# Patient Record
Sex: Male | Born: 1950 | Race: White | Hispanic: No | Smoking: Never smoker
Health system: Southern US, Community
[De-identification: ages and names within clinical notes are randomized; demographics above are authoritative.]

---

## 2020-09-06 ENCOUNTER — Emergency Department (HOSPITAL_COMMUNITY): Payer: Medicare Other

## 2020-09-06 ENCOUNTER — Inpatient Hospital Stay (HOSPITAL_COMMUNITY): Admission: EM | Disposition: E | Payer: Self-pay | Source: Home / Self Care | Attending: Cardiology

## 2020-09-06 ENCOUNTER — Inpatient Hospital Stay (HOSPITAL_COMMUNITY)
Admission: EM | Admit: 2020-09-06 | Discharge: 2020-10-19 | DRG: 270 | Disposition: E | Payer: Medicare Other | Attending: Cardiology | Admitting: Cardiology

## 2020-09-06 ENCOUNTER — Encounter (HOSPITAL_COMMUNITY): Payer: Self-pay

## 2020-09-06 ENCOUNTER — Inpatient Hospital Stay (HOSPITAL_COMMUNITY): Payer: Medicare Other

## 2020-09-06 ENCOUNTER — Other Ambulatory Visit: Payer: Self-pay

## 2020-09-06 DIAGNOSIS — I2109 ST elevation (STEMI) myocardial infarction involving other coronary artery of anterior wall: Secondary | ICD-10-CM | POA: Diagnosis not present

## 2020-09-06 DIAGNOSIS — J9602 Acute respiratory failure with hypercapnia: Secondary | ICD-10-CM | POA: Diagnosis not present

## 2020-09-06 DIAGNOSIS — I255 Ischemic cardiomyopathy: Secondary | ICD-10-CM | POA: Diagnosis present

## 2020-09-06 DIAGNOSIS — Z452 Encounter for adjustment and management of vascular access device: Secondary | ICD-10-CM | POA: Diagnosis not present

## 2020-09-06 DIAGNOSIS — I272 Pulmonary hypertension, unspecified: Secondary | ICD-10-CM | POA: Diagnosis not present

## 2020-09-06 DIAGNOSIS — R918 Other nonspecific abnormal finding of lung field: Secondary | ICD-10-CM | POA: Diagnosis not present

## 2020-09-06 DIAGNOSIS — G9340 Encephalopathy, unspecified: Secondary | ICD-10-CM | POA: Diagnosis not present

## 2020-09-06 DIAGNOSIS — E440 Moderate protein-calorie malnutrition: Secondary | ICD-10-CM | POA: Diagnosis not present

## 2020-09-06 DIAGNOSIS — J9601 Acute respiratory failure with hypoxia: Secondary | ICD-10-CM | POA: Diagnosis not present

## 2020-09-06 DIAGNOSIS — Z743 Need for continuous supervision: Secondary | ICD-10-CM | POA: Diagnosis not present

## 2020-09-06 DIAGNOSIS — J811 Chronic pulmonary edema: Secondary | ICD-10-CM | POA: Diagnosis not present

## 2020-09-06 DIAGNOSIS — I5021 Acute systolic (congestive) heart failure: Secondary | ICD-10-CM | POA: Diagnosis not present

## 2020-09-06 DIAGNOSIS — Z4659 Encounter for fitting and adjustment of other gastrointestinal appliance and device: Secondary | ICD-10-CM

## 2020-09-06 DIAGNOSIS — R0602 Shortness of breath: Secondary | ICD-10-CM | POA: Diagnosis not present

## 2020-09-06 DIAGNOSIS — G9341 Metabolic encephalopathy: Secondary | ICD-10-CM | POA: Diagnosis not present

## 2020-09-06 DIAGNOSIS — Z515 Encounter for palliative care: Secondary | ICD-10-CM | POA: Diagnosis not present

## 2020-09-06 DIAGNOSIS — Z978 Presence of other specified devices: Secondary | ICD-10-CM

## 2020-09-06 DIAGNOSIS — I4901 Ventricular fibrillation: Secondary | ICD-10-CM | POA: Diagnosis not present

## 2020-09-06 DIAGNOSIS — E785 Hyperlipidemia, unspecified: Secondary | ICD-10-CM | POA: Diagnosis not present

## 2020-09-06 DIAGNOSIS — I469 Cardiac arrest, cause unspecified: Secondary | ICD-10-CM

## 2020-09-06 DIAGNOSIS — R570 Cardiogenic shock: Secondary | ICD-10-CM | POA: Diagnosis not present

## 2020-09-06 DIAGNOSIS — I10 Essential (primary) hypertension: Secondary | ICD-10-CM | POA: Diagnosis present

## 2020-09-06 DIAGNOSIS — T17990A Other foreign object in respiratory tract, part unspecified in causing asphyxiation, initial encounter: Secondary | ICD-10-CM | POA: Diagnosis not present

## 2020-09-06 DIAGNOSIS — I462 Cardiac arrest due to underlying cardiac condition: Secondary | ICD-10-CM | POA: Diagnosis not present

## 2020-09-06 DIAGNOSIS — J14 Pneumonia due to Hemophilus influenzae: Secondary | ICD-10-CM | POA: Diagnosis not present

## 2020-09-06 DIAGNOSIS — Z20822 Contact with and (suspected) exposure to covid-19: Secondary | ICD-10-CM | POA: Diagnosis not present

## 2020-09-06 DIAGNOSIS — R739 Hyperglycemia, unspecified: Secondary | ICD-10-CM | POA: Diagnosis not present

## 2020-09-06 DIAGNOSIS — I499 Cardiac arrhythmia, unspecified: Secondary | ICD-10-CM | POA: Diagnosis not present

## 2020-09-06 DIAGNOSIS — I472 Ventricular tachycardia: Secondary | ICD-10-CM | POA: Diagnosis present

## 2020-09-06 DIAGNOSIS — R079 Chest pain, unspecified: Secondary | ICD-10-CM | POA: Diagnosis not present

## 2020-09-06 DIAGNOSIS — J9811 Atelectasis: Secondary | ICD-10-CM | POA: Diagnosis not present

## 2020-09-06 DIAGNOSIS — I509 Heart failure, unspecified: Secondary | ICD-10-CM

## 2020-09-06 DIAGNOSIS — J189 Pneumonia, unspecified organism: Secondary | ICD-10-CM

## 2020-09-06 DIAGNOSIS — R062 Wheezing: Secondary | ICD-10-CM | POA: Diagnosis not present

## 2020-09-06 DIAGNOSIS — E872 Acidosis: Secondary | ICD-10-CM | POA: Diagnosis not present

## 2020-09-06 DIAGNOSIS — R0902 Hypoxemia: Secondary | ICD-10-CM | POA: Diagnosis not present

## 2020-09-06 DIAGNOSIS — I110 Hypertensive heart disease with heart failure: Secondary | ICD-10-CM | POA: Diagnosis present

## 2020-09-06 DIAGNOSIS — R001 Bradycardia, unspecified: Secondary | ICD-10-CM | POA: Diagnosis not present

## 2020-09-06 DIAGNOSIS — Z09 Encounter for follow-up examination after completed treatment for conditions other than malignant neoplasm: Secondary | ICD-10-CM

## 2020-09-06 DIAGNOSIS — G931 Anoxic brain damage, not elsewhere classified: Secondary | ICD-10-CM | POA: Diagnosis present

## 2020-09-06 DIAGNOSIS — Z0189 Encounter for other specified special examinations: Secondary | ICD-10-CM

## 2020-09-06 DIAGNOSIS — R0789 Other chest pain: Secondary | ICD-10-CM | POA: Diagnosis not present

## 2020-09-06 DIAGNOSIS — R945 Abnormal results of liver function studies: Secondary | ICD-10-CM | POA: Diagnosis not present

## 2020-09-06 DIAGNOSIS — N179 Acute kidney failure, unspecified: Secondary | ICD-10-CM | POA: Diagnosis not present

## 2020-09-06 DIAGNOSIS — I517 Cardiomegaly: Secondary | ICD-10-CM | POA: Diagnosis not present

## 2020-09-06 DIAGNOSIS — R410 Disorientation, unspecified: Secondary | ICD-10-CM | POA: Diagnosis not present

## 2020-09-06 DIAGNOSIS — E870 Hyperosmolality and hypernatremia: Secondary | ICD-10-CM | POA: Diagnosis not present

## 2020-09-06 DIAGNOSIS — Z4682 Encounter for fitting and adjustment of non-vascular catheter: Secondary | ICD-10-CM | POA: Diagnosis not present

## 2020-09-06 DIAGNOSIS — R21 Rash and other nonspecific skin eruption: Secondary | ICD-10-CM | POA: Diagnosis not present

## 2020-09-06 DIAGNOSIS — R34 Anuria and oliguria: Secondary | ICD-10-CM | POA: Diagnosis not present

## 2020-09-06 DIAGNOSIS — N171 Acute kidney failure with acute cortical necrosis: Secondary | ICD-10-CM | POA: Diagnosis not present

## 2020-09-06 DIAGNOSIS — J90 Pleural effusion, not elsewhere classified: Secondary | ICD-10-CM | POA: Diagnosis not present

## 2020-09-06 DIAGNOSIS — R0603 Acute respiratory distress: Secondary | ICD-10-CM | POA: Diagnosis not present

## 2020-09-06 DIAGNOSIS — I213 ST elevation (STEMI) myocardial infarction of unspecified site: Secondary | ICD-10-CM | POA: Diagnosis not present

## 2020-09-06 DIAGNOSIS — J984 Other disorders of lung: Secondary | ICD-10-CM | POA: Diagnosis not present

## 2020-09-06 DIAGNOSIS — N170 Acute kidney failure with tubular necrosis: Secondary | ICD-10-CM | POA: Diagnosis not present

## 2020-09-06 DIAGNOSIS — G7281 Critical illness myopathy: Secondary | ICD-10-CM | POA: Diagnosis not present

## 2020-09-06 DIAGNOSIS — Z6826 Body mass index (BMI) 26.0-26.9, adult: Secondary | ICD-10-CM

## 2020-09-06 DIAGNOSIS — Z66 Do not resuscitate: Secondary | ICD-10-CM | POA: Diagnosis not present

## 2020-09-06 DIAGNOSIS — R6889 Other general symptoms and signs: Secondary | ICD-10-CM | POA: Diagnosis not present

## 2020-09-06 DIAGNOSIS — I2102 ST elevation (STEMI) myocardial infarction involving left anterior descending coronary artery: Secondary | ICD-10-CM | POA: Diagnosis present

## 2020-09-06 DIAGNOSIS — E876 Hypokalemia: Secondary | ICD-10-CM | POA: Diagnosis not present

## 2020-09-06 DIAGNOSIS — K5900 Constipation, unspecified: Secondary | ICD-10-CM | POA: Diagnosis not present

## 2020-09-06 DIAGNOSIS — I25110 Atherosclerotic heart disease of native coronary artery with unstable angina pectoris: Secondary | ICD-10-CM | POA: Diagnosis present

## 2020-09-06 DIAGNOSIS — R069 Unspecified abnormalities of breathing: Secondary | ICD-10-CM

## 2020-09-06 DIAGNOSIS — E162 Hypoglycemia, unspecified: Secondary | ICD-10-CM | POA: Diagnosis not present

## 2020-09-06 DIAGNOSIS — Z955 Presence of coronary angioplasty implant and graft: Secondary | ICD-10-CM | POA: Diagnosis not present

## 2020-09-06 HISTORY — PX: CORONARY/GRAFT ACUTE MI REVASCULARIZATION: CATH118305

## 2020-09-06 HISTORY — PX: IABP INSERTION: CATH118242

## 2020-09-06 HISTORY — PX: LEFT HEART CATH AND CORONARY ANGIOGRAPHY: CATH118249

## 2020-09-06 LAB — CBC WITH DIFFERENTIAL/PLATELET
Abs Immature Granulocytes: 0.02 10*3/uL (ref 0.00–0.07)
Basophils Absolute: 0.1 10*3/uL (ref 0.0–0.1)
Basophils Relative: 1 %
Eosinophils Absolute: 0.3 10*3/uL (ref 0.0–0.5)
Eosinophils Relative: 3 %
HCT: 38.3 % — ABNORMAL LOW (ref 39.0–52.0)
Hemoglobin: 13.8 g/dL (ref 13.0–17.0)
Immature Granulocytes: 0 %
Lymphocytes Relative: 40 %
Lymphs Abs: 3 10*3/uL (ref 0.7–4.0)
MCH: 34.3 pg — ABNORMAL HIGH (ref 26.0–34.0)
MCHC: 36 g/dL (ref 30.0–36.0)
MCV: 95.3 fL (ref 80.0–100.0)
Monocytes Absolute: 0.7 10*3/uL (ref 0.1–1.0)
Monocytes Relative: 10 %
Neutro Abs: 3.3 10*3/uL (ref 1.7–7.7)
Neutrophils Relative %: 46 %
Platelets: 361 10*3/uL (ref 150–400)
RBC: 4.02 MIL/uL — ABNORMAL LOW (ref 4.22–5.81)
RDW: 13.8 % (ref 11.5–15.5)
WBC: 7.3 10*3/uL (ref 4.0–10.5)
nRBC: 0 % (ref 0.0–0.2)

## 2020-09-06 LAB — COMPREHENSIVE METABOLIC PANEL
ALT: 18 U/L (ref 0–44)
AST: 26 U/L (ref 15–41)
Albumin: 3.5 g/dL (ref 3.5–5.0)
Alkaline Phosphatase: 97 U/L (ref 38–126)
Anion gap: 9 (ref 5–15)
BUN: 13 mg/dL (ref 8–23)
CO2: 22 mmol/L (ref 22–32)
Calcium: 8.2 mg/dL — ABNORMAL LOW (ref 8.9–10.3)
Chloride: 107 mmol/L (ref 98–111)
Creatinine, Ser: 1.21 mg/dL (ref 0.61–1.24)
GFR, Estimated: >60 mL/min (ref >60)
Glucose, Bld: 127 mg/dL — ABNORMAL HIGH (ref 70–99)
Potassium: 3.2 mmol/L — ABNORMAL LOW (ref 3.5–5.1)
Sodium: 138 mmol/L (ref 135–145)
Total Bilirubin: 0.6 mg/dL (ref 0.3–1.2)
Total Protein: 6.3 g/dL — ABNORMAL LOW (ref 6.5–8.1)

## 2020-09-06 LAB — TSH: TSH: 2.235 u[IU]/mL (ref 0.350–4.500)

## 2020-09-06 LAB — LIPID PANEL
Cholesterol: 182 mg/dL (ref 0–200)
HDL: 31 mg/dL — ABNORMAL LOW (ref >40)
LDL Cholesterol: 106 mg/dL — ABNORMAL HIGH (ref 0–99)
Total CHOL/HDL Ratio: 5.9 RATIO
Triglycerides: 225 mg/dL — ABNORMAL HIGH (ref <150)
VLDL: 45 mg/dL — ABNORMAL HIGH (ref 0–40)

## 2020-09-06 LAB — TROPONIN I (HIGH SENSITIVITY)
Troponin I (High Sensitivity): 29 ng/L — ABNORMAL HIGH (ref <18)
Troponin I (High Sensitivity): >24000 ng/L (ref <18)

## 2020-09-06 LAB — PROTIME-INR
INR: 1.1 (ref 0.8–1.2)
Prothrombin Time: 14.2 seconds (ref 11.4–15.2)

## 2020-09-06 LAB — APTT: aPTT: 84 seconds — ABNORMAL HIGH (ref 24–36)

## 2020-09-06 LAB — MAGNESIUM: Magnesium: 2.2 mg/dL (ref 1.7–2.4)

## 2020-09-06 SURGERY — CORONARY/GRAFT ACUTE MI REVASCULARIZATION
Anesthesia: LOCAL

## 2020-09-06 MED ORDER — EPINEPHRINE HCL 5 MG/250ML IV SOLN IN NS
0.5000 ug/min | INTRAVENOUS | Status: DC
  Administered 2020-09-06: 5 ug/min via INTRAVENOUS
  Filled 2020-09-06 (×2): qty 250

## 2020-09-06 MED ORDER — SODIUM CHLORIDE 0.9 % IV SOLN: 250.0000 mL | INTRAVENOUS | Status: DC

## 2020-09-06 MED ORDER — LIDOCAINE HCL (PF) 1 % IJ SOLN
INTRAMUSCULAR | Status: DC | PRN
  Administered 2020-09-06: 2 mL via INTRADERMAL
  Administered 2020-09-06: 30 mL via INTRADERMAL

## 2020-09-06 MED ORDER — LIDOCAINE HCL (PF) 1 % IJ SOLN
INTRAMUSCULAR | Status: AC
  Filled 2020-09-06: qty 30

## 2020-09-06 MED ORDER — TICAGRELOR 90 MG PO TABS
ORAL_TABLET | ORAL | Status: AC
  Filled 2020-09-06: qty 2

## 2020-09-06 MED ORDER — CHLORHEXIDINE GLUCONATE CLOTH 2 % EX PADS
6.0000 | MEDICATED_PAD | Freq: Every day | CUTANEOUS | Status: DC
  Administered 2020-09-06 – 2020-09-16 (×11): 6 via TOPICAL

## 2020-09-06 MED ORDER — ONDANSETRON HCL 4 MG/2ML IJ SOLN
4.0000 mg | Freq: Four times a day (QID) | INTRAMUSCULAR | Status: DC | PRN
  Administered 2020-09-07: 4 mg via INTRAVENOUS
  Filled 2020-09-06: qty 2

## 2020-09-06 MED ORDER — HEPARIN (PORCINE) 25000 UT/250ML-% IV SOLN: 12.0000 [IU]/kg/h | INTRAVENOUS | Status: DC

## 2020-09-06 MED ORDER — TICAGRELOR 90 MG PO TABS
ORAL_TABLET | ORAL | Status: DC | PRN
  Administered 2020-09-06: 180 mg via ORAL

## 2020-09-06 MED ORDER — ATORVASTATIN CALCIUM 80 MG PO TABS
80.0000 mg | ORAL_TABLET | Freq: Every day | ORAL | Status: DC
  Administered 2020-09-07: 80 mg via ORAL
  Filled 2020-09-06: qty 1

## 2020-09-06 MED ORDER — HEPARIN (PORCINE) IN NACL 1000-0.9 UT/500ML-% IV SOLN
INTRAVENOUS | Status: AC
  Filled 2020-09-06: qty 500

## 2020-09-06 MED ORDER — SODIUM CHLORIDE 0.9 % IV SOLN: 250.0000 mL | INTRAVENOUS | Status: DC | PRN

## 2020-09-06 MED ORDER — VERAPAMIL HCL 2.5 MG/ML IV SOLN
INTRAVENOUS | Status: DC | PRN
  Administered 2020-09-06: 10 mL via INTRA_ARTERIAL

## 2020-09-06 MED ORDER — ASPIRIN 81 MG PO CHEW
81.0000 mg | CHEWABLE_TABLET | Freq: Every day | ORAL | Status: DC
  Administered 2020-09-07: 81 mg via ORAL
  Filled 2020-09-06: qty 1

## 2020-09-06 MED ORDER — NITROGLYCERIN 1 MG/10 ML FOR IR/CATH LAB
INTRA_ARTERIAL | Status: AC
  Filled 2020-09-06: qty 10

## 2020-09-06 MED ORDER — HEPARIN (PORCINE) IN NACL 1000-0.9 UT/500ML-% IV SOLN
INTRAVENOUS | Status: DC | PRN
  Administered 2020-09-06 (×3): 500 mL

## 2020-09-06 MED ORDER — TIROFIBAN (AGGRASTAT) BOLUS VIA INFUSION
25.0000 ug/kg | Freq: Once | INTRAVENOUS | Status: DC
  Filled 2020-09-06: qty 46

## 2020-09-06 MED ORDER — HEPARIN SODIUM (PORCINE) 1000 UNIT/ML IJ SOLN
INTRAMUSCULAR | Status: DC | PRN
  Administered 2020-09-06: 8000 [IU] via INTRAVENOUS

## 2020-09-06 MED ORDER — AMIODARONE HCL 150 MG/3ML IV SOLN
INTRAVENOUS | Status: AC | PRN
  Administered 2020-09-06: 300 mg via INTRAVENOUS

## 2020-09-06 MED ORDER — HEPARIN SODIUM (PORCINE) 1000 UNIT/ML IJ SOLN
INTRAMUSCULAR | Status: AC
  Filled 2020-09-06: qty 1

## 2020-09-06 MED ORDER — ACETAMINOPHEN 325 MG PO TABS: 650.0000 mg | ORAL_TABLET | ORAL | Status: DC | PRN

## 2020-09-06 MED ORDER — VERAPAMIL HCL 2.5 MG/ML IV SOLN
INTRAVENOUS | Status: AC
  Filled 2020-09-06: qty 2

## 2020-09-06 MED ORDER — MIDAZOLAM HCL 2 MG/2ML IJ SOLN
INTRAMUSCULAR | Status: AC
  Filled 2020-09-06: qty 2

## 2020-09-06 MED ORDER — TIROFIBAN (AGGRASTAT) BOLUS VIA INFUSION
INTRAVENOUS | Status: DC | PRN
  Administered 2020-09-06: 2267.5 ug via INTRAVENOUS

## 2020-09-06 MED ORDER — SODIUM CHLORIDE 0.9 % IV SOLN: INTRAVENOUS | Status: DC

## 2020-09-06 MED ORDER — HEPARIN (PORCINE) IN NACL 1000-0.9 UT/500ML-% IV SOLN
INTRAVENOUS | Status: AC
  Filled 2020-09-06: qty 1000

## 2020-09-06 MED ORDER — OXYCODONE HCL 5 MG PO TABS: 5.0000 mg | ORAL_TABLET | ORAL | Status: DC | PRN

## 2020-09-06 MED ORDER — EPINEPHRINE 1 MG/10ML IJ SOSY
PREFILLED_SYRINGE | INTRAMUSCULAR | Status: AC | PRN
  Administered 2020-09-06: 1 mg via INTRAVENOUS

## 2020-09-06 MED ORDER — SODIUM BICARBONATE 8.4 % IV SOLN
INTRAVENOUS | Status: AC | PRN
  Administered 2020-09-06: 100 meq via INTRAVENOUS

## 2020-09-06 MED ORDER — NOREPINEPHRINE 4 MG/250ML-% IV SOLN
2.0000 ug/min | INTRAVENOUS | Status: DC
  Administered 2020-09-06: 1 ug/min via INTRAVENOUS

## 2020-09-06 MED ORDER — NOREPINEPHRINE 4 MG/250ML-% IV SOLN: 0.0000 ug/min | INTRAVENOUS | Status: DC

## 2020-09-06 MED ORDER — SODIUM CHLORIDE 0.9 % WEIGHT BASED INFUSION
1.0000 mL/kg/h | INTRAVENOUS | Status: AC
  Administered 2020-09-06 (×2): 1 mL/kg/h via INTRAVENOUS

## 2020-09-06 MED ORDER — NOREPINEPHRINE BITARTRATE 1 MG/ML IV SOLN
INTRAVENOUS | Status: AC | PRN
  Administered 2020-09-06: 10 ug/min via INTRAVENOUS

## 2020-09-06 MED ORDER — SODIUM CHLORIDE 0.9% FLUSH
3.0000 mL | INTRAVENOUS | Status: DC | PRN
  Administered 2020-09-21: 3 mL via INTRAVENOUS

## 2020-09-06 MED ORDER — SODIUM CHLORIDE 0.9% FLUSH
3.0000 mL | Freq: Two times a day (BID) | INTRAVENOUS | Status: DC
  Administered 2020-09-07 – 2020-09-20 (×18): 3 mL via INTRAVENOUS

## 2020-09-06 MED ORDER — TICAGRELOR 90 MG PO TABS
90.0000 mg | ORAL_TABLET | Freq: Two times a day (BID) | ORAL | Status: DC
  Administered 2020-09-07: 90 mg via ORAL
  Filled 2020-09-06: qty 1

## 2020-09-06 MED ORDER — POTASSIUM CHLORIDE ER 10 MEQ PO TBCR
20.0000 meq | EXTENDED_RELEASE_TABLET | ORAL | Status: AC
  Administered 2020-09-06 (×2): 20 meq via ORAL
  Filled 2020-09-06 (×4): qty 2

## 2020-09-06 MED ORDER — IOHEXOL 350 MG/ML SOLN
INTRAVENOUS | Status: DC | PRN
  Administered 2020-09-06: 115 mL via INTRA_ARTERIAL

## 2020-09-06 MED ORDER — HEPARIN (PORCINE) 25000 UT/250ML-% IV SOLN
1000.0000 [IU]/h | INTRAVENOUS | Status: DC
  Administered 2020-09-06: 1000 [IU]/h via INTRAVENOUS
  Filled 2020-09-06: qty 250

## 2020-09-06 MED ORDER — NITROGLYCERIN 0.4 MG SL SUBL
0.4000 mg | SUBLINGUAL_TABLET | SUBLINGUAL | Status: DC | PRN
  Administered 2020-09-07: 0.4 mg via SUBLINGUAL
  Filled 2020-09-06: qty 1

## 2020-09-06 MED ORDER — HEPARIN SODIUM (PORCINE) 5000 UNIT/ML IJ SOLN
60.0000 [IU]/kg | Freq: Once | INTRAMUSCULAR | Status: AC
  Administered 2020-09-06: 4000 [IU] via INTRAVENOUS

## 2020-09-06 MED ORDER — TIROFIBAN HCL IN NACL 5-0.9 MG/100ML-% IV SOLN
0.1500 ug/kg/min | INTRAVENOUS | Status: DC
  Filled 2020-09-06 (×4): qty 100

## 2020-09-06 MED ORDER — ZOLPIDEM TARTRATE 5 MG PO TABS
5.0000 mg | ORAL_TABLET | Freq: Every evening | ORAL | Status: DC | PRN
  Administered 2020-09-07: 5 mg via ORAL
  Filled 2020-09-06: qty 1

## 2020-09-06 MED ORDER — TIROFIBAN HCL IN NACL 5-0.9 MG/100ML-% IV SOLN
INTRAVENOUS | Status: AC | PRN
  Administered 2020-09-06: 0.15 ug/kg/min via INTRAVENOUS

## 2020-09-06 SURGICAL SUPPLY — 27 items
BALLN IABP SENSA PLUS 7.5F 40C (BALLOONS) ×2
BALLN SAPPHIRE 3.0X20 (BALLOONS) ×2
BALLOON IABP SENS PLUS 7.5F40C (BALLOONS) ×1 IMPLANT
BALLOON SAPPHIRE 3.0X20 (BALLOONS) ×1 IMPLANT
CATH INFINITI JR4 5F (CATHETERS) ×2 IMPLANT
CATH LAUNCHER 6FR JL3.5 (CATHETERS) ×2 IMPLANT
DEVICE RAD COMP TR BAND LRG (VASCULAR PRODUCTS) ×2 IMPLANT
ELECT DEFIB PAD ADLT CADENCE (PAD) ×2 IMPLANT
GLIDESHEATH SLEND A-KIT 6F 22G (SHEATH) ×2 IMPLANT
GUIDEWIRE INQWIRE 1.5J.035X260 (WIRE) ×1 IMPLANT
INQWIRE 1.5J .035X260CM (WIRE) ×2
KIT ENCORE 26 ADVANTAGE (KITS) ×2 IMPLANT
KIT HEART LEFT (KITS) ×2 IMPLANT
KIT MICROPUNCTURE NIT STIFF (SHEATH) ×2 IMPLANT
PACK CARDIAC CATHETERIZATION (CUSTOM PROCEDURE TRAY) ×2 IMPLANT
SHEATH PINNACLE 6F 10CM (SHEATH) ×2 IMPLANT
STENT SYNERGY XD 3.0X16 (Permanent Stent) ×1 IMPLANT
STENT SYNERGY XD 3.0X24 (Permanent Stent) ×1 IMPLANT
SYNERGY XD 3.0X16 (Permanent Stent) ×2 IMPLANT
SYNERGY XD 3.0X24 (Permanent Stent) ×2 IMPLANT
TRANSDUCER W/STOPCOCK (MISCELLANEOUS) ×2 IMPLANT
TUBING CIL FLEX 10 FLL-RA (TUBING) ×2 IMPLANT
VALVE COPILOT STAT (MISCELLANEOUS) ×2 IMPLANT
WIRE AMPLATZ SS-J .035X180CM (WIRE) IMPLANT
WIRE AMPLATZ SS-J .035X260CM (WIRE) ×2 IMPLANT
WIRE COUGAR XT STRL 190CM (WIRE) ×2 IMPLANT
WIRE EMERALD 3MM-J .035X150CM (WIRE) ×2 IMPLANT

## 2020-09-06 NOTE — Progress Notes (Signed)
ANTICOAGULATION CONSULT NOTE - Initial Consult  Pharmacy Consult for heparin Indication:  IABP  No Known Allergies  Patient Measurements: Height: 5\' 9"  (175.3 cm) Weight: 90.7 kg (200 lb) IBW/kg (Calculated) : 70.7 Heparin Dosing Weight: 90kg  Vital Signs: Temp: 97.6 F (36.4 C) (06/19 1556) Temp Source: Oral (06/19 1556) BP: 96/73 (06/19 1556) Pulse Rate: 85 (06/19 1556)  Labs: Recent Labs    09/14/20 1551  HGB 13.8  HCT 38.3*  PLT 361  APTT 84*  LABPROT 14.2  INR 1.1    CrCl cannot be calculated (No successful lab value found.).   Medical History: History reviewed. No pertinent past medical history.  Medications:  No medications prior to admission.    Assessment: 70 year old male presenting to [facility] with chest pain, code stemi initiated. Patient now s/p stent to LAD and IABP inserted for support. No anticoagulants prior to admit. CBC appears normal  Goal of Therapy:  Heparin level 0.2-0.5 units/ml Monitor platelets by anticoagulation protocol: Yes   Plan:  Start heparin infusion at 1000 units/hr Check anti-Xa level in 8 hours and daily while on heparin Continue to monitor H&H and platelets  [facility] PharmD., BCPS Clinical Pharmacist Sep 14, 2020 5:57 PM

## 2020-09-06 NOTE — ED Provider Notes (Signed)
[facility] EMERGENCY DEPARTMENT Provider Note   CSN: [id number] Arrival date & time: 09/18/20  1546     History Chief Complaint  Patient presents with   Code STEMI    Zacharee Gaddie is a 70 y.o. male.  The history is provided by the patient and the EMS personnel.  Chest Pain Pain location:  Substernal area Pain quality: crushing and pressure   Pain radiates to:  Does not radiate Pain severity:  Severe Onset quality:  Sudden Duration:  1 hour Timing:  Constant Progression:  Worsening Chronicity:  New Context: at rest   Context: not breathing, not drug use, not eating, not intercourse, not lifting, not movement, not raising an arm, not stress and not trauma   Relieved by:  Nitroglycerin Worsened by:  Nothing Ineffective treatments:  None tried Associated symptoms: nausea   Associated symptoms: no abdominal pain, no AICD problem, no altered mental status, no anorexia, no anxiety, no back pain, no claudication, no cough, no diaphoresis, no dizziness, no dysphagia, no fatigue, no fever, no headache, no heartburn, no lower extremity edema, no near-syncope, no numbness, no orthopnea, no palpitations, no PND, no shortness of breath, no syncope, no vomiting and no weakness   Risk factors: male sex   Risk factors: no aortic disease, no coronary artery disease, no diabetes mellitus, no high cholesterol, no hypertension, not obese and no prior DVT/PE    Arrived as CODE STEMI. Acute onset crushing CP about 1 hour ago. Was sitting watching TV. No pmhx reported. No medications reported. No allergies. EMS gave 325 ASA and 1 SL nitro with improvement in pain slightly.     History reviewed. No pertinent past medical history.  There are no problems to display for this patient.   History reviewed. No pertinent surgical history.     History reviewed. No pertinent family history.  Social History   Tobacco Use   Smoking status: Never   Smokeless tobacco: Never   Substance Use Topics   Alcohol use: Never   Drug use: Never    Home Medications Prior to Admission medications   Not on File    Allergies    Patient has no known allergies.  Review of Systems   Review of Systems  Constitutional:  Negative for chills, diaphoresis, fatigue and fever.  HENT:  Negative for ear pain, sore throat and trouble swallowing.   Eyes:  Negative for pain and visual disturbance.  Respiratory:  Negative for cough and shortness of breath.   Cardiovascular:  Positive for chest pain. Negative for palpitations, orthopnea, claudication, syncope, PND and near-syncope.  Gastrointestinal:  Positive for nausea. Negative for abdominal pain, anorexia, heartburn and vomiting.  Genitourinary:  Negative for dysuria and hematuria.  Musculoskeletal:  Negative for arthralgias and back pain.  Skin:  Negative for color change and rash.  Neurological:  Negative for dizziness, seizures, syncope, weakness, numbness and headaches.  All other systems reviewed and are negative.  Physical Exam Updated Vital Signs BP 96/73 (BP Location: Right Arm)   Pulse 85   Temp 97.6 F (36.4 C) (Oral)   Resp 18   Ht 5\' 9"  (1.753 m)   Wt 90.7 kg   SpO2 98%   BMI 29.53 kg/m   Physical Exam Vitals and nursing note reviewed.  Constitutional:      Appearance: He is well-developed and overweight. He is ill-appearing and toxic-appearing.     Interventions: Nasal cannula in place.  HENT:     Head: Normocephalic and atraumatic.  Eyes:     Extraocular Movements: Extraocular movements intact.     Conjunctiva/sclera: Conjunctivae normal.  Cardiovascular:     Rate and Rhythm: Normal rate and regular rhythm.     Pulses:          Radial pulses are 2+ on the right side and 2+ on the left side.       Dorsalis pedis pulses are 2+ on the right side and 2+ on the left side.     Heart sounds: Normal heart sounds. No murmur heard. Pulmonary:     Effort: Pulmonary effort is normal. No tachypnea,  accessory muscle usage or respiratory distress.     Breath sounds: Normal breath sounds. No decreased air movement.  Abdominal:     Palpations: Abdomen is soft.     Tenderness: There is no abdominal tenderness.  Musculoskeletal:     Cervical back: Neck supple.     Right lower leg: No edema.     Left lower leg: No edema.  Skin:    General: Skin is warm and moist.     Capillary Refill: Capillary refill takes less than 2 seconds.     Coloration: Skin is ashen and pale.  Neurological:     General: No focal deficit present.     Mental Status: He is alert and oriented to person, place, and time.     GCS: GCS eye subscore is 4. GCS verbal subscore is 5. GCS motor subscore is 6.    ED Results / Procedures / Treatments   Labs (all labs ordered are listed, but only abnormal results are displayed) Labs Reviewed  RESP PANEL BY RT-PCR (FLU A&B, COVID) ARPGX2  HEMOGLOBIN A1C  CBC WITH DIFFERENTIAL/PLATELET  PROTIME-INR  APTT  COMPREHENSIVE METABOLIC PANEL  LIPID PANEL  MAGNESIUM  TROPONIN I (HIGH SENSITIVITY)    EKG None  Radiology No results found.  Procedures Procedures   Medications Ordered in ED Medications  0.9 %  sodium chloride infusion (has no administration in time range)  nitroGLYCERIN (NITROSTAT) SL tablet 0.4 mg ( Sublingual MAR Hold 08/31/2020 1616)  tirofiban (AGGRASTAT) bolus via infusion 2,267.5 mcg ( Intravenous MAR Hold 09/15/2020 1616)  tirofiban (AGGRASTAT) infusion 50 mcg/mL 100 mL (has no administration in time range)  Heparin (Porcine) in NaCl 1000-0.9 UT/500ML-% SOLN (500 mLs  Given 09/14/2020 1624)  heparin sodium (porcine) injection (8,000 Units Intravenous Given 09/04/2020 1624)  EPINEPHrine (ADRENALIN) 5 mg in NS 250 mL (0.02 mg/mL) premix infusion (has no administration in time range)  lidocaine (PF) (XYLOCAINE) 1 % injection (2 mLs Intradermal Given 09/10/2020 1625)  Radial Cocktail/Verapamil only (10 mLs Intra-arterial Given 09/11/2020 1633)  heparin injection 60  Units/kg (4,000 Units Intravenous Given 08/29/2020 1550)  EPINEPHrine (ADRENALIN) 1 MG/10ML injection (1 mg Intravenous Given 09/04/2020 1559)  amiodarone (CORDARONE) injection (300 mg Intravenous Given 08/31/2020 1601)  sodium bicarbonate injection (100 mEq Intravenous Given 09/08/2020 1601)    ED Course  I have reviewed the triage vital signs and the nursing notes.  Pertinent labs & imaging results that were available during my care of the patient were reviewed by me and considered in my medical decision making (see chart for details).    MDM Rules/Calculators/A&P                          This is a 70 year old male with no reported past medical history who presented to the emergency department via EMS as a code STEMI after developing acute  onset chest pain while at rest approximately 1 hour prior to arrival.  EMS transmitted his EKG which was consistent with anterior lateral STEMI. He received 325 ASA as well as 1 sublingual nitroglycerin prior to arrival. In the emergency department he was initially hemodynamically stable though toxic appearing and pale.  He had a nonfocal neurologic exam and his GCS was 15.  He was given IV heparin bolus and started on heparin infusion. After a short period of time in the emergency department he had an acute change in mental status, lost pulses and monitor showed ventricular fibrillation. He was defibrillated and CPR was started. He received 1 round of CPR including 1 mg of epinephrine, then subsequently had ROSC.  Loaded with amiodarone.  Is mental status improved significantly.  He was able to talk and communicate with the team.  He was protecting his airway.  He was not hypoxic. Cardiology arrived to bedside.  He was started on epinephrine infusion.  Patient was transported rapidly to Cath Lab where he continued to have a GCS of 15 and remained hemodynamically stable. Handoff given to Cath Lab team by myself in person.   Final Clinical Impression(s) /  ED Diagnoses Final diagnoses:  ST elevation myocardial infarction (STEMI), unspecified artery [facility]  Cardiac arrest [facility])    Rx / DC Orders ED Discharge Orders     None        Ardeen Fillers, DO 09-29-20 1640    Margarita Grizzle, MD 09/07/20 0002

## 2020-09-06 NOTE — H&P (Signed)
CARDIOLOGY ADMIT NOTE   Patient ID: Jesse Lowe MRN: [id number] DOB/AGE: 03/25/1950 70 y.o.  Admit date: 09/03/2020 Primary Physician:  No primary care provider on file.  Patient ID: Jesse Lowe, male    DOB: May 03, 1950, 70 y.o.   MRN: [id number]  Chief Complaint  Patient presents with   Code STEMI   HPI:    [name]  is a 70 y.o. Caucasian male patient with no significant prior cardiovascular history, as noted he has not seen a physician in greater than 10 to 15 years.  He was doing well at home and suddenly developed severe crushing retrosternal chest pain 1 hour prior to admission to the emergency room, EMS was activated and brought in as a STEMI.  While patient in the emergency room, his intensity of chest pain got worse and suddenly had V. fib cardiac arrest needing resuscitation and ACLS protocol, received 300 mg of IV amiodarone, 1 ampoule of bicarbonate and also 1 ampoule of epinephrine.  After approximately 5 minutes of CPR, he returned to spontaneous respiration and was awake and alert.  He is now brought emergently to the cardiac catheterization lab for cardiac catheterization.  History reviewed. No pertinent past medical history. History reviewed. No pertinent surgical history. Social History   Socioeconomic History   Marital status: Not on file    Spouse name: Not on file   Number of children: Not on file   Years of education: Not on file   Highest education level: Not on file  Occupational History   Not on file  Tobacco Use   Smoking status: Never   Smokeless tobacco: Never  Substance and Sexual Activity   Alcohol use: Never   Drug use: Never   Sexual activity: Not on file  Other Topics Concern   Not on file  Social History Narrative   Not on file   Social Determinants of Health   Financial Resource Strain: Not on file  Food Insecurity: Not on file  Transportation Needs: Not on file  Physical Activity: Not on file  Stress: Not on file  Social  Connections: Not on file  Intimate Partner Violence: Not on file   History reviewed. No pertinent family history.   ROS  Review of Systems  Cardiovascular:  Positive for chest pain. Negative for dyspnea on exertion and leg swelling.  Gastrointestinal:  Negative for melena.  All other systems reviewed and are negative. Objective   Vitals with BMI 09/13/2020 08/25/2020  Height - 5\' 9"   Weight - 200 lbs  BMI - 29.52  Systolic 96 -  Diastolic 73 -  Pulse 85 -      Physical Exam Constitutional:      General: He is in acute distress.     Appearance: He is diaphoretic.  HENT:     Head: Atraumatic.     Mouth/Throat:     Mouth: Mucous membranes are moist.  Eyes:     Extraocular Movements: Extraocular movements intact.  Cardiovascular:     Rate and Rhythm: Tachycardia present.     Pulses:          Carotid pulses are 2+ on the right side and 2+ on the left side.      Femoral pulses are 2+ on the right side and 2+ on the left side.      Dorsalis pedis pulses are 0 on the right side and 0 on the left side.       Posterior tibial pulses are 0 on the right  side and 0 on the left side.     Heart sounds: Heart sounds are distant.     Comments: Cool periphery and no JVD Pulmonary:     Effort: Pulmonary effort is normal.     Breath sounds: Normal breath sounds.  Abdominal:     General: Abdomen is flat. Bowel sounds are normal.  Musculoskeletal:        General: No swelling.     Cervical back: Normal range of motion and neck supple.  Skin:    Capillary Refill: Capillary refill takes less than 2 seconds.     Coloration: Skin is ashen.  Neurological:     General: No focal deficit present.     Mental Status: He is alert and oriented to person, place, and time.   Laboratory examination:   Recent Labs    2020/09/15 1551  NA 138  K 3.2*  CL 107  CO2 22  GLUCOSE 127*  BUN 13  CREATININE 1.21  CALCIUM 8.2*  GFRNONAA >60   estimated creatinine clearance is 65 mL/min (by C-G  formula based on SCr of 1.21 mg/dL).  CMP Latest Ref Rng & Units 15-Sep-2020  Glucose 70 - 99 mg/dL 277(A)  BUN 8 - 23 mg/dL 13  Creatinine 1.28 - 7.86 mg/dL 7.67  Sodium 209 - 470 mmol/L 138  Potassium 3.5 - 5.1 mmol/L 3.2(L)  Chloride 98 - 111 mmol/L 107  CO2 22 - 32 mmol/L 22  Calcium 8.9 - 10.3 mg/dL 8.2(L)  Total Protein 6.5 - 8.1 g/dL 6.3(L)  Total Bilirubin 0.3 - 1.2 mg/dL 0.6  Alkaline Phos 38 - 126 U/L 97  AST 15 - 41 U/L 26  ALT 0 - 44 U/L 18   CBC Latest Ref Rng & Units 09-15-2020  WBC 4.0 - 10.5 K/uL 7.3  Hemoglobin 13.0 - 17.0 g/dL 96.2  Hematocrit 83.6 - 52.0 % 38.3(L)  Platelets 150 - 400 K/uL 361   Lipid Panel  No results found for: CHOL, TRIG, HDL, CHOLHDL, VLDL, LDLCALC, LDLDIRECT HEMOGLOBIN A1C No results found for: HGBA1C, MPG TSH No results for input(s): TSH in the last 8760 hours. BNP (last 3 results) No results for input(s): BNP in the last 8760 hours.  Medications and allergies  No Known Allergies   Prior to Admission medications   Not on File  None   sodium chloride     sodium chloride     epinephrine Stopped (09-15-20 1650)   heparin     norepinephrine (LEVOPHED) 4mg  / infusion 5 mcg/min (September 15, 2020 1710)   norepinephrine (LEVOPHED) Adult infusion     tirofiban     tirofiban 0.15 mcg/kg/min (15-Sep-2020 1647)   No current outpatient medications  Radiology:  No results found. CXR pending  Cardiac Studies:   EKG 2020/09/15: 1557 hrs.: Ventricular fibrillation.  EKG 2020/09/15 at 1550 hrs.: Anteroseptal and high lateral STEMI.   Assessment  1.  Acute anterior and lateral STEMI, ST elevation MI. 2.  V. fib arrest 3.  Cardiogenic shock  Recommendations:   Patient was stabilized in the emergency room, I was present during the CPR and assisted with the team, after stabilization he was emergently being rushed to the cardiac catheterization lab for further management of cardiogenic shock, anterior STEMI.  Rhythm has been stabilized.  I  spent a total of 20 to 25 minutes for stabilization of the patient during the entire transfer and in the cardiac catheterization lab prior to procedure.   09/08/2020, MD, Physician [facility] 09/15/20, 5:40 PM  Office: [phone] Fax: [phone] Pager: [phone]

## 2020-09-06 NOTE — Progress Notes (Signed)
1815- Patient admitted to 2H from Cath lab, report received from Cath lab team.  Monitors applied, IABP in place.  CHG bath performed, skin intact, no wounds noted. Knee immobilizer placed on right leg.

## 2020-09-06 NOTE — Code Documentation (Signed)
Shocked at 200 J

## 2020-09-06 NOTE — Code Documentation (Signed)
Pulse check, no pulse.  

## 2020-09-06 NOTE — ED Triage Notes (Signed)
Pt BIB [facility] from home w/acute onset of chest pain. Denies N/V, SOB, dizziness. No cardiac hx   Pt arrives pale and clammy  18G LAC   BP 130/84 HR 70 95% RA

## 2020-09-07 ENCOUNTER — Inpatient Hospital Stay (HOSPITAL_COMMUNITY): Payer: Medicare Other

## 2020-09-07 ENCOUNTER — Other Ambulatory Visit (HOSPITAL_COMMUNITY): Payer: Self-pay

## 2020-09-07 ENCOUNTER — Encounter (HOSPITAL_COMMUNITY): Payer: Self-pay | Admitting: Cardiology

## 2020-09-07 DIAGNOSIS — I2102 ST elevation (STEMI) myocardial infarction involving left anterior descending coronary artery: Secondary | ICD-10-CM

## 2020-09-07 DIAGNOSIS — N179 Acute kidney failure, unspecified: Secondary | ICD-10-CM

## 2020-09-07 DIAGNOSIS — G9341 Metabolic encephalopathy: Secondary | ICD-10-CM

## 2020-09-07 LAB — CBC
HCT: 36.6 % — ABNORMAL LOW (ref 39.0–52.0)
Hemoglobin: 12.9 g/dL — ABNORMAL LOW (ref 13.0–17.0)
MCH: 32.1 pg (ref 26.0–34.0)
MCHC: 35.2 g/dL (ref 30.0–36.0)
MCV: 91 fL (ref 80.0–100.0)
Platelets: 324 10*3/uL (ref 150–400)
RBC: 4.02 MIL/uL — ABNORMAL LOW (ref 4.22–5.81)
RDW: 13.7 % (ref 11.5–15.5)
WBC: 10 10*3/uL (ref 4.0–10.5)
nRBC: 0 % (ref 0.0–0.2)

## 2020-09-07 LAB — POCT I-STAT 7, (LYTES, BLD GAS, ICA,H+H)
Acid-base deficit: 5 mmol/L — ABNORMAL HIGH (ref 0.0–2.0)
Acid-base deficit: 6 mmol/L — ABNORMAL HIGH (ref 0.0–2.0)
Bicarbonate: 16.4 mmol/L — ABNORMAL LOW (ref 20.0–28.0)
Bicarbonate: 17.5 mmol/L — ABNORMAL LOW (ref 20.0–28.0)
Calcium, Ion: 1.12 mmol/L — ABNORMAL LOW (ref 1.15–1.40)
Calcium, Ion: 1.13 mmol/L — ABNORMAL LOW (ref 1.15–1.40)
HCT: 37 % — ABNORMAL LOW (ref 39.0–52.0)
HCT: 38 % — ABNORMAL LOW (ref 39.0–52.0)
Hemoglobin: 12.6 g/dL — ABNORMAL LOW (ref 13.0–17.0)
Hemoglobin: 12.9 g/dL — ABNORMAL LOW (ref 13.0–17.0)
O2 Saturation: 90 %
O2 Saturation: 95 %
Patient temperature: 98.2
Potassium: 3.6 mmol/L (ref 3.5–5.1)
Potassium: 3.7 mmol/L (ref 3.5–5.1)
Sodium: 140 mmol/L (ref 135–145)
Sodium: 141 mmol/L (ref 135–145)
TCO2: 17 mmol/L — ABNORMAL LOW (ref 22–32)
TCO2: 18 mmol/L — ABNORMAL LOW (ref 22–32)
pCO2 arterial: 21.8 mmHg — ABNORMAL LOW (ref 32.0–48.0)
pCO2 arterial: 27.4 mmHg — ABNORMAL LOW (ref 32.0–48.0)
pH, Arterial: 7.484 — ABNORMAL HIGH (ref 7.350–7.450)
pH, Arterial: 7.414 (ref 7.350–7.450)
pO2, Arterial: 52 mmHg — ABNORMAL LOW (ref 83.0–108.0)
pO2, Arterial: 75 mmHg — ABNORMAL LOW (ref 83.0–108.0)

## 2020-09-07 LAB — PROTIME-INR
INR: 1.2 (ref 0.8–1.2)
Prothrombin Time: 14.8 seconds (ref 11.4–15.2)

## 2020-09-07 LAB — GLUCOSE, CAPILLARY: Glucose-Capillary: 182 mg/dL — ABNORMAL HIGH (ref 70–99)

## 2020-09-07 LAB — LACTIC ACID, PLASMA
Lactic Acid, Venous: 3.4 mmol/L (ref 0.5–1.9)
Lactic Acid, Venous: 4.3 mmol/L (ref 0.5–1.9)

## 2020-09-07 LAB — COMPREHENSIVE METABOLIC PANEL
ALT: 143 U/L — ABNORMAL HIGH (ref 0–44)
AST: 683 U/L — ABNORMAL HIGH (ref 15–41)
Albumin: 3.1 g/dL — ABNORMAL LOW (ref 3.5–5.0)
Alkaline Phosphatase: 85 U/L (ref 38–126)
Anion gap: 10 (ref 5–15)
BUN: 15 mg/dL (ref 8–23)
CO2: 20 mmol/L — ABNORMAL LOW (ref 22–32)
Calcium: 8 mg/dL — ABNORMAL LOW (ref 8.9–10.3)
Chloride: 109 mmol/L (ref 98–111)
Creatinine, Ser: 1.26 mg/dL — ABNORMAL HIGH (ref 0.61–1.24)
GFR, Estimated: >60 mL/min (ref >60)
Glucose, Bld: 156 mg/dL — ABNORMAL HIGH (ref 70–99)
Potassium: 3.9 mmol/L (ref 3.5–5.1)
Sodium: 139 mmol/L (ref 135–145)
Total Bilirubin: 0.9 mg/dL (ref 0.3–1.2)
Total Protein: 5.7 g/dL — ABNORMAL LOW (ref 6.5–8.1)

## 2020-09-07 LAB — TROPONIN I (HIGH SENSITIVITY): Troponin I (High Sensitivity): >24000 ng/L (ref <18)

## 2020-09-07 LAB — BASIC METABOLIC PANEL
Anion gap: 8 (ref 5–15)
BUN: 14 mg/dL (ref 8–23)
CO2: 21 mmol/L — ABNORMAL LOW (ref 22–32)
Calcium: 8.1 mg/dL — ABNORMAL LOW (ref 8.9–10.3)
Chloride: 110 mmol/L (ref 98–111)
Creatinine, Ser: 1.29 mg/dL — ABNORMAL HIGH (ref 0.61–1.24)
GFR, Estimated: >60 mL/min (ref >60)
Glucose, Bld: 175 mg/dL — ABNORMAL HIGH (ref 70–99)
Potassium: 3.8 mmol/L (ref 3.5–5.1)
Sodium: 139 mmol/L (ref 135–145)

## 2020-09-07 LAB — COOXEMETRY PANEL
Carboxyhemoglobin: 0.6 % (ref 0.5–1.5)
Methemoglobin: 1.1 % (ref 0.0–1.5)
O2 Saturation: 61.6 %
Total hemoglobin: 13.8 g/dL (ref 12.0–16.0)

## 2020-09-07 LAB — HEPARIN LEVEL (UNFRACTIONATED): Heparin Unfractionated: 0.27 IU/mL — ABNORMAL LOW (ref 0.30–0.70)

## 2020-09-07 LAB — MAGNESIUM: Magnesium: 1.9 mg/dL (ref 1.7–2.4)

## 2020-09-07 LAB — ECHOCARDIOGRAM COMPLETE
Area-P 1/2: 6.27 cm2
Height: 69 in
Weight: 3200 oz

## 2020-09-07 LAB — BRAIN NATRIURETIC PEPTIDE: B Natriuretic Peptide: 192.7 pg/mL — ABNORMAL HIGH (ref 0.0–100.0)

## 2020-09-07 LAB — POCT ACTIVATED CLOTTING TIME: Activated Clotting Time: 208 seconds

## 2020-09-07 LAB — MRSA NEXT GEN BY PCR, NASAL: MRSA by PCR Next Gen: NOT DETECTED

## 2020-09-07 MED ORDER — PANTOPRAZOLE SODIUM 40 MG IV SOLR
40.0000 mg | Freq: Every day | INTRAVENOUS | Status: DC
  Administered 2020-09-07 – 2020-09-10 (×4): 40 mg via INTRAVENOUS
  Filled 2020-09-07 (×4): qty 40

## 2020-09-07 MED ORDER — FENTANYL 2500MCG IN NS 250ML (10MCG/ML) PREMIX INFUSION
25.0000 ug/h | INTRAVENOUS | Status: DC
  Administered 2020-09-07: 25 ug/h via INTRAVENOUS
  Administered 2020-09-09: 125 ug/h via INTRAVENOUS
  Filled 2020-09-07 (×3): qty 250

## 2020-09-07 MED ORDER — EPINEPHRINE HCL 5 MG/250ML IV SOLN IN NS
0.5000 ug/min | INTRAVENOUS | Status: DC
  Administered 2020-09-07: 2 ug/min via INTRAVENOUS

## 2020-09-07 MED ORDER — MIDAZOLAM HCL 2 MG/2ML IJ SOLN
1.0000 mg | INTRAMUSCULAR | Status: DC | PRN
  Administered 2020-09-07 – 2020-09-08 (×3): 1 mg via INTRAVENOUS
  Filled 2020-09-07 (×3): qty 2

## 2020-09-07 MED ORDER — FENTANYL CITRATE (PF) 100 MCG/2ML IJ SOLN
INTRAMUSCULAR | Status: AC
  Administered 2020-09-07: 100 ug via INTRAVENOUS
  Filled 2020-09-07: qty 2

## 2020-09-07 MED ORDER — DEXMEDETOMIDINE HCL IN NACL 400 MCG/100ML IV SOLN
0.4000 ug/kg/h | INTRAVENOUS | Status: DC
  Administered 2020-09-07: 0.8 ug/kg/h via INTRAVENOUS
  Administered 2020-09-07: 0.4 ug/kg/h via INTRAVENOUS

## 2020-09-07 MED ORDER — SODIUM CHLORIDE 0.9 % IV SOLN: INTRAVENOUS | Status: DC

## 2020-09-07 MED ORDER — FUROSEMIDE 10 MG/ML IJ SOLN
40.0000 mg | Freq: Once | INTRAMUSCULAR | Status: AC
  Administered 2020-09-07: 40 mg via INTRAVENOUS
  Filled 2020-09-07: qty 4

## 2020-09-07 MED ORDER — ASPIRIN 81 MG PO CHEW
81.0000 mg | CHEWABLE_TABLET | Freq: Every day | ORAL | Status: DC
  Administered 2020-09-08 – 2020-09-09 (×2): 81 mg
  Filled 2020-09-07 (×2): qty 1

## 2020-09-07 MED ORDER — DOCUSATE SODIUM 50 MG/5ML PO LIQD
100.0000 mg | Freq: Two times a day (BID) | ORAL | Status: DC
Start: 2020-09-07 — End: 2020-09-10
  Administered 2020-09-07 – 2020-09-09 (×5): 100 mg
  Filled 2020-09-07 (×5): qty 10

## 2020-09-07 MED ORDER — NITROGLYCERIN 2 % TD OINT
1.0000 [in_us] | TOPICAL_OINTMENT | Freq: Once | TRANSDERMAL | Status: DC
  Filled 2020-09-07: qty 30

## 2020-09-07 MED ORDER — HALOPERIDOL LACTATE 5 MG/ML IJ SOLN
INTRAMUSCULAR | Status: AC
  Administered 2020-09-07: 2 mg via INTRAVENOUS
  Filled 2020-09-07: qty 1

## 2020-09-07 MED ORDER — HALOPERIDOL LACTATE 5 MG/ML IJ SOLN
2.0000 mg | Freq: Once | INTRAMUSCULAR | Status: AC
  Administered 2020-09-07: 2 mg via INTRAVENOUS

## 2020-09-07 MED ORDER — ETOMIDATE 2 MG/ML IV SOLN: 20.0000 mg | Freq: Once | INTRAVENOUS | Status: AC

## 2020-09-07 MED ORDER — MIDAZOLAM HCL 2 MG/2ML IJ SOLN
1.0000 mg | INTRAMUSCULAR | Status: AC | PRN
  Administered 2020-09-07 (×3): 1 mg via INTRAVENOUS
  Filled 2020-09-07 (×3): qty 2

## 2020-09-07 MED ORDER — INSULIN ASPART 100 UNIT/ML IJ SOLN
0.0000 [IU] | INTRAMUSCULAR | Status: DC
  Administered 2020-09-07 – 2020-09-08 (×2): 3 [IU] via SUBCUTANEOUS
  Administered 2020-09-08 (×3): 2 [IU] via SUBCUTANEOUS
  Administered 2020-09-08: 3 [IU] via SUBCUTANEOUS
  Administered 2020-09-08 – 2020-09-09 (×2): 2 [IU] via SUBCUTANEOUS
  Administered 2020-09-09: 3 [IU] via SUBCUTANEOUS
  Administered 2020-09-09 – 2020-09-14 (×20): 2 [IU] via SUBCUTANEOUS
  Administered 2020-09-14: 3 [IU] via SUBCUTANEOUS
  Administered 2020-09-14 – 2020-09-16 (×8): 2 [IU] via SUBCUTANEOUS
  Administered 2020-09-16: 3 [IU] via SUBCUTANEOUS
  Administered 2020-09-16 – 2020-09-17 (×3): 2 [IU] via SUBCUTANEOUS
  Administered 2020-09-17 (×2): 3 [IU] via SUBCUTANEOUS
  Administered 2020-09-17: 2 [IU] via SUBCUTANEOUS
  Administered 2020-09-17 (×2): 3 [IU] via SUBCUTANEOUS
  Administered 2020-09-18: 2 [IU] via SUBCUTANEOUS
  Administered 2020-09-18 (×3): 3 [IU] via SUBCUTANEOUS
  Administered 2020-09-18: 2 [IU] via SUBCUTANEOUS
  Administered 2020-09-18 – 2020-09-19 (×2): 3 [IU] via SUBCUTANEOUS
  Administered 2020-09-19: 2 [IU] via SUBCUTANEOUS
  Administered 2020-09-19: 3 [IU] via SUBCUTANEOUS
  Administered 2020-09-19 – 2020-09-20 (×4): 2 [IU] via SUBCUTANEOUS
  Administered 2020-09-20: 3 [IU] via SUBCUTANEOUS
  Administered 2020-09-20 (×2): 2 [IU] via SUBCUTANEOUS
  Administered 2020-09-20: 3 [IU] via SUBCUTANEOUS
  Administered 2020-09-20 – 2020-09-21 (×3): 2 [IU] via SUBCUTANEOUS
  Administered 2020-09-21: 3 [IU] via SUBCUTANEOUS
  Administered 2020-09-21: 2 [IU] via SUBCUTANEOUS
  Administered 2020-09-21: 3 [IU] via SUBCUTANEOUS
  Administered 2020-09-22: 5 [IU] via SUBCUTANEOUS
  Administered 2020-09-22: 3 [IU] via SUBCUTANEOUS
  Administered 2020-09-22: 5 [IU] via SUBCUTANEOUS
  Administered 2020-09-22: 3 [IU] via SUBCUTANEOUS

## 2020-09-07 MED ORDER — CHLORHEXIDINE GLUCONATE 0.12% ORAL RINSE (MEDLINE KIT)
15.0000 mL | Freq: Two times a day (BID) | OROMUCOSAL | Status: DC
  Administered 2020-09-07 – 2020-09-10 (×5): 15 mL via OROMUCOSAL

## 2020-09-07 MED ORDER — ETOMIDATE 2 MG/ML IV SOLN
INTRAVENOUS | Status: AC
  Administered 2020-09-07: 20 mg via INTRAVENOUS
  Filled 2020-09-07: qty 10

## 2020-09-07 MED ORDER — COSYNTROPIN 0.25 MG IJ SOLR: 0.2500 mg | Freq: Once | INTRAMUSCULAR | Status: DC

## 2020-09-07 MED ORDER — POLYETHYLENE GLYCOL 3350 17 G PO PACK
17.0000 g | PACK | Freq: Every day | ORAL | Status: DC
Start: 2020-09-07 — End: 2020-09-11
  Administered 2020-09-07 – 2020-09-11 (×5): 17 g
  Filled 2020-09-07 (×5): qty 1

## 2020-09-07 MED ORDER — EPINEPHRINE HCL 5 MG/250ML IV SOLN IN NS
INTRAVENOUS | Status: AC
  Administered 2020-09-07: 2 mg
  Filled 2020-09-07: qty 250

## 2020-09-07 MED ORDER — MIDAZOLAM HCL 2 MG/2ML IJ SOLN: 1.0000 mg | Freq: Once | INTRAMUSCULAR | Status: AC

## 2020-09-07 MED ORDER — ROCURONIUM BROMIDE 50 MG/5ML IV SOLN
70.0000 mg | Freq: Once | INTRAVENOUS | Status: AC
  Filled 2020-09-07: qty 7

## 2020-09-07 MED ORDER — ATORVASTATIN CALCIUM 80 MG PO TABS
80.0000 mg | ORAL_TABLET | Freq: Every day | ORAL | Status: DC
  Administered 2020-09-08 – 2020-09-09 (×2): 80 mg
  Filled 2020-09-07 (×2): qty 1

## 2020-09-07 MED ORDER — FUROSEMIDE 10 MG/ML IJ SOLN
40.0000 mg | Freq: Two times a day (BID) | INTRAMUSCULAR | Status: AC
  Administered 2020-09-07 – 2020-09-08 (×2): 40 mg via INTRAVENOUS
  Filled 2020-09-07 (×2): qty 4

## 2020-09-07 MED ORDER — ORAL CARE MOUTH RINSE
15.0000 mL | OROMUCOSAL | Status: DC
  Administered 2020-09-07 – 2020-09-09 (×17): 15 mL via OROMUCOSAL

## 2020-09-07 MED ORDER — MIDAZOLAM HCL 2 MG/2ML IJ SOLN
INTRAMUSCULAR | Status: AC
  Administered 2020-09-07: 1 mg via INTRAVENOUS
  Filled 2020-09-07: qty 2

## 2020-09-07 MED ORDER — PERFLUTREN LIPID MICROSPHERE
1.0000 mL | INTRAVENOUS | Status: AC | PRN
  Administered 2020-09-07: 1 mL via INTRAVENOUS
  Filled 2020-09-07: qty 10

## 2020-09-07 MED ORDER — FENTANYL BOLUS VIA INFUSION
25.0000 ug | INTRAVENOUS | Status: DC | PRN
  Administered 2020-09-07 (×2): 50 ug via INTRAVENOUS
  Filled 2020-09-07: qty 100

## 2020-09-07 MED ORDER — NOREPINEPHRINE 4 MG/250ML-% IV SOLN
0.0000 ug/min | INTRAVENOUS | Status: DC
  Administered 2020-09-08: 2 ug/min via INTRAVENOUS
  Filled 2020-09-07 (×2): qty 250

## 2020-09-07 MED ORDER — FENTANYL CITRATE (PF) 100 MCG/2ML IJ SOLN
25.0000 ug | Freq: Once | INTRAMUSCULAR | Status: AC
Start: 2020-09-07 — End: 2020-09-07

## 2020-09-07 MED ORDER — FENTANYL CITRATE (PF) 100 MCG/2ML IJ SOLN: 50.0000 ug | Freq: Once | INTRAMUSCULAR | Status: DC

## 2020-09-07 MED ORDER — HALOPERIDOL LACTATE 5 MG/ML IJ SOLN: 2.0000 mg | Freq: Four times a day (QID) | INTRAMUSCULAR | Status: DC | PRN

## 2020-09-07 MED ORDER — SODIUM CHLORIDE 0.9 % IV SOLN: INTRAVENOUS | Status: DC | PRN

## 2020-09-07 MED ORDER — TICAGRELOR 90 MG PO TABS
90.0000 mg | ORAL_TABLET | Freq: Two times a day (BID) | ORAL | Status: DC
  Administered 2020-09-07 – 2020-09-09 (×5): 90 mg
  Filled 2020-09-07 (×5): qty 1

## 2020-09-07 MED ORDER — ROCURONIUM BROMIDE 10 MG/ML (PF) SYRINGE
PREFILLED_SYRINGE | INTRAVENOUS | Status: AC
  Administered 2020-09-07: 70 mg via INTRAVENOUS
  Filled 2020-09-07: qty 10

## 2020-09-07 MED ORDER — HEPARIN SODIUM (PORCINE) 5000 UNIT/ML IJ SOLN
5000.0000 [IU] | Freq: Three times a day (TID) | INTRAMUSCULAR | Status: DC
  Administered 2020-09-07 – 2020-09-08 (×2): 5000 [IU] via SUBCUTANEOUS
  Filled 2020-09-07 (×2): qty 1

## 2020-09-07 MED FILL — Midazolam HCl Inj 2 MG/2ML (Base Equivalent): INTRAMUSCULAR | Qty: 2 | Status: AC

## 2020-09-07 NOTE — Procedures (Signed)
Intubation Procedure Note  Jesse Lowe  [id number]  [birth year]  Date:09/07/20  Time:5:56 PM   Provider Performing:Analyssa Downs    Procedure: Intubation (31500)  Indication(s) Respiratory Failure  Consent Unable to obtain consent due to emergent nature of procedure.   Anesthesia Etomidate, Versed, Fentanyl, and Rocuronium   Time Out Verified patient identification, verified procedure, site/side was marked, verified correct patient position, special equipment/implants available, medications/allergies/relevant history reviewed, required imaging and test results available.   Sterile Technique Usual hand hygeine, masks, and gloves were used   Procedure Description Patient positioned in bed supine.  Sedation given as noted above.  Patient was intubated with endotracheal tube using Glidescope.  View was Grade 1 full glottis .  Number of attempts was 1.  Colorimetric CO2 detector was consistent with tracheal placement.   Complications/Tolerance None; patient tolerated the procedure well. Chest X-ray is ordered to verify placement.   EBL none  Lynnell Catalan, MD [facility] ICU Physician [facility]  Pager: [phone] Or Epic Secure Chat After hours: [phone].  09/07/2020, 5:57 PM

## 2020-09-07 NOTE — Progress Notes (Signed)
Post intubation ABG obtained on ventilator settings of VT: 560, RR: 24, FIO2: 100%, PEEP: 8.0.  Per MD, RR decreased to 20.  Will update vent order to correlate.  Tolerating well at this time.   Ref. Range 09/07/2020 18:14  Sample type Unknown ARTERIAL  pH, Arterial Latest Ref Range: 7.350 - 7.450  7.414  pCO2 arterial Latest Ref Range: 32.0 - 48.0 mmHg 27.4 (L)  pO2, Arterial Latest Ref Range: 83.0 - 108.0 mmHg 75 (L)  TCO2 Latest Ref Range: 22 - 32 mmol/L 18 (L)  Acid-base deficit Latest Ref Range: 0.0 - 2.0 mmol/L 6.0 (H)  Bicarbonate Latest Ref Range: 20.0 - 28.0 mmol/L 17.5 (L)  O2 Saturation Latest Units: % 95.0

## 2020-09-07 NOTE — TOC Benefit Eligibility Note (Signed)
Patient Product/process development scientist completed.    The patient is currently admitted and upon discharge could be taking Brilinta 90 mg.  The current 30 day co-pay is, $142.00 due to a $95.00 deductible, then it will be $47.00 a month.   The patient is insured through [employer]     Roland Earl, CPhT Pharmacy Patient Advocate Specialist [facility] Antimicrobial Stewardship Team Direct Number: [phone]  Fax: [phone]

## 2020-09-07 NOTE — Progress Notes (Signed)
ANTICOAGULATION CONSULT NOTE  Pharmacy Consult for heparin Indication:  IABP  No Known Allergies  Patient Measurements: Height: 5\' 9"  (175.3 cm) Weight: 90.7 kg (200 lb) IBW/kg (Calculated) : 70.7 Heparin Dosing Weight: 90kg  Vital Signs: Temp: 98 F (36.7 C) (06/20 0755) Temp Source: Oral (06/20 0755) BP: 93/70 (06/20 0800) Pulse Rate: 95 (06/20 0800)  Labs: Recent Labs    08/25/2020 1551 08/30/2020 1859 09/07/20 0137  HGB 13.8  --  12.9*  HCT 38.3*  --  36.6*  PLT 361  --  324  APTT 84*  --   --   LABPROT 14.2  --   --   INR 1.1  --   --   HEPARINUNFRC  --   --  0.27*  CREATININE 1.21  --  1.26*  TROPONINIHS 29* >24,000* >24,000*     Estimated Creatinine Clearance: 62.5 mL/min (A) (by C-G formula based on SCr of 1.26 mg/dL (H)).   Medical History: History reviewed. No pertinent past medical history.  Medications:  Medications Prior to Admission  Medication Sig Dispense Refill Last Dose   BIOTIN PO Take 1 tablet by mouth daily.   08/21/2020   Cyanocobalamin ([employer] B12 PO) Take 1 tablet by mouth daily.   09/07/2020   Pyridoxine HCl (VITAMIN B-6 PO) Take 1 tablet by mouth daily.   08/21/2020   VITAMIN D PO Take 1 tablet by mouth daily.   08/28/2020   VITAMIN E COMPLEX PO Take 1 tablet by mouth daily.   09/12/2020    Assessment: 70 year old male presenting to [facility] with chest pain, code stemi initiated. Patient now s/p stent to LAD and IABP inserted for support. No anticoagulants prior to admit. CBC appears normal.  Heparin level came back therapeutic at 0.27, on 1000 units/hr. Hgb 12.9, plt 324. Trop remains >24k. No s/sx of bleeding or infusion issues. IABP still running at 1:1.   Goal of Therapy:  Heparin level 0.2-0.5 units/ml Monitor platelets by anticoagulation protocol: Yes   Plan:  Heparin stopped for IABP pulled - no other indication for heparin post-IABP removal  [facility], PharmD, BCCCP Clinical Pharmacist  Phone: [phone] 09/07/2020 9:27  AM  Please check AMION for all [facility] Pharmacy phone numbers After 10:00 PM, call Main Pharmacy [phone]

## 2020-09-07 NOTE — Procedures (Signed)
Arterial Catheter Insertion Procedure Note  Amro Winebarger  [id number]  November 06, 1950  Date:09/07/20  Time:6:25 PM    Provider Performing: Elyn Peers    Procedure: Insertion of Arterial Line (41324) without US guidance  Indication(s) Blood pressure monitoring and/or need for frequent ABGs  Consent Unable to obtain consent due to emergent nature of procedure.  Anesthesia None   Time Out Verified patient identification, verified procedure, site/side was marked, verified correct patient position, special equipment/implants available, medications/allergies/relevant history reviewed, required imaging and test results available.   Sterile Technique Maximal sterile technique including full sterile barrier drape, hand hygiene, sterile gown, sterile gloves, mask, hair covering, sterile ultrasound probe cover (if used).   Procedure Description Area of catheter insertion was cleaned with chlorhexidine and draped in sterile fashion. Without real-time ultrasound guidance an arterial catheter was placed into the right radial artery.  Appropriate arterial tracings confirmed on monitor.     Complications/Tolerance None; patient tolerated the procedure well.   EBL Minimal   Specimen(s) None

## 2020-09-07 NOTE — Progress Notes (Signed)
Subjective:  Patient admitted to [facility] emergency department yesterday with STEMI followed by V. fib cardiac arrest, with return of spontaneous circulation after approximately 5 minutes of CPR.  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  Patient underwent successful stenting to the LAD as well as placement of IABP.   Upon further questioning today patient admits to history of high blood pressure and hyperlipidemia.  He denies other significant cardiovascular history or risk factors.   Patient seen and examined at approximately 8:15 AM today Patient seems mildly confused, nurse reports he became more confused and agitated last night following administration of Ambien. Levophed has been discontinued.  Blood pressure remains soft but has improved to approximately 113/73 mmHg at time of exam.  Intake/Output from previous day:  I/O last 3 completed shifts: In: 1628.4 [P.O.:300; I.V.:1328.4] Out: 200 [Urine:200] No intake/output data recorded.  Blood pressure 112/68, pulse 89, temperature 98.3 F (36.8 C), temperature source Oral, resp. rate 18, height 5' 9"  (1.753 m), weight 90.7 kg, SpO2 94 %. Physical Exam Vitals reviewed.  HENT:     Head: Normocephalic and atraumatic.  Cardiovascular:     Rate and Rhythm: Normal rate and regular rhythm.     Pulses: Intact distal pulses.     Heart sounds: S1 normal and S2 normal. Heart sounds are distant. No murmur heard.   No gallop.  Pulmonary:     Effort: Pulmonary effort is normal. No respiratory distress.     Breath sounds: No wheezing, rhonchi or rales.  Musculoskeletal:     Right lower leg: No edema.     Left lower leg: No edema.  Skin:    General: Skin is warm and dry.  Neurological:     Mental Status: He is alert.     Comments: Patient appears mildly confused. Oriented to self, place, time, and situation     Lab Results: BMP BNP (last 3 results) No results for input(s): BNP in the last 8760 hours.  ProBNP (last 3 results) No results for input(s): PROBNP in the last 8760 hours. BMP Latest Ref Rng & Units 09/07/2020 09/15/2020  Glucose 70 - 99 mg/dL 156(H) 127(H)  BUN 8 - 23 mg/dL 15 13  Creatinine 0.61 - 1.24 mg/dL 1.26(H) 1.21  Sodium 135 - 145 mmol/L 139 138  Potassium 3.5 - 5.1 mmol/L 3.9 3.2(L)  Chloride 98 - 111 mmol/L 109 107  CO2 22 - 32 mmol/L 20(L) 22  Calcium 8.9 - 10.3 mg/dL 8.0(L) 8.2(L)   Hepatic Function Latest Ref Rng & Units 09/07/2020 08/21/2020  Total Protein 6.5 - 8.1 g/dL 5.7(L) 6.3(L)  Albumin 3.5 - 5.0 g/dL 3.1(L) 3.5  AST 15 - 41 U/L 683(H) 26  ALT 0 - 44 U/L 143(H) 18  Alk Phosphatase 38 - 126 U/L 85 97  Total Bilirubin 0.3 - 1.2 mg/dL 0.9 0.6   CBC Latest Ref Rng & Units 09/07/2020 08/29/2020  WBC 4.0 - 10.5 K/uL 10.0 7.3  Hemoglobin 13.0 - 17.0 g/dL 12.9(L) 13.8  Hematocrit 39.0 - 52.0 % 36.6(L) 38.3(L)  Platelets 150 - 400 K/uL 324 361   Lipid Panel     Component Value Date/Time   CHOL 182 08/24/2020 1551   TRIG 225 (H) 09/17/2020 1551   HDL 31 (L) 09/01/2020 1551   CHOLHDL 5.9 09/12/2020 1551   VLDL 45 (H) 09/17/2020 1551   LDLCALC 106 (H) 09/13/2020 1551  Cardiac Panel (last 3 results) No results for input(s): CKTOTAL, CKMB, TROPONINI, RELINDX in the last 72 hours.  HEMOGLOBIN A1C No results found for: HGBA1C, MPG TSH Recent Labs    09/05/2020 1859  TSH 2.235   Imaging: DG CHEST X-ray 09/07/2020 VIEW COMPARISON:  09/12/2020  FINDINGS: Mild bilateral interstitial thickening likely reflecting mild interstitial edema. No focal consolidation. No pleural effusion or pneumothorax. Heart and mediastinal contours are unremarkable. Intra-aortic balloon pump is noted with the metallic tip at the level of the aortic arch unchanged from the prior exam. No acute osseous abnormality.  IMPRESSION: Intra-aortic balloon pump is noted with the metallic tip  at the level of the aortic arch unchanged from the prior exam.   DG Chest X-ray 08/21/2020:  Cardiac shadow is within normal limits. Prior coronary stenting is seen. Intra-aortic balloon pump is noted just below the aortic knob. Lungs are well aerated bilaterally with mild interstitial edema.  IMPRESSION: Mild interstitial edema. Balloon pump in satisfactory position.  Cardiac Studies: Left heart catheterization 09/04/2020: LV 56/15, EDP 21 mmHg.  Aorta 75/44, mean 57 mmHg.  No pressure gradient across the aortic valve.  Markedly elevated EDP. LM: Large vessel, bifurcates into circumflex and LAD. LAD: Has ulcerated subtotally occluded proximal LAD stenosis that extends to the mid segment.  There is TIMI I flow also in the D1 and D2.  Moderate-sized D1 and small sized D2.  Mild disease in the mid to distal LAD. CX: Moderate sized vessel, gives origin to a large OM1, OM 2 is very small, there is a lesion after OM 2 at 80% focal lesion.  OM 3 is moderate sized with secondary branches. RCA: Dominant.  Proximal segment 90% stenosis, tandem 30 to 40% stenosis in the midsegment followed by a high-grade 90% stenosis.  Mild disease in the distal right coronary.   Intervention: Successful 2 overlapping stent implantation with 3.0 x 24 mm followed by 3.0 x 16 mm Synergy XD stents, stenosis reduced from 99% to 0%, TIMI I improved to TIMI-3 flow.   Recommendation: Patient will need relook at the LAD stent in view of severe spasm and cardiogenic shock and need for pressor support in the cardiac catheterization lab.  Patient was started on Levophed due to low blood pressure and intra-aortic balloon pump was inserted percutaneously prior to angioplasty.  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  115 mL contrast utilized.  Echocardiogram 2/60/2022:   1. Left ventricular ejection fraction, by  estimation, is 20 to 25%. The  left ventricle has severely decreased function. The left ventricle  demonstrates regional wall motion abnormalities (see scoring  diagram/findings for description). Left ventricular  diastolic parameters were normal. There is akinesis of the left  ventricular, entire anterior wall, anterolateral wall and apical segment.   2. Right ventricular systolic function is normal. The right ventricular  size is normal. There is mildly elevated pulmonary artery systolic  pressure. The estimated right ventricular systolic pressure is 02.7 mmHg.   3. Left atrial size was mildly dilated.   4. The mitral valve is normal in structure. No evidence of mitral valve  regurgitation. No evidence of mitral stenosis.   5. The aortic valve is normal in structure. Aortic valve regurgitation is  not visualized. No aortic stenosis is present.  EKG:  08/29/2020: 1557 hrs.: Ventricular fibrillation. 09/11/2020 at 1550 hrs.: Anteroseptal and high lateral STEMI.  Telemetry: Sinus rhythm with runs of nonsustained ventricular tachycardia 6-8 beats   No results found for this or any previous visit (from the past 43800 hour(s)).  Scheduled Meds:  aspirin  81 mg Oral Daily   atorvastatin  80 mg Oral Daily   Chlorhexidine Gluconate Cloth  6 each Topical Daily   sodium chloride flush  3 mL Intravenous Q12H   ticagrelor  90 mg Oral BID   Continuous Infusions:  sodium chloride Stopped (09/07/20 0119)   sodium chloride     sodium chloride     sodium chloride 90 mL/hr at 09/07/20 0700   heparin 1,000 Units/hr (09/07/20 0700)   norepinephrine (LEVOPHED) Adult infusion Stopped (09/02/2020 2351)   PRN Meds:.sodium chloride, acetaminophen, nitroGLYCERIN, ondansetron (ZOFRAN) IV, oxyCODONE, sodium chloride flush, zolpidem  Assessment/Plan:   1.  Acute anterior and lateral STEMI, ST elevation MI. 2.  V. fib arrest - 08/21/2020  3.  Cardiogenic shock 4.  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  He is now off Levophed, however blood pressure remains soft.  Patient denies chest pain at this time.  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  We will continue to follow closely.  Will remove IABP and plan to initiate guideline directed medical therapy as tolerated.  We will hold off on initiating beta-blocker therapy for ACE/ARB given patient's soft blood pressure.  We will continue, Brilinta, and atorvastatin at this time.  Echocardiogram revealed LVEF of 20-25%.  We will therefore recommend initiation of guideline directed medical therapy for HFrEF when able from blood pressure standpoint.  Patient's renal function remains stable.   Will consider repeat cardiac catheterization for relook at the LAD as well as consideration for intervention to right and circumflex coronary artery when patient is more stable.  Patient was seen in collaboration with Dr. Einar Gip. He also reviewed patient's chart and examined the patient. Dr. Einar Gip is in agreement of the plan.      Alethia Berthold, PA-C 09/07/2020, 7:53 AM Office: [phone]

## 2020-09-07 NOTE — Progress Notes (Signed)
Called Dr Jacinto Halim in regards to the no U/O produced form patient and bladder scanner results showing . Gave verbal orders to restarted IVF at 60ml/kg and will reassess from morning BMP.

## 2020-09-07 NOTE — Progress Notes (Signed)
EKG CRITICAL VALUE     12 lead EKG performed.  Critical value noted.  Royston Bake, RN notified.   Edmonia Caprio, CCT 09/07/2020 8:13 AM

## 2020-09-07 NOTE — Progress Notes (Signed)
ABG ordered and obtained prior to bipap initiation.  Sample was obtained while patient was on 10L salter high-flow cannula.    Ref. Range 09/07/2020 15:33  Sample type Unknown ARTERIAL  pH, Arterial Latest Ref Range: 7.350 - 7.450  7.484 (H)  pCO2 arterial Latest Ref Range: 32.0 - 48.0 mmHg 21.8 (L)  pO2, Arterial Latest Ref Range: 83.0 - 108.0 mmHg 52 (L)  TCO2 Latest Ref Range: 22 - 32 mmol/L 17 (L)  Acid-base deficit Latest Ref Range: 0.0 - 2.0 mmol/L 5.0 (H)  Bicarbonate Latest Ref Range: 20.0 - 28.0 mmol/L 16.4 (L)  O2 Saturation Latest Units: % 90.0  Patient temperature Unknown 98.2 F  Collection site Unknown Radial

## 2020-09-07 NOTE — Progress Notes (Signed)
Pt tried to get out of bed d/t N/V. Pt had one vomiting episode. See MAR for intervention. Pt was re-oriented to call bell. And bed alarm active. Pt was in a sitting position on the side of the bed. RN ordered Chest Xray for IABP placement confirmation.

## 2020-09-07 NOTE — Progress Notes (Signed)
*  PRELIMINARY RESULTS* Echocardiogram 2D Echocardiogram with Definity has been performed.  Neomia Dear RDCS 09/07/2020, 8:17 AM

## 2020-09-07 NOTE — Progress Notes (Signed)
ANTICOAGULATION CONSULT NOTE Pharmacy Consult for heparin Indication:  IABP  No Known Allergies  Patient Measurements: Height: 5\' 9"  (175.3 cm) Weight: 90.7 kg (200 lb) IBW/kg (Calculated) : 70.7 Heparin Dosing Weight: 90kg  Vital Signs: Temp: 98.1 F (36.7 C) (06/20 0000) Temp Source: Oral (06/20 0000) BP: 113/64 (06/20 0200) Pulse Rate: 93 (06/20 0200)  Labs: Recent Labs    Sep 24, 2020 1551 September 24, 2020 1859 09/07/20 0137  HGB 13.8  --  12.9*  HCT 38.3*  --  36.6*  PLT 361  --  324  APTT 84*  --   --   LABPROT 14.2  --   --   INR 1.1  --   --   HEPARINUNFRC  --   --  0.27*  CREATININE 1.21  --   --   TROPONINIHS 29* >24,000*  --      Estimated Creatinine Clearance: 65 mL/min (by C-G formula based on SCr of 1.21 mg/dL).  Assessment: 69 y.o. male with STEMI s/p PCI, IABP, for heparin  Goal of Therapy:  Heparin level 0.2-0.5 units/ml Monitor platelets by anticoagulation protocol: Yes   Plan:  Continue Heparin at current rate   73, PharmD, BCPS  09/07/2020 2:45 AM

## 2020-09-07 NOTE — Progress Notes (Signed)
RT Note: Halodol given, placed on bipap per MD order. RT will continue to monitor.

## 2020-09-07 NOTE — Progress Notes (Signed)
Pt placed in mittens at this time d/t cont. Pulling at lines

## 2020-09-07 NOTE — Progress Notes (Signed)
Pt is becoming more restless and agitated. Pt is starting to become disoriented and talking out of his head. Pt is attempting to pull lines off.

## 2020-09-07 NOTE — Progress Notes (Signed)
IABP aspirated and removed from RFA. Manual pressure applied for 30 minutes. Site level 0 , no s+s of hematoma.  Throughout hold, patient could not lay still, does not seem to remember things, asking several times, how long this would take.  Tegaderm dressing applied, bedrest intructions given. Patient was able to repeat back instructions to me I.E. not moving his leg.    Bilateral dp and pt pulses easily palpable.    Bedrest begins at 12:00:00 noon.

## 2020-09-07 NOTE — Procedures (Signed)
Central Venous Catheter Insertion Procedure Note  Shalev Helminiak  [id number]  1951/02/27  Date:09/07/20  Time:5:54 PM   Provider Performing:Miasia Crabtree   Procedure: Insertion of Non-tunneled Central Venous Catheter(36556) with US guidance (99371)   Indication(s) Medication administration and Difficult access  Consent Unable to obtain consent due to emergent nature of procedure.  Anesthesia None, patient sedated for intubation  Timeout Verified patient identification, verified procedure, site/side was marked, verified correct patient position, special equipment/implants available, medications/allergies/relevant history reviewed, required imaging and test results available.  Sterile Technique Maximal sterile technique including full sterile barrier drape, hand hygiene, sterile gown, sterile gloves, mask, hair covering, sterile ultrasound probe cover (if used).  Procedure Description Area of catheter insertion was cleaned with chlorhexidine and draped in sterile fashion.  With real-time ultrasound guidance a 67F 20cm central venous catheter was placed into the left internal jugular vein. Nonpulsatile blood flow and easy flushing noted in all ports.  The catheter was sutured in place and sterile dressing applied.  Complications/Tolerance None; patient tolerated the procedure well. Chest X-ray is ordered to verify placement for internal jugular or subclavian cannulation.   Chest x-ray is not ordered for femoral cannulation.  EBL Minimal  Lynnell Catalan, MD [facility] ICU Physician [facility] [place] [facility]  Pager: [phone] Or Epic Secure Chat After hours: [phone].  09/07/2020, 5:55 PM

## 2020-09-07 NOTE — Consult Note (Addendum)
NAME:  Jesse Lowe, MRN:  [id number], DOB:  10-15-1950, LOS: 1 ADMISSION DATE:  08-Sep-2020, CONSULTATION DATE:  6/20 REFERRING MD:  Jacinto Halim, CHIEF COMPLAINT:  delirium and respiratory distress    History of Present Illness:  70 yr old male who was admitted 6/19 w/ cc: severe crushing CP. Code STEMI activated. While waiting had VF arrest req'd ACLS w/ defib, amio epi; estimated 5 minutes to ROSC. Was awake and alert after ROSC obtained. Went to cath lab emergently. Found to have lesion in LAD and high grade stenosis of the RCA and Circ. Underwent successful stent of LAD. And IAB placement. He was weaned off pressors. IABP dc'd am 6/20. Progressively more confused over course of day (had received Ambien that evening) 6/20 w/ increased WOB and agitation. PCCM consulted that afternoon for agitated delirium.    Pertinent  Medical History  HTN and HL Had not seen MD in 10-15 yrs.  Significant Hospital Events: Including procedures, antibiotic start and stop dates in addition to other pertinent events   6/19 admitted w/ STEMI. While waiting had VF arrest req'd ACLS w/ defib, amio epi; estimated 5 minutes to ROSC. Was awake and alert after ROSC obtained. Went to cath lab emergently. Found to have lesion in LAD and high grade stenosis of the RCA and Circ. Underwent successful stent of LAD. And IAB placement 6/20 off IABP. Pressors off. Progressive agitation and WOB. PCCM asked to consult   Interim History / Subjective:  Agitated. Restless but seems a little better after precedex started.   Objective   Blood pressure (Abnormal) 151/91, pulse (Abnormal) 101, temperature 98.2 F (36.8 C), temperature source Oral, resp. rate (Abnormal) 31, height 5\' 9"  (1.753 m), weight 90.7 kg, SpO2 98 %.    FiO2 (%):  [70 %] 70 %   Intake/Output Summary (Last 24 hours) at 09/07/2020 1626 Last data filed at 09/07/2020 1300 Gross per 24 hour  Intake 2428.66 ml  Output 400 ml  Net 2028.66 ml   Filed Weights    09-08-20 1552  Weight: 90.7 kg    Examination: General: 70 year old WM still agitated. Restless.  HENT: BIPAP mask in place. No clear JVD Lungs: RR 30s + accessory use. Audible rales Cardiovascular: tachy RRR Abdomen: soft not tender Extremities: cool, weak pulses.  Neuro: awake. Moves all ext. Agitated oriented but not able to reason with  GU: due to void  Labs/imaging that I havepersonally reviewed  (right click and "Reselect all SmartList Selections" daily)  See below   Resolved Hospital Problem list     Assessment & Plan:  Acute metabolic encephalopathy. Suspect that this is multi-factorial: drug induced 74) but also wonder could this be occult shock or hypoxia related.  Also consider ETOH w/d  Plan Precedex started Supportive care Serial neuro checks Dc ambien  May need to proceed w/ intubation given concern about additional cardiac demand of agitation.    Acute hypoxic respiratory failure w/ progressive pulmonary pulm edema  Plan Cont BIPAP IV lasix Ll prob need intubation  Acute systolic HF w/ EF 20-25%; S/p STEMI requiring stent to LAD, also has sig CAD involving the RCA and Circ  -echo w/ severe LV dysfxn. EF 20-25% Plan Cont tele  Asa, statin and brilinta  Ck lactate Cycle CEs Ck co-ox  AKI Plan Keep euvolemic Renal dose meds Strict I&O Serial chem  Hyperglycemia Plan SSI   Abnormal  LFTs Plan Trend LFTs   Best Practice (right click and "Reselect all SmartList Selections" daily)  Diet/type: NPO Pain/Anxiety/Delirium protocol RASS goal -1 VAP protocol (if indicated): Yes DVT prophylaxis: prophylactic heparin  GI prophylaxis: PPI Glucose control:  SSI Central venous access:  Yes, and it is still needed Arterial line:  Yes, and it is still needed Foley:  N/A Mobility:  bed rest  PT consulted: N/A Studies pending: None Culture data pending:none Last reviewed culture data:today Antibiotics:not indicated  Antibiotic  de-escalation: no,  continue current rx Stop date: N/A Daily labs: not indicated Code Status:  full code Last date of multidisciplinary goals of care discussion [pending ] ccm prognosis: Life-threating Disposition: remains critically ill, will stay in intensive care        Labs   CBC: Recent Labs  Lab 08/26/2020 1551 09/07/20 0137 09/07/20 1533  WBC 7.3 10.0  --   NEUTROABS 3.3  --   --   HGB 13.8 12.9* 12.6*  HCT 38.3* 36.6* 37.0*  MCV 95.3 91.0  --   PLT 361 324  --     Basic Metabolic Panel: Recent Labs  Lab 09/05/2020 1551 09/07/20 0137 09/07/20 1533  NA 138 139 140  K 3.2* 3.9 3.6  CL 107 109  --   CO2 22 20*  --   GLUCOSE 127* 156*  --   BUN 13 15  --   CREATININE 1.21 1.26*  --   CALCIUM 8.2* 8.0*  --   MG 2.2  --   --    GFR: Estimated Creatinine Clearance: 62.5 mL/min (A) (by C-G formula based on SCr of 1.26 mg/dL (H)). Recent Labs  Lab 09/14/2020 1551 09/07/20 0137  WBC 7.3 10.0    Liver Function Tests: Recent Labs  Lab 08/20/2020 1551 09/07/20 0137  AST 26 683*  ALT 18 143*  ALKPHOS 97 85  BILITOT 0.6 0.9  PROT 6.3* 5.7*  ALBUMIN 3.5 3.1*   No results for input(s): LIPASE, AMYLASE in the last 168 hours. No results for input(s): AMMONIA in the last 168 hours.  ABG    Component Value Date/Time   PHART 7.484 (H) 09/07/2020 1533   PCO2ART 21.8 (L) 09/07/2020 1533   PO2ART 52 (L) 09/07/2020 1533   HCO3 16.4 (L) 09/07/2020 1533   TCO2 17 (L) 09/07/2020 1533   ACIDBASEDEF 5.0 (H) 09/07/2020 1533   O2SAT 90.0 09/07/2020 1533     Coagulation Profile: Recent Labs  Lab 09/01/2020 1551  INR 1.1    Cardiac Enzymes: No results for input(s): CKTOTAL, CKMB, CKMBINDEX, TROPONINI in the last 168 hours.  HbA1C: No results found for: HGBA1C  CBG: No results for input(s): GLUCAP in the last 168 hours.  Review of Systems:   Unable   Past Medical History:  He,  has no past medical history on file.   Surgical History:   Past Surgical  History:  Procedure Laterality Date   CORONARY/GRAFT ACUTE MI REVASCULARIZATION N/A 08/28/2020   Procedure: Coronary/Graft Acute MI Revascularization;  Surgeon: Yates Decamp, MD;  Location: [facility] INVASIVE CV LAB;  Service: Cardiovascular;  Laterality: N/A;   IABP INSERTION N/A 08/25/2020   Procedure: IABP Insertion;  Surgeon: Yates Decamp, MD;  Location: [facility] INVASIVE CV LAB;  Service: Cardiovascular;  Laterality: N/A;   LEFT HEART CATH AND CORONARY ANGIOGRAPHY N/A 08/22/2020   Procedure: LEFT HEART CATH AND CORONARY ANGIOGRAPHY;  Surgeon: Yates Decamp, MD;  Location: [facility] INVASIVE CV LAB;  Service: Cardiovascular;  Laterality: N/A;     Social History:   reports that he has never smoked. He has never used smokeless tobacco. He  reports that he does not drink alcohol and does not use drugs.   Family History:  His family history is not on file.   Allergies No Known Allergies   Home Medications  Prior to Admission medications   Medication Sig Start Date End Date Taking? Authorizing Provider  BIOTIN PO Take 1 tablet by mouth daily.   Yes [provider]  Cyanocobalamin ([facility] B12 PO) Take 1 tablet by mouth daily.   Yes [provider]  Pyridoxine HCl (VITAMIN B-6 PO) Take 1 tablet by mouth daily.   Yes [provider]  VITAMIN D PO Take 1 tablet by mouth daily.   Yes [provider]  VITAMIN E COMPLEX PO Take 1 tablet by mouth daily.   Yes [provider]     Critical care time: 43 minutes.      Simonne Martinet ACNP-BC [facility] Pulmonary/Critical Care Pager # [phone] OR # [phone] if no answer

## 2020-09-08 DIAGNOSIS — G9340 Encephalopathy, unspecified: Secondary | ICD-10-CM | POA: Diagnosis not present

## 2020-09-08 DIAGNOSIS — J9601 Acute respiratory failure with hypoxia: Secondary | ICD-10-CM

## 2020-09-08 DIAGNOSIS — E440 Moderate protein-calorie malnutrition: Secondary | ICD-10-CM | POA: Insufficient documentation

## 2020-09-08 LAB — POCT I-STAT 7, (LYTES, BLD GAS, ICA,H+H)
Acid-base deficit: 2 mmol/L (ref 0.0–2.0)
Bicarbonate: 21.7 mmol/L (ref 20.0–28.0)
Calcium, Ion: 1.14 mmol/L — ABNORMAL LOW (ref 1.15–1.40)
HCT: 37 % — ABNORMAL LOW (ref 39.0–52.0)
Hemoglobin: 12.6 g/dL — ABNORMAL LOW (ref 13.0–17.0)
O2 Saturation: 97 %
Patient temperature: 100.7
Potassium: 3.7 mmol/L (ref 3.5–5.1)
Sodium: 140 mmol/L (ref 135–145)
TCO2: 23 mmol/L (ref 22–32)
pCO2 arterial: 34 mmHg (ref 32.0–48.0)
pH, Arterial: 7.417 (ref 7.350–7.450)
pO2, Arterial: 95 mmHg (ref 83.0–108.0)

## 2020-09-08 LAB — COMPREHENSIVE METABOLIC PANEL
ALT: 111 U/L — ABNORMAL HIGH (ref 0–44)
AST: 314 U/L — ABNORMAL HIGH (ref 15–41)
Albumin: 2.8 g/dL — ABNORMAL LOW (ref 3.5–5.0)
Alkaline Phosphatase: 77 U/L (ref 38–126)
Anion gap: 12 (ref 5–15)
BUN: 16 mg/dL (ref 8–23)
CO2: 21 mmol/L — ABNORMAL LOW (ref 22–32)
Calcium: 8 mg/dL — ABNORMAL LOW (ref 8.9–10.3)
Chloride: 107 mmol/L (ref 98–111)
Creatinine, Ser: 1.36 mg/dL — ABNORMAL HIGH (ref 0.61–1.24)
GFR, Estimated: 57 mL/min — ABNORMAL LOW (ref >60)
Glucose, Bld: 156 mg/dL — ABNORMAL HIGH (ref 70–99)
Potassium: 3.6 mmol/L (ref 3.5–5.1)
Sodium: 140 mmol/L (ref 135–145)
Total Bilirubin: 1.1 mg/dL (ref 0.3–1.2)
Total Protein: 5.7 g/dL — ABNORMAL LOW (ref 6.5–8.1)

## 2020-09-08 LAB — MAGNESIUM: Magnesium: 2 mg/dL (ref 1.7–2.4)

## 2020-09-08 LAB — COOXEMETRY PANEL
Carboxyhemoglobin: 0.8 % (ref 0.5–1.5)
Carboxyhemoglobin: 1 % (ref 0.5–1.5)
Methemoglobin: 1.1 % (ref 0.0–1.5)
Methemoglobin: 1.1 % (ref 0.0–1.5)
O2 Saturation: 74.1 %
O2 Saturation: 73.7 %
Total hemoglobin: 13.3 g/dL (ref 12.0–16.0)
Total hemoglobin: 12.7 g/dL (ref 12.0–16.0)

## 2020-09-08 LAB — LACTIC ACID, PLASMA: Lactic Acid, Venous: 3.4 mmol/L (ref 0.5–1.9)

## 2020-09-08 LAB — HEPARIN LEVEL (UNFRACTIONATED): Heparin Unfractionated: <0.1 IU/mL — ABNORMAL LOW (ref 0.30–0.70)

## 2020-09-08 LAB — GLUCOSE, CAPILLARY
Glucose-Capillary: 122 mg/dL — ABNORMAL HIGH (ref 70–99)
Glucose-Capillary: 122 mg/dL — ABNORMAL HIGH (ref 70–99)
Glucose-Capillary: 148 mg/dL — ABNORMAL HIGH (ref 70–99)
Glucose-Capillary: 156 mg/dL — ABNORMAL HIGH (ref 70–99)
Glucose-Capillary: 169 mg/dL — ABNORMAL HIGH (ref 70–99)
Glucose-Capillary: 171 mg/dL — ABNORMAL HIGH (ref 70–99)

## 2020-09-08 LAB — CBC
HCT: 38.5 % — ABNORMAL LOW (ref 39.0–52.0)
Hemoglobin: 13 g/dL (ref 13.0–17.0)
MCH: 30.9 pg (ref 26.0–34.0)
MCHC: 33.8 g/dL (ref 30.0–36.0)
MCV: 91.4 fL (ref 80.0–100.0)
Platelets: 329 10*3/uL (ref 150–400)
RBC: 4.21 MIL/uL — ABNORMAL LOW (ref 4.22–5.81)
RDW: 14.2 % (ref 11.5–15.5)
WBC: 14.5 10*3/uL — ABNORMAL HIGH (ref 4.0–10.5)
nRBC: 0 % (ref 0.0–0.2)

## 2020-09-08 LAB — POCT ACTIVATED CLOTTING TIME
Activated Clotting Time: 0 seconds
Activated Clotting Time: 126 seconds

## 2020-09-08 LAB — HEMOGLOBIN A1C
Hgb A1c MFr Bld: 5.5 % (ref 4.8–5.6)
Hgb A1c MFr Bld: 5.3 % (ref 4.8–5.6)
Mean Plasma Glucose: 111 mg/dL
Mean Plasma Glucose: 105 mg/dL

## 2020-09-08 MED ORDER — POTASSIUM CHLORIDE 20 MEQ PO PACK
40.0000 meq | PACK | Freq: Once | ORAL | Status: AC
  Administered 2020-09-08: 40 meq
  Filled 2020-09-08: qty 2

## 2020-09-08 MED ORDER — MILRINONE LACTATE IN DEXTROSE 20-5 MG/100ML-% IV SOLN
0.2500 ug/kg/min | INTRAVENOUS | Status: DC
  Administered 2020-09-08 – 2020-09-10 (×2): 0.125 ug/kg/min via INTRAVENOUS
  Filled 2020-09-08 (×2): qty 100

## 2020-09-08 MED ORDER — ACETAMINOPHEN 160 MG/5ML PO SOLN
650.0000 mg | ORAL | Status: DC | PRN
  Administered 2020-09-08 – 2020-09-09 (×3): 650 mg
  Filled 2020-09-08 (×3): qty 20.3

## 2020-09-08 MED ORDER — IVABRADINE HCL 7.5 MG PO TABS
7.5000 mg | ORAL_TABLET | Freq: Two times a day (BID) | ORAL | Status: DC
  Administered 2020-09-09: 7.5 mg
  Filled 2020-09-08 (×4): qty 1

## 2020-09-08 MED ORDER — FUROSEMIDE 10 MG/ML IJ SOLN: 40.0000 mg | Freq: Once | INTRAMUSCULAR | Status: DC

## 2020-09-08 MED ORDER — HEPARIN (PORCINE) 25000 UT/250ML-% IV SOLN
1500.0000 [IU]/h | INTRAVENOUS | Status: DC
  Administered 2020-09-08: 1000 [IU]/h via INTRAVENOUS
  Administered 2020-09-09: 1500 [IU]/h via INTRAVENOUS
  Filled 2020-09-08 (×2): qty 250

## 2020-09-08 MED ORDER — FUROSEMIDE 10 MG/ML IJ SOLN
80.0000 mg | Freq: Two times a day (BID) | INTRAMUSCULAR | Status: AC
  Administered 2020-09-08 – 2020-09-09 (×2): 80 mg via INTRAVENOUS
  Filled 2020-09-08 (×2): qty 8

## 2020-09-08 MED ORDER — QUETIAPINE FUMARATE 25 MG PO TABS
25.0000 mg | ORAL_TABLET | Freq: Every day | ORAL | Status: DC
  Administered 2020-09-08 – 2020-09-09 (×2): 25 mg
  Filled 2020-09-08 (×2): qty 1

## 2020-09-08 MED ORDER — VITAL HIGH PROTEIN PO LIQD
1000.0000 mL | ORAL | Status: DC
  Administered 2020-09-08: 1000 mL

## 2020-09-08 MED ORDER — PROSOURCE TF PO LIQD
45.0000 mL | Freq: Two times a day (BID) | ORAL | Status: DC
  Administered 2020-09-08 – 2020-09-09 (×3): 45 mL
  Filled 2020-09-08 (×3): qty 45

## 2020-09-08 MED ORDER — VITAL 1.5 CAL PO LIQD
1000.0000 mL | ORAL | Status: DC
  Administered 2020-09-08: 1000 mL

## 2020-09-08 MED ORDER — CHLORHEXIDINE GLUCONATE 0.12 % MT SOLN
OROMUCOSAL | Status: AC
  Administered 2020-09-08: 15 mL via OROMUCOSAL
  Filled 2020-09-08: qty 15

## 2020-09-08 MED FILL — Sodium Chloride IV Soln 0.9%: INTRAVENOUS | Qty: 250 | Status: AC

## 2020-09-08 MED FILL — Epinephrine Inj 30 MG/30ML (1 MG/ML) (1:1000): INTRAMUSCULAR | Qty: 5 | Status: AC

## 2020-09-08 NOTE — Consult Note (Signed)
NAME:  Jesse Lowe, MRN:  [id number], DOB:  October 09, 1950, LOS: 2 ADMISSION DATE:  2020-10-05, CONSULTATION DATE:  6/20 REFERRING MD:  Jacinto Halim, CHIEF COMPLAINT:  delirium and respiratory distress    History of Present Illness:  70 yr old male who was admitted 6/19 w/ cc: severe crushing CP. Code STEMI activated. While waiting had VF arrest req'd ACLS w/ defib, amio epi; estimated 5 minutes to ROSC. Was awake and alert after ROSC obtained. Went to cath lab emergently. Found to have lesion in LAD and high grade stenosis of the RCA and Circ. Underwent successful stent of LAD. And IAB placement. He was weaned off pressors. IABP dc'd am 6/20. Progressively more confused over course of day (had received Ambien that evening) 6/20 w/ increased WOB and agitation. PCCM consulted that afternoon for agitated delirium.    Pertinent  Medical History  HTN and HL Had not seen MD in 10-15 yrs.  Significant Hospital Events: Including procedures, antibiotic start and stop dates in addition to other pertinent events   6/19 admitted w/ STEMI. While waiting had VF arrest req'd ACLS w/ defib, amio epi; estimated 5 minutes to ROSC. Was awake and alert after ROSC obtained. Went to cath lab emergently. Found to have lesion in LAD and high grade stenosis of the RCA and Circ. Underwent successful stent of LAD. And IAB placement 6/20 off IABP. Pressors off. Progressive agitation and WOB. Intubated PCCM asked to consult    Echo 09/07/2020 Left ventricular ejection fraction, by estimation, is 20 to 25%. The left ventricle has severely decreased function. The left ventricle demonstrates regional wall motion abnormalities (see scoring diagram/findings for description). Left ventricular diastolic parameters were normal. There is akinesis of the left ventricular, entire anterior wall, anterolateral wall and apical segment. Right ventricular systolic function is normal. The right ventricular size is normal. There is mildly  elevated pulmonary artery systolic pressure. The estimated right ventricular systolic pressure is 33.9 mmHg. Left atrial size was mildly dilated. The mitral valve is normal in structure. No evidence of mitral valve regurgitation. No evidence of mitral stenosis.  The aortic valve is normal in structure. Aortic valve regurgitation is not visualized. No aortic stenosis is present. Conclusion(s)/Recommendation(s): Findings consistent with ischemic cardiomyopathy.  Interim History / Subjective:  Remains confused. Opening eyes but not following commands, remains on fentanyl at 125 mcg Epi off, BP soft Poor UO with diuresis 6/21/am Lactate 3.4 CVP 11 Remains on 50%, Rate of 20, PEEP 8>> 7.41/34/95/23/21.7 No CXR, BNP this am  + 886 >> 1860  urine out last 24 hours T max 100.7 WBC 14.5 Coox 74%  Objective   Blood pressure 110/77, pulse (!) 124, temperature (!) 100.7 F (38.2 C), temperature source Oral, resp. rate 20, height 5\' 9"  (1.753 m), weight 90.7 kg, SpO2 97 %. CVP:  [5 mmHg-33 mmHg] 10 mmHg  Vent Mode: PRVC FiO2 (%):  [50 %-100 %] 50 % Set Rate:  [20 bmp] 20 bmp Vt Set:  [560 mL] 560 mL PEEP:  [8 cmH20] 8 cmH20 Plateau Pressure:  [18 cmH20-19 cmH20] 19 cmH20   Intake/Output Summary (Last 24 hours) at 09/08/2020 1033 Last data filed at 09/08/2020 1000 Gross per 24 hour  Intake 1151.74 ml  Output 1985 ml  Net -833.26 ml   Filed Weights   10/05/20 1552  Weight: 90.7 kg    Examination: General: 70 year old WM supine in bed , sedated and intubated in NAD  HENT: ETT secure and intact. no LAD, no JVD Lungs:  Bilateral chest excursion, Coarse rhonchi with few rales per bases, not overbreathing vent Cardiovascular: S1, S2, Tachy, RRR, No RMG Abdomen: soft not tender, ND, BS diminished  Extremities: warm and dry, No obvious deformities, brisk refill Neuro: Sedated, opens eyes to call of name, Moves all ext. , but not following commands  GU: Foley cath with concentrated urine  noted  Labs/imaging that I havepersonally reviewed  (right click and "Reselect all SmartList Selections" daily)  See below  Lactate 3.4 CVP 11 Remains on 50%, Rate of 20, PEEP 8>> 7.41/34/95/23/21.7 No CXR, BNP this am  + 886 >> 1860  urine out last 24 hours T max 100.7 WBC 14.5  Resolved Hospital Problem list     Assessment & Plan:  Acute metabolic encephalopathy. Suspect that this is multi-factorial: drug induced Remus Loffler) but also wonder could this be occult shock or hypoxia related.  Also consider ETOH w/d  Plan Continue Precedex , add Seroquel to assist with weaning sedation Supportive care Frequent-orientation Neuro checks per unit protocol Minimize sedation>> No ambien   Acute hypoxic respiratory failure w/ progressive pulmonary pulm edema  Intubated 6/20 No CXR 6/21 Plan Wean Fio2 and PEEP as able Trend ABG, adjust vent setting as needed CXR in am  Diuresis again 6/21>> Dr. 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 EF 20-25% - Echo 6/21 EF  looks about 10% Plan Cont tele  Asa, statin and brilinta  Trend Ck  and lactate Cycle Cardiac enzymes Ck co-ox Start Milrinone Switch epi to levo for MAP goal of > 65  AKI Creatinine up to 1.36 ( 1.29 on 6/20) Plan Keep euvolemic Renal dose meds Strict I&O Serial chem  Hyperglycemia Plan CBG SSI  Add TF coverage once TF started  Abnormal  LFTs Plan Trend LFTs  Nutrition Plan Start TF Dietitian consulted 6/21 Cor track Team 6/22  Elevated WBC and low grade fever ? reactive Plan Monitor Culture as is clinically indicated Low thresh hold  for initiation of antibiotics   Best Practice (right click and "Reselect all SmartList Selections" daily)   Diet/type:Tube feeds Pain/Anxiety/Delirium  protocol RASS goal -1 VAP protocol (if indicated): Yes DVT prophylaxis: prophylactic heparin  GI prophylaxis: PPI Glucose control:  SSI Central venous access:  Yes, and it is still needed Arterial line:  Yes, and it is still needed Foley:  N/A Mobility:  bed rest  PT consulted: N/A Studies pending: None Culture data pending:none Last reviewed culture data:today Antibiotics:not indicated  Antibiotic de-escalation: no,  continue current rx Stop date: N/A Daily labs: not indicated Code Status:  full code Last date of multidisciplinary goals of care discussion [pending ] ccm prognosis: Life-threating Disposition: remains critically ill, will stay in intensive care        Labs   CBC: Recent Labs  Lab 09/21/20 1551 09/07/20 0137 09/07/20 1533 09/07/20 1814 09/08/20 0418 09/08/20 0430  WBC 7.3 10.0  --   --  14.5*  --   NEUTROABS 3.3  --   --   --   --   --   HGB 13.8 12.9* 12.6* 12.9* 13.0 12.6*  HCT 38.3* 36.6* 37.0* 38.0* 38.5* 37.0*  MCV 95.3 91.0  --   --  91.4  --   PLT 361 324  --   --  329  --     Basic Metabolic Panel: Recent Labs  Lab 09-21-20 1551 09/07/20 0137 09/07/20 1533 09/07/20 1814 09/07/20 1818 09/08/20 0418 09/08/20 0430  NA 138 139 140 141 139 140 140  K 3.2* 3.9 3.6 3.7 3.8 3.6 3.7  CL 107 109  --   --  110 107  --   CO2 22 20*  --   --  21* 21*  --   GLUCOSE 127* 156*  --   --  175* 156*  --   BUN 13 15  --   --  14 16  --   CREATININE 1.21 1.26*  --   --  1.29* 1.36*  --   CALCIUM 8.2* 8.0*  --   --  8.1* 8.0*  --   MG 2.2  --   --   --  1.9  --   --    GFR: Estimated Creatinine Clearance: 57.9 mL/min (A) (by C-G formula based on SCr of 1.36 mg/dL (H)). Recent Labs  Lab 09/21/20 1551 09/07/20 0137 09/07/20 1814 09/07/20 2236 09/08/20 0154 09/08/20 0418  WBC 7.3 10.0  --   --   --  14.5*  LATICACIDVEN  --   --  3.4* 4.3* 3.4*  --     Liver Function Tests: Recent Labs  Lab 09-21-20 1551 09/07/20 0137 09/08/20 0418   AST 26 683* 314*  ALT 18 143* 111*  ALKPHOS 97 85 77  BILITOT 0.6 0.9 1.1  PROT 6.3* 5.7* 5.7*  ALBUMIN 3.5 3.1* 2.8*   No results for input(s): LIPASE, AMYLASE in the last 168 hours. No results for input(s): AMMONIA in the last 168 hours.  ABG    Component Value Date/Time   PHART 7.417 09/08/2020 0430   PCO2ART 34.0 09/08/2020 0430   PO2ART 95 09/08/2020 0430   HCO3 21.7 09/08/2020 0430   TCO2 23 09/08/2020 0430   ACIDBASEDEF 2.0 09/08/2020 0430   O2SAT 97.0 09/08/2020 0430     Coagulation Profile: Recent Labs  Lab 21-Sep-2020 1551 09/07/20 1818  INR 1.1 1.2    Cardiac Enzymes: No results for input(s): CKTOTAL, CKMB, CKMBINDEX, TROPONINI in the last 168 hours.  HbA1C: Hgb A1c MFr Bld  Date/Time Value Ref Range Status  09/07/2020 06:14 PM 5.3 4.8 - 5.6 % Final    Comment:    (NOTE)         Prediabetes: 5.7 - 6.4         Diabetes: >6.4         Glycemic control for adults with diabetes: <7.0   September 21, 2020 06:59 PM 5.5 4.8 - 5.6 % Final    Comment:    (NOTE)         Prediabetes: 5.7 - 6.4         Diabetes: >6.4         Glycemic control for adults with diabetes: <7.0     CBG: Recent Labs  Lab 09/07/20 1839 09/07/20 2356 09/08/20 0343 09/08/20 0755  GLUCAP 182* 171* 169* 148*    Review of Systems:   Unable   Past Medical History:  He,  has no past medical history on file.   Surgical History:   Past Surgical History:  Procedure Laterality Date   CORONARY/GRAFT ACUTE MI REVASCULARIZATION N/A September 21, 2020   Procedure: Coronary/Graft Acute MI Revascularization;  Surgeon: Yates Decamp, MD;  [facility] INVASIVE CV LAB;  Service: Cardiovascular;  Laterality: N/A;   IABP INSERTION N/A 2020-09-21   Procedure: IABP Insertion;  Surgeon: Yates DecampGanji, Jay, MD;  Location[facility] INVASIVE CV LAB;  Service: Cardiovascular;  Laterality: N/A;   LEFT HEART CATH AND CORONARY ANGIOGRAPHY N/A Jan 21, 2021   Procedure: LEFT HEART CATH AND CORONARY ANGIOGRAPHY;  Surgeon: Yates DecampGanji, Jay, MD;   Location: [facility] INVASIVE CV LAB;  Service: Cardiovascular;  Laterality: N/A;     Social History:   reports that he has never smoked. He has never used smokeless tobacco. He reports that he does not drink alcohol and does not use drugs.   Family History:  His family history is not on file.   Allergies No Known Allergies   Home Medications  Prior to Admission medications   Medication Sig Start Date End Date Taking? Authorizing Provider  BIOTIN PO Take 1 tablet by mouth daily.   Yes [provider]  Cyanocobalamin ([facility] B12 PO) Take 1 tablet by mouth daily.   Yes [provider]  Pyridoxine HCl (VITAMIN B-6 PO) Take 1 tablet by mouth daily.   Yes [provider]  VITAMIN D PO Take 1 tablet by mouth daily.   Yes [provider]  VITAMIN E COMPLEX PO Take 1 tablet by mouth daily.   Yes [provider]     Critical care time: 40 minutes.      Bevelyn NgoSarah F. Eulah Walkup, AGACNP-BC [facility] Pulmonary/Critical Care Pager # [phone] OR # [phone] if no answer 09/08/2020

## 2020-09-08 NOTE — Progress Notes (Addendum)
CSW received call from nurse needing assist with finding family/emergency contact for patient. CSW unable to locate family for patient currently. CSW called non emergent [employer]. [employer] reported they have no family contacts listed for patient.[employer] will send someone out to patients home address to see if anyone else lives there.CSW will continue to follow and assist with trying to locate family/emergency contact for patient.

## 2020-09-08 NOTE — Progress Notes (Signed)
Subjective:  Throughout the course of yesterday patient developed tachycardia and became more agitated, therefore suspected he was in pulmonary edema.  Patient was therefore started on furosemide for acute decompensated heart failure and ABG revealed CO2 retention and decreased oxygen saturation. Pulmonary critical care was consulted and intubated patient as well as placed both an arterial and central line.  Patient seen and met at approximately 8:15 AM today. Intubated and sedated. Not presently on epinephrine drip. Blood pressure remains soft.   Intake/Output from previous day:  I/O last 3 completed shifts: In: 3036.4 [P.O.:540; I.V.:2496.4] Out: 2060 [Urine:2060] No intake/output data recorded.  Blood pressure 92/65, pulse (!) 110, temperature (!) 100.7 F (38.2 C), temperature source Oral, resp. rate 20, height 5' 9"  (1.753 m), weight 90.7 kg, SpO2 97 %. Physical Exam Vitals reviewed.  Constitutional:      Comments: Patient intubated and sedated.  Central line and arterial line in place.  HENT:     Head: Normocephalic and atraumatic.  Cardiovascular:     Rate and Rhythm: Regular rhythm. Tachycardia present.     Pulses: Intact distal pulses.     Heart sounds: S1 normal and S2 normal. Heart sounds are distant. No murmur heard.   No gallop.     Comments: Left foot cool to touch, right foot warm to touch.  Pedal pulses difficult to palpate. Pulmonary:     Breath sounds: No wheezing or rales.     Comments: Patient intubated and sedated. Abdominal:     General: Bowel sounds are normal. There is no distension.  Musculoskeletal:     Right lower leg: No edema.     Left lower leg: No edema.  Neurological:     Comments: Patient appears mildly confused. Oriented to self, place, time, and situation    Lab Results: BMP BNP (last 3 results) Recent Labs    09/07/20 0137  BNP 192.7*    ProBNP (last 3 results) No results for input(s): PROBNP in the last 8760 hours. BMP Latest  Ref Rng & Units 09/08/2020 09/08/2020 09/07/2020  Glucose 70 - 99 mg/dL - 156(H) 175(H)  BUN 8 - 23 mg/dL - 16 14  Creatinine 0.61 - 1.24 mg/dL - 1.36(H) 1.29(H)  Sodium 135 - 145 mmol/L 140 140 139  Potassium 3.5 - 5.1 mmol/L 3.7 3.6 3.8  Chloride 98 - 111 mmol/L - 107 110  CO2 22 - 32 mmol/L - 21(L) 21(L)  Calcium 8.9 - 10.3 mg/dL - 8.0(L) 8.1(L)   Hepatic Function Latest Ref Rng & Units 09/08/2020 09/07/2020 08/22/2020  Total Protein 6.5 - 8.1 g/dL 5.7(L) 5.7(L) 6.3(L)  Albumin 3.5 - 5.0 g/dL 2.8(L) 3.1(L) 3.5  AST 15 - 41 U/L 314(H) 683(H) 26  ALT 0 - 44 U/L 111(H) 143(H) 18  Alk Phosphatase 38 - 126 U/L 77 85 97  Total Bilirubin 0.3 - 1.2 mg/dL 1.1 0.9 0.6   CBC Latest Ref Rng & Units 09/08/2020 09/08/2020 09/07/2020  WBC 4.0 - 10.5 K/uL - 14.5(H) -  Hemoglobin 13.0 - 17.0 g/dL 12.6(L) 13.0 12.9(L)  Hematocrit 39.0 - 52.0 % 37.0(L) 38.5(L) 38.0(L)  Platelets 150 - 400 K/uL - 329 -   Lipid Panel     Component Value Date/Time   CHOL 182 09/01/2020 1551   TRIG 225 (H) 09/08/2020 1551   HDL 31 (L) 08/23/2020 1551   CHOLHDL 5.9 08/28/2020 1551   VLDL 45 (H) 08/24/2020 1551   LDLCALC 106 (H) 08/25/2020 1551   Cardiac Panel (last 3 results) No results  for input(s): CKTOTAL, CKMB, TROPONINI, RELINDX in the last 72 hours.  HEMOGLOBIN A1C Lab Results  Component Value Date   HGBA1C 5.3 09/07/2020   MPG 105 09/07/2020   TSH Recent Labs    09/01/2020 1859  TSH 2.235    Imaging: DG CHEST X-ray 09/07/2020 VIEW COMPARISON:  09/05/2020  FINDINGS: Mild bilateral interstitial thickening likely reflecting mild interstitial edema. No focal consolidation. No pleural effusion or pneumothorax. Heart and mediastinal contours are unremarkable. Intra-aortic balloon pump is noted with the metallic tip at the level of the aortic arch unchanged from the prior exam. No acute osseous abnormality.  IMPRESSION: Intra-aortic balloon pump is noted with the metallic tip at the level of the aortic arch  unchanged from the prior exam.   DG Chest X-ray 09/05/2020:  Cardiac shadow is within normal limits. Prior coronary stenting is seen. Intra-aortic balloon pump is noted just below the aortic knob. Lungs are well aerated bilaterally with mild interstitial edema.  IMPRESSION: Mild interstitial edema. Balloon pump in satisfactory position.  Cardiac Studies: Left heart catheterization 08/30/2020: LV 56/15, EDP 21 mmHg.  Aorta 75/44, mean 57 mmHg.  No pressure gradient across the aortic valve.  Markedly elevated EDP. LM: Large vessel, bifurcates into circumflex and LAD. LAD: Has ulcerated subtotally occluded proximal LAD stenosis that extends to the mid segment.  There is TIMI I flow also in the D1 and D2.  Moderate-sized D1 and small sized D2.  Mild disease in the mid to distal LAD. CX: Moderate sized vessel, gives origin to a large OM1, OM 2 is very small, there is a lesion after OM 2 at 80% focal lesion.  OM 3 is moderate sized with secondary branches. RCA: Dominant.  Proximal segment 90% stenosis, tandem 30 to 40% stenosis in the midsegment followed by a high-grade 90% stenosis.  Mild disease in the distal right coronary.   Intervention: Successful 2 overlapping stent implantation with 3.0 x 24 mm followed by 3.0 x 16 mm Synergy XD stents, stenosis reduced from 99% to 0%, TIMI I improved to TIMI-3 flow.   Recommendation: Patient will need relook at the LAD stent in view of severe spasm and cardiogenic shock and need for pressor support in the cardiac catheterization lab.  Patient was started on Levophed due to low blood pressure and intra-aortic balloon pump was inserted percutaneously prior to angioplasty.  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  115 mL contrast utilized.  Echocardiogram 09/07/2020:   1. Left ventricular ejection fraction, by estimation, is 20 to 25%. The  left  ventricle has severely decreased function. The left ventricle  demonstrates regional wall motion abnormalities (see scoring  diagram/findings for description). Left ventricular  diastolic parameters were normal. There is akinesis of the left  ventricular, entire anterior wall, anterolateral wall and apical segment.   2. Right ventricular systolic function is normal. The right ventricular  size is normal. There is mildly elevated pulmonary artery systolic  pressure. The estimated right ventricular systolic pressure is 68.1 mmHg.   3. Left atrial size was mildly dilated.   4. The mitral valve is normal in structure. No evidence of mitral valve  regurgitation. No evidence of mitral stenosis.   5. The aortic valve is normal in structure. Aortic valve regurgitation is  not visualized. No aortic stenosis is present.  EKG:  09/07/2020 1850: Sinus rhythm at a rate of 91 bpm.  Right axis.  Anteroseptal infarct, also noted previously. 08/20/2020: 1557 hrs.: Ventricular fibrillation. 09/10/2020 at 1550 hrs.: Anteroseptal and high lateral STEMI.   Telemetry: Sinus rhythm with runs of nonsustained ventricular tachycardia 6-8 beats  Scheduled Meds:  aspirin  81 mg Per Tube Daily   atorvastatin  80 mg Per Tube Daily   chlorhexidine gluconate (MEDLINE KIT)  15 mL Mouth Rinse BID   Chlorhexidine Gluconate Cloth  6 each Topical Daily   docusate  100 mg Per Tube BID   furosemide  40 mg Intravenous BID   heparin injection (subcutaneous)  5,000 Units Subcutaneous Q8H   insulin aspart  0-15 Units Subcutaneous Q4H   mouth rinse  15 mL Mouth Rinse 10 times per day   nitroGLYCERIN  1 inch Topical Once   pantoprazole (PROTONIX) IV  40 mg Intravenous Daily   polyethylene glycol  17 g Per Tube Daily   sodium chloride flush  3 mL Intravenous Q12H   ticagrelor  90 mg Per Tube BID   Continuous Infusions:  sodium chloride Stopped (09/07/20 0119)   sodium chloride Stopped (09/07/20 1513)   sodium chloride      epinephrine 3 mcg/min (09/08/20 0700)   fentaNYL infusion INTRAVENOUS 125 mcg/hr (09/08/20 0700)   norepinephrine (LEVOPHED) Adult infusion     PRN Meds:.Place/Maintain arterial line **AND** sodium chloride, acetaminophen (TYLENOL) oral liquid 160 mg/5 mL, fentaNYL, midazolam, nitroGLYCERIN, ondansetron (ZOFRAN) IV, sodium chloride flush  Assessment/Plan:   1.  Acute anterior and lateral STEMI, ST elevation MI. 2.  V. fib arrest - 09/05/2020  3.  Cardiogenic shock 4.  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 Pulmonary edema   Has deteriorated over the course of yesterday and through the night.  He is now intubated and sedated due to CO2 retention and oxygen desaturation.  Prognosis remains guarded.  Continue diuresis with Lasix.  Also continue guideline directed medical therapy for CAD including aspirin, Brilinta, and atorvastatin.  We will hold off on initiating beta-blocker therapy given acute decompensated heart failure as well as hypotension.  Critical care team recommendations and management are greatly appreciated. Agree with initiation of milrinone and use of levophed. Will continue to monitor renal function closely.   In regard to intubation, plan is to wean sedation as tolerated to facilitate extubation.   Patient was seen in collaboration with Dr. Einar Gip. He also reviewed patient's chart and examined the patient. Dr. Einar Gip is in agreement of the plan.      Alethia Berthold, PA-C 09/08/2020, 7:48 AM Office: [phone]

## 2020-09-08 NOTE — Progress Notes (Signed)
eLink Physician-Brief Progress Note Patient Name: Jesse Lowe DOB: [birth year] MRN: [id number]   Date of Service  09/08/2020  HPI/Events of Note  Lactic acid 4.3 from 3.4 without increase  pressor requirement. Adequate UO  eICU Interventions  Will continue to monitor for now     Intervention Category Intermediate Interventions: Other:  Darl Pikes 09/08/2020, 12:02 AM

## 2020-09-08 NOTE — Progress Notes (Signed)
ANTICOAGULATION CONSULT NOTE  Pharmacy Consult for heparin Indication: chest pain/ACS  No Known Allergies  Patient Measurements: Height: 5\' 9"  (175.3 cm) Weight: 90.7 kg (200 lb) IBW/kg (Calculated) : 70.7 Heparin Dosing Weight: 90kg  Vital Signs: Temp: 100.7 F (38.2 C) (06/21 0400) Temp Source: Oral (06/21 0400) BP: 119/82 (06/21 1100) Pulse Rate: 118 (06/21 1100)  Labs: Recent Labs    08/30/2020 1551 09/15/2020 1859 09/07/20 0137 09/07/20 1533 09/07/20 1814 09/07/20 1818 09/08/20 0418 09/08/20 0430  HGB 13.8  --  12.9*   < > 12.9*  --  13.0 12.6*  HCT 38.3*  --  36.6*   < > 38.0*  --  38.5* 37.0*  PLT 361  --  324  --   --   --  329  --   APTT 84*  --   --   --   --   --   --   --   LABPROT 14.2  --   --   --   --  14.8  --   --   INR 1.1  --   --   --   --  1.2  --   --   HEPARINUNFRC  --   --  0.27*  --   --   --   --   --   CREATININE 1.21  --  1.26*  --   --  1.29* 1.36*  --   TROPONINIHS 29* >24,000* >24,000*  --   --   --   --   --    < > = values in this interval not displayed.     Estimated Creatinine Clearance: 57.9 mL/min (A) (by C-G formula based on SCr of 1.36 mg/dL (H)).   Medical History: History reviewed. No pertinent past medical history.  Medications:  Medications Prior to Admission  Medication Sig Dispense Refill Last Dose   BIOTIN PO Take 1 tablet by mouth daily.   08/23/2020   Cyanocobalamin (CVS B12 PO) Take 1 tablet by mouth daily.   08/22/2020   Pyridoxine HCl (VITAMIN B-6 PO) Take 1 tablet by mouth daily.   09/05/2020   VITAMIN D PO Take 1 tablet by mouth daily.   09/15/2020   VITAMIN E COMPLEX PO Take 1 tablet by mouth daily.   08/27/2020    Assessment: 70 year old male presenting to [facility] with chest pain, code stemi initiated. Patient now s/p stent to LAD and IABP inserted for support. No anticoagulants prior to admit.   IABP removed 6/20 and pt started on SQ heparin for VTE ppx. Now pharmacy asked to resume heparin with concern for  ongoing ischemia.  Goal of Therapy:  Heparin level 0.3-0.7 units/ml Monitor platelets by anticoagulation protocol: Yes   Plan:  Heparin 1000 units/h no bolus Check heparin level in 6h   7/20, PharmD, [place], [facility] Clinical Pharmacist [phone] Please check AMION for all [facility] Pharmacy numbers 09/08/2020

## 2020-09-08 NOTE — Progress Notes (Signed)
ANTICOAGULATION CONSULT NOTE  Pharmacy Consult for heparin Indication: chest pain/ACS  No Known Allergies  Patient Measurements: Height: 5\' 9"  (175.3 cm) Weight: 90.7 kg (200 lb) IBW/kg (Calculated) : 70.7 Heparin Dosing Weight: 90kg  Vital Signs: Temp: 99.9 F (37.7 C) (06/21 1130) Temp Source: Oral (06/21 1130) BP: 90/63 (06/21 1915) Pulse Rate: 105 (06/21 1815)  Labs: Recent Labs    2020/09/20 1551 2020/09/20 1859 09/07/20 0137 09/07/20 1533 09/07/20 1814 09/07/20 1818 09/08/20 0418 09/08/20 0430 09/08/20 1818  HGB 13.8  --  12.9*   < > 12.9*  --  13.0 12.6*  --   HCT 38.3*  --  36.6*   < > 38.0*  --  38.5* 37.0*  --   PLT 361  --  324  --   --   --  329  --   --   APTT 84*  --   --   --   --   --   --   --   --   LABPROT 14.2  --   --   --   --  14.8  --   --   --   INR 1.1  --   --   --   --  1.2  --   --   --   HEPARINUNFRC  --   --  0.27*  --   --   --   --   --  <0.10*  CREATININE 1.21  --  1.26*  --   --  1.29* 1.36*  --   --   TROPONINIHS 29* >24,000* >24,000*  --   --   --   --   --   --    < > = values in this interval not displayed.     Estimated Creatinine Clearance: 57.9 mL/min (A) (by C-G formula based on SCr of 1.36 mg/dL (H)).   Medical History: History reviewed. No pertinent past medical history.  Medications:  Medications Prior to Admission  Medication Sig Dispense Refill Last Dose   BIOTIN PO Take 1 tablet by mouth daily.   09/20/20   Cyanocobalamin ([employer] B12 PO) Take 1 tablet by mouth daily.   09/20/20   Pyridoxine HCl (VITAMIN B-6 PO) Take 1 tablet by mouth daily.   September 20, 2020   VITAMIN D PO Take 1 tablet by mouth daily.   20-Sep-2020   VITAMIN E COMPLEX PO Take 1 tablet by mouth daily.   09/20/2020    Assessment: 70 year old male presenting to [facility] with chest pain, code stemi initiated. Patient now s/p stent to LAD and IABP inserted for support. No anticoagulants prior to admit.   IABP removed 6/20. Pharmacy asked to resume heparin with  concern for ongoing ischemia.  Heparin level undetectable on gtt at 1000 units/hr. No issues with line or bleeding reported per RN.  Goal of Therapy:  Heparin level 0.3-0.7 units/ml Monitor platelets by anticoagulation protocol: Yes   Plan:  Increase heparin to 1250 units/hr Check heparin level in 6 hr  7/20, PharmD, BCPS Please see amion for complete clinical pharmacist phone list 09/08/2020

## 2020-09-08 NOTE — Progress Notes (Signed)
Initial Nutrition Assessment  DOCUMENTATION CODES:   Non-severe (moderate) malnutrition in context of chronic illness  INTERVENTION:   Tube Feeding via OG:  Vital 1.5 at 55 ml/hr Pro-Source TF 45 mL BID Provides 111 g of protein, 2060 kcals and 1003 mL of free water  NUTRITION DIAGNOSIS:   Moderate Malnutrition related to chronic illness as evidenced by mild muscle depletion, mild fat depletion.  GOAL:   Patient will meet greater than or equal to 90% of their needs  MONITOR:   Vent status, TF tolerance, Labs, Weight trends  REASON FOR ASSESSMENT:   Consult, Ventilator Enteral/tube feeding initiation and management  ASSESSMENT:   70 yo admitted with chest pain, Code STEMI activated. While waiting i, pt had VF arrest, taking to cath lab and underwent stent to LAD and IABP, intubated. PMH includes HNT, HLD; pt had not seen MD in 10-15 years  6/19 Admitted with STEMI, VF arrest, Cath Lab with stent to LAD and IABP  6/20 IABP removed  Pt remains sedated on vent support; unable to obtain diet and weight history at this time.   OG tube enters stomach per chest xray  Current wt 90.7 kg; no previous wt encounters  Labs: reviewed Meds: colace, lasix, ss novolog, miralax  NUTRITION - FOCUSED PHYSICAL EXAM:  Flowsheet Row Most Recent Value  Orbital Region Mild depletion  Upper Arm Region Mild depletion  Thoracic and Lumbar Region Mild depletion  Buccal Region Unable to assess  Temple Region Mild depletion  Clavicle Bone Region Mild depletion  Clavicle and Acromion Bone Region Mild depletion  Scapular Bone Region Mild depletion  Dorsal Hand Unable to assess  Patellar Region Mild depletion  Anterior Thigh Region Mild depletion  Posterior Calf Region Mild depletion  Edema (RD Assessment) None       Diet Order:   Diet Order             Diet NPO time specified  Diet effective now                   EDUCATION NEEDS:   Not appropriate for education at this  time  Skin:  Skin Assessment: Reviewed RN Assessment  Last BM:  6/20  Height:   Ht Readings from Last 1 Encounters:  September 30, 2020 5\' 9"  (1.753 m)    Weight:   Wt Readings from Last 1 Encounters:  30-Sep-2020 90.7 kg     BMI:  Body mass index is 29.53 kg/m.  Estimated Nutritional Needs:   Kcal:  2000-2200 kcals  Protein:  110-130 g  Fluid:  >/= 2L    09/08/20 MS, RDN, LDN, CNSC Registered Dietitian III Clinical Nutrition RD Pager and On-Call Pager Number Located in [place]

## 2020-09-08 NOTE — Progress Notes (Signed)
Pt is easy to wake from sedation but he begins to panic and move around in the bed. Pt does not follow any commands when he is alert. Pt tries to pull at the ET Tube.

## 2020-09-09 ENCOUNTER — Inpatient Hospital Stay (HOSPITAL_COMMUNITY): Payer: Medicare Other

## 2020-09-09 DIAGNOSIS — G9341 Metabolic encephalopathy: Secondary | ICD-10-CM | POA: Diagnosis not present

## 2020-09-09 DIAGNOSIS — J9601 Acute respiratory failure with hypoxia: Secondary | ICD-10-CM | POA: Diagnosis not present

## 2020-09-09 LAB — COMPREHENSIVE METABOLIC PANEL
ALT: 80 U/L — ABNORMAL HIGH (ref 0–44)
AST: 161 U/L — ABNORMAL HIGH (ref 15–41)
Albumin: 2.6 g/dL — ABNORMAL LOW (ref 3.5–5.0)
Alkaline Phosphatase: 86 U/L (ref 38–126)
Anion gap: 5 (ref 5–15)
BUN: 23 mg/dL (ref 8–23)
CO2: 27 mmol/L (ref 22–32)
Calcium: 8.1 mg/dL — ABNORMAL LOW (ref 8.9–10.3)
Chloride: 106 mmol/L (ref 98–111)
Creatinine, Ser: 1.28 mg/dL — ABNORMAL HIGH (ref 0.61–1.24)
GFR, Estimated: >60 mL/min (ref >60)
Glucose, Bld: 131 mg/dL — ABNORMAL HIGH (ref 70–99)
Potassium: 3.5 mmol/L (ref 3.5–5.1)
Sodium: 138 mmol/L (ref 135–145)
Total Bilirubin: 1 mg/dL (ref 0.3–1.2)
Total Protein: 5.4 g/dL — ABNORMAL LOW (ref 6.5–8.1)

## 2020-09-09 LAB — POCT I-STAT 7, (LYTES, BLD GAS, ICA,H+H)
Acid-Base Excess: 2 mmol/L (ref 0.0–2.0)
Bicarbonate: 25.7 mmol/L (ref 20.0–28.0)
Calcium, Ion: 1.18 mmol/L (ref 1.15–1.40)
HCT: 33 % — ABNORMAL LOW (ref 39.0–52.0)
Hemoglobin: 11.2 g/dL — ABNORMAL LOW (ref 13.0–17.0)
O2 Saturation: 96 %
Patient temperature: 100.6
Potassium: 3.6 mmol/L (ref 3.5–5.1)
Sodium: 138 mmol/L (ref 135–145)
TCO2: 27 mmol/L (ref 22–32)
pCO2 arterial: 37 mmHg (ref 32.0–48.0)
pH, Arterial: 7.454 — ABNORMAL HIGH (ref 7.350–7.450)
pO2, Arterial: 84 mmHg (ref 83.0–108.0)

## 2020-09-09 LAB — CBC
HCT: 35.9 % — ABNORMAL LOW (ref 39.0–52.0)
Hemoglobin: 12 g/dL — ABNORMAL LOW (ref 13.0–17.0)
MCH: 30.8 pg (ref 26.0–34.0)
MCHC: 33.4 g/dL (ref 30.0–36.0)
MCV: 92.1 fL (ref 80.0–100.0)
Platelets: 257 10*3/uL (ref 150–400)
RBC: 3.9 MIL/uL — ABNORMAL LOW (ref 4.22–5.81)
RDW: 14 % (ref 11.5–15.5)
WBC: 13 10*3/uL — ABNORMAL HIGH (ref 4.0–10.5)
nRBC: 0 % (ref 0.0–0.2)

## 2020-09-09 LAB — GLUCOSE, CAPILLARY
Glucose-Capillary: 119 mg/dL — ABNORMAL HIGH (ref 70–99)
Glucose-Capillary: 128 mg/dL — ABNORMAL HIGH (ref 70–99)
Glucose-Capillary: 128 mg/dL — ABNORMAL HIGH (ref 70–99)
Glucose-Capillary: 129 mg/dL — ABNORMAL HIGH (ref 70–99)
Glucose-Capillary: 131 mg/dL — ABNORMAL HIGH (ref 70–99)
Glucose-Capillary: 142 mg/dL — ABNORMAL HIGH (ref 70–99)
Glucose-Capillary: 166 mg/dL — ABNORMAL HIGH (ref 70–99)

## 2020-09-09 LAB — HEPARIN LEVEL (UNFRACTIONATED): Heparin Unfractionated: 0.19 IU/mL — ABNORMAL LOW (ref 0.30–0.70)

## 2020-09-09 LAB — MAGNESIUM: Magnesium: 2.1 mg/dL (ref 1.7–2.4)

## 2020-09-09 LAB — COOXEMETRY PANEL
Carboxyhemoglobin: 0.9 % (ref 0.5–1.5)
Methemoglobin: 0.9 % (ref 0.0–1.5)
O2 Saturation: 76 %
Total hemoglobin: 12.2 g/dL (ref 12.0–16.0)

## 2020-09-09 LAB — CK TOTAL AND CKMB (NOT AT ARMC)
CK, MB: 19.6 ng/mL — ABNORMAL HIGH (ref 0.5–5.0)
Relative Index: 2.2 (ref 0.0–2.5)
Total CK: 880 U/L — ABNORMAL HIGH (ref 49–397)

## 2020-09-09 LAB — TROPONIN I (HIGH SENSITIVITY): Troponin I (High Sensitivity): >24000 ng/L (ref <18)

## 2020-09-09 LAB — BRAIN NATRIURETIC PEPTIDE: B Natriuretic Peptide: 1335.2 pg/mL — ABNORMAL HIGH (ref 0.0–100.0)

## 2020-09-09 LAB — LACTIC ACID, PLASMA: Lactic Acid, Venous: 1.3 mmol/L (ref 0.5–1.9)

## 2020-09-09 MED ORDER — ORAL CARE MOUTH RINSE: 15.0000 mL | Freq: Two times a day (BID) | OROMUCOSAL | Status: DC

## 2020-09-09 MED ORDER — CHLORHEXIDINE GLUCONATE 0.12 % MT SOLN
15.0000 mL | Freq: Two times a day (BID) | OROMUCOSAL | Status: DC
  Administered 2020-09-09 – 2020-09-10 (×2): 15 mL via OROMUCOSAL
  Filled 2020-09-09 (×2): qty 15

## 2020-09-09 MED ORDER — ORAL CARE MOUTH RINSE
15.0000 mL | Freq: Two times a day (BID) | OROMUCOSAL | Status: DC
  Administered 2020-09-09: 15 mL via OROMUCOSAL

## 2020-09-09 MED ORDER — THIAMINE HCL 100 MG/ML IJ SOLN
100.0000 mg | Freq: Every day | INTRAMUSCULAR | Status: DC
  Administered 2020-09-09 – 2020-09-10 (×2): 100 mg via INTRAVENOUS
  Filled 2020-09-09 (×2): qty 2

## 2020-09-09 MED ORDER — CHLORHEXIDINE GLUCONATE 0.12 % MT SOLN: 15.0000 mL | Freq: Two times a day (BID) | OROMUCOSAL | Status: DC

## 2020-09-09 MED ORDER — FOLIC ACID 5 MG/ML IJ SOLN
1.0000 mg | Freq: Every day | INTRAMUSCULAR | Status: DC
  Administered 2020-09-09 – 2020-09-11 (×3): 1 mg via INTRAVENOUS
  Filled 2020-09-09 (×3): qty 0.2

## 2020-09-09 MED ORDER — FUROSEMIDE 10 MG/ML IJ SOLN
80.0000 mg | Freq: Two times a day (BID) | INTRAMUSCULAR | Status: AC
  Administered 2020-09-09 – 2020-09-10 (×3): 80 mg via INTRAVENOUS
  Filled 2020-09-09 (×3): qty 8

## 2020-09-09 MED ORDER — ENOXAPARIN SODIUM 40 MG/0.4ML IJ SOSY
40.0000 mg | PREFILLED_SYRINGE | INTRAMUSCULAR | Status: DC
  Administered 2020-09-09 – 2020-09-11 (×3): 40 mg via SUBCUTANEOUS
  Filled 2020-09-09 (×3): qty 0.4

## 2020-09-09 MED ORDER — POTASSIUM CHLORIDE 20 MEQ PO PACK
40.0000 meq | PACK | Freq: Once | ORAL | Status: AC
  Administered 2020-09-09: 40 meq
  Filled 2020-09-09: qty 2

## 2020-09-09 NOTE — Plan of Care (Signed)
  Problem: Education: Goal: Knowledge of General Education information will improve Description: Including pain rating scale, medication(s)/side effects and non-pharmacologic comfort measures Outcome: Progressing   Problem: Health Behavior/Discharge Planning: Goal: Ability to manage health-related needs will improve Outcome: Progressing   Problem: Clinical Measurements: Goal: Ability to maintain clinical measurements within normal limits will improve Outcome: Progressing Goal: Will remain free from infection Outcome: Progressing Goal: Diagnostic test results will improve Outcome: Progressing Goal: Respiratory complications will improve Outcome: Progressing Goal: Cardiovascular complication will be avoided Outcome: Progressing   Problem: Nutrition: Goal: Adequate nutrition will be maintained Outcome: Progressing   Problem: Coping: Goal: Level of anxiety will decrease Outcome: Progressing   Problem: Elimination: Goal: Will not experience complications related to bowel motility Outcome: Progressing Goal: Will not experience complications related to urinary retention Outcome: Progressing   Problem: Pain Managment: Goal: General experience of comfort will improve Outcome: Progressing   Problem: Education: Goal: Ability to demonstrate management of disease process will improve Outcome: Progressing Goal: Ability to verbalize understanding of medication therapies will improve Outcome: Progressing Goal: Individualized Educational Video(s) Outcome: Progressing   Problem: Activity: Goal: Capacity to carry out activities will improve Outcome: Progressing   Problem: Cardiac: Goal: Ability to achieve and maintain adequate cardiopulmonary perfusion will improve Outcome: Progressing

## 2020-09-09 NOTE — Progress Notes (Signed)
Date and time results received: 09/09/20 1840  Test: troponin  Critical Value: > 24,000  Name of Provider Notified: Agarwala MD  Orders Received? Or Actions Taken?: none

## 2020-09-09 NOTE — Progress Notes (Addendum)
Subjective:  Patient remains intubated.  Has been On urine output.  Temperature of 102 F this morning.  Intake/Output from previous day:  I/O last 3 completed shifts: In: 1994.1 [I.V.:1014.1; NG/GT:980] Out: 3685 [Urine:3685] No intake/output data recorded.  Blood pressure 102/72, pulse (!) 107, temperature (!) 101.9 F (38.8 C), temperature source Oral, resp. rate 18, height 5' 9"  (1.753 m), weight 90.7 kg, SpO2 96 %.  Physical Exam Vitals reviewed.  Constitutional:      Comments: Patient intubated and sedated.  Central line and arterial line in place.  HENT:     Head: Normocephalic and atraumatic.  Cardiovascular:     Rate and Rhythm: Regular rhythm. Tachycardia present.     Pulses: Normal pulses and intact distal pulses.          Femoral pulses are 2+ on the right side and 2+ on the left side.      Dorsalis pedis pulses are 2+ on the right side and 2+ on the left side.       Posterior tibial pulses are 2+ on the right side and 2+ on the left side.     Heart sounds: S1 normal and S2 normal. Heart sounds are distant. No murmur heard.   No gallop.  Pulmonary:     Breath sounds: No wheezing or rales.     Comments: Patient intubated and sedated. Abdominal:     General: Bowel sounds are normal. There is no distension.  Musculoskeletal:     Right lower leg: No edema.     Left lower leg: No edema.  Skin:    General: Skin is warm.     Capillary Refill: Capillary refill takes less than 2 seconds.    Lab Results: BMP BNP (last 3 results) Recent Labs    09/07/20 0137 09/09/20 0233  BNP 192.7* 1,335.2*     ProBNP (last 3 results) No results for input(s): PROBNP in the last 8760 hours. BMP Latest Ref Rng & Units 09/09/2020 09/09/2020 09/08/2020  Glucose 70 - 99 mg/dL - 131(H) -  BUN 8 - 23 mg/dL - 23 -  Creatinine 0.61 - 1.24 mg/dL - 1.28(H) -  Sodium 135 - 145 mmol/L 138 138 140  Potassium 3.5 - 5.1 mmol/L 3.6 3.5 3.7  Chloride 98 - 111 mmol/L - 106 -  CO2 22 - 32  mmol/L - 27 -  Calcium 8.9 - 10.3 mg/dL - 8.1(L) -   Hepatic Function Latest Ref Rng & Units 09/09/2020 09/08/2020 09/07/2020  Total Protein 6.5 - 8.1 g/dL 5.4(L) 5.7(L) 5.7(L)  Albumin 3.5 - 5.0 g/dL 2.6(L) 2.8(L) 3.1(L)  AST 15 - 41 U/L 161(H) 314(H) 683(H)  ALT 0 - 44 U/L 80(H) 111(H) 143(H)  Alk Phosphatase 38 - 126 U/L 86 77 85  Total Bilirubin 0.3 - 1.2 mg/dL 1.0 1.1 0.9   CBC Latest Ref Rng & Units 09/09/2020 09/09/2020 09/08/2020  WBC 4.0 - 10.5 K/uL - 13.0(H) -  Hemoglobin 13.0 - 17.0 g/dL 11.2(L) 12.0(L) 12.6(L)  Hematocrit 39.0 - 52.0 % 33.0(L) 35.9(L) 37.0(L)  Platelets 150 - 400 K/uL - 257 -   Lipid Panel     Component Value Date/Time   CHOL 182 09/10/2020 1551   TRIG 225 (H) 09/15/2020 1551   HDL 31 (L) 08/22/2020 1551   CHOLHDL 5.9 08/28/2020 1551   VLDL 45 (H) 09/09/2020 1551   LDLCALC 106 (H) 08/29/2020 1551   Cardiac Panel (last 3 results) Recent Labs    09/09/20 0233  CKTOTAL 880*  CKMB 19.6*  RELINDX 2.2    HEMOGLOBIN A1C Lab Results  Component Value Date   HGBA1C 5.3 09/07/2020   MPG 105 09/07/2020   TSH Recent Labs    08/22/2020 1859  TSH 2.235    Imaging: Portable chest x-ray 09/09/2020:  Central lines and tubes in stable position.  Cardiomegaly again noted.  Bilateral pulmonary infiltrates/edema again noted.  Findings suggestive of CHF with bilateral pleural effusion.  DG Chest X-ray 09/07/2020:  Cardiac shadow is within normal limits. Prior coronary stenting is seen. Intra-aortic balloon pump is noted just below the aortic knob. Lungs are well aerated bilaterally with mild interstitial edema.  IMPRESSION: Mild interstitial edema. Balloon pump in satisfactory position.  Cardiac Studies: Left heart catheterization 09/09/2020: LV 56/15, EDP 21 mmHg.  Aorta 75/44, mean 57 mmHg.  No pressure gradient across the aortic valve.  Markedly elevated EDP. LM: Large vessel, bifurcates into circumflex and LAD. LAD: Has ulcerated subtotally occluded  proximal LAD stenosis that extends to the mid segment.  There is TIMI I flow also in the D1 and D2.  Moderate-sized D1 and small sized D2.  Mild disease in the mid to distal LAD. CX: Moderate sized vessel, gives origin to a large OM1, OM 2 is very small, there is a lesion after OM 2 at 80% focal lesion.  OM 3 is moderate sized with secondary branches. RCA: Dominant.  Proximal segment 90% stenosis, tandem 30 to 40% stenosis in the midsegment followed by a high-grade 90% stenosis.  Mild disease in the distal right coronary.   Intervention: Successful 2 overlapping stent implantation with 3.0 x 24 mm followed by 3.0 x 16 mm Synergy XD stents, stenosis reduced from 99% to 0%, TIMI I improved to TIMI-3 flow.   Recommendation: Patient will need relook at the LAD stent in view of severe spasm and cardiogenic shock and need for pressor support in the cardiac catheterization lab.  Patient was started on Levophed due to low blood pressure and intra-aortic balloon pump was inserted percutaneously prior to angioplasty.  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  115 mL contrast utilized.  Echocardiogram 09/07/2020:   1. Left ventricular ejection fraction, by estimation, is 20 to 25%. The left ventricle has severely decreased function. The left ventricle demonstrates regional wall motion abnormalities (see scoring diagram/findings for description). Left ventricular diastolic parameters were normal. There is akinesis of the left ventricular, entire anterior wall, anterolateral wall and apical segment.   2. Right ventricular systolic function is normal. The right ventricular size is normal. There is mildly elevated pulmonary artery systolic pressure. The estimated right ventricular systolic pressure is 57.8 mmHg.   3. Left atrial size was mildly dilated.   4. The mitral valve is normal in structure. No  evidence of mitral valve regurgitation. No evidence of mitral stenosis.   5. The aortic valve is normal in structure. Aortic valve regurgitation is not visualized. No aortic stenosis is present.  EKG:  09/07/2020 1850: Sinus rhythm at a rate of 91 bpm.  Right axis.  Anteroseptal infarct, also noted previously. 09/03/2020: 1557 hrs.: Ventricular fibrillation. 08/20/2020 at 1550 hrs.: Anteroseptal and high lateral STEMI.   Telemetry: Sinus rhythm with runs of nonsustained ventricular tachycardia 6-8 beats  Scheduled Meds:  aspirin  81 mg Per Tube Daily   atorvastatin  80 mg Per Tube Daily   chlorhexidine gluconate (MEDLINE KIT)  15  mL Mouth Rinse BID   Chlorhexidine Gluconate Cloth  6 each Topical Daily   docusate  100 mg Per Tube BID   feeding supplement (PROSource TF)  45 mL Per Tube BID   insulin aspart  0-15 Units Subcutaneous Q4H   ivabradine  7.5 mg Per Tube BID WC   mouth rinse  15 mL Mouth Rinse 10 times per day   nitroGLYCERIN  1 inch Topical Once   pantoprazole (PROTONIX) IV  40 mg Intravenous Daily   polyethylene glycol  17 g Per Tube Daily   QUEtiapine  25 mg Per Tube Daily   sodium chloride flush  3 mL Intravenous Q12H   ticagrelor  90 mg Per Tube BID   Continuous Infusions:  sodium chloride Stopped (09/07/20 0119)   sodium chloride Stopped (09/07/20 1513)   sodium chloride     epinephrine Stopped (09/08/20 1108)   feeding supplement (VITAL 1.5 CAL) 55 mL/hr at 09/09/20 0600   fentaNYL infusion INTRAVENOUS 125 mcg/hr (09/09/20 0627)   heparin 1,500 Units/hr (09/09/20 0850)   milrinone 0.125 mcg/kg/min (09/09/20 0600)   norepinephrine (LEVOPHED) Adult infusion 3 mcg/min (09/09/20 0600)   PRN Meds:.Place/Maintain arterial line **AND** sodium chloride, acetaminophen (TYLENOL) oral liquid 160 mg/5 mL, fentaNYL, midazolam, nitroGLYCERIN, ondansetron (ZOFRAN) IV, sodium chloride flush  Assessment/Plan:   1.  Acute anterior and lateral STEMI, ST elevation MI. 2.  V. fib  arrest - 09/01/2020.  No further significant ventricular arrhythmias on telemetry. 3.  Cardiogenic shock 4.  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 Pulmonary edema  6.  Acute renal failure secondary to ATN from cardiogenic shock. 7.  Abnormal LFTs, secondary to cardiogenic shock, improving. 8.  Patient did not have any family members and when asked on admission, did not express that anyone should be called.  Recommendation: Patient empirically started on Corlanor.  We will try this for additional 24 to 48 hours if no response to heart rate, we can certainly discontinue this.  This morning his urine output has significantly improved.  Chest x-ray suggest pulm edema with bilateral pleural effusions,  CVP mildly elevated.  Continue diuretics for now.  Patient is presently on low-dose of Levophed at 2 mcg and Primacor, continue the same.  From cardiac standpoint we can discontinue heparin.  Continue dual antiplatelet therapy for now.  Continue to trend his creatinine and urine output.  Discussed with pulmonary critical care.  Critical care time 40 minutes in evaluation of his medications, hemodynamic parameters and management of acute decompensated heart failure.   Adrian Prows, MD, [facility] 09/09/2020, 9:03 AM Office: [phone] Fax: [phone] Pager: [phone]

## 2020-09-09 NOTE — Progress Notes (Addendum)
[employer] called CSW. [employer] confirmed they went out to patients home. No one answered. Patient comes from home alone. [employer] reported no friends,relatives, have been seen visiting patient. Patient has no PCP or medical records that can assist with locating family/emergency contact for patient.TOC leadership notified.CSW will continue to assist with trying to help locate family/emergency contact for patient.

## 2020-09-09 NOTE — Progress Notes (Signed)
ANTICOAGULATION CONSULT NOTE - Follow Up Consult  Pharmacy Consult for heparin Indication:  ACS   Labs: Recent Labs    09/13/2020 1551 08/21/2020 1859 09/07/20 0137 09/07/20 1533 09/07/20 1818 09/08/20 0418 09/08/20 0430 09/08/20 1818 09/09/20 0233  HGB 13.8  --  12.9*   < >  --  13.0 12.6*  --  12.0*  HCT 38.3*  --  36.6*   < >  --  38.5* 37.0*  --  35.9*  PLT 361  --  324  --   --  329  --   --  257  APTT 84*  --   --   --   --   --   --   --   --   LABPROT 14.2  --   --   --  14.8  --   --   --   --   INR 1.1  --   --   --  1.2  --   --   --   --   HEPARINUNFRC  --   --  0.27*  --   --   --   --  <0.10* 0.19*  CREATININE 1.21  --  1.26*  --  1.29* 1.36*  --   --   --   TROPONINIHS 29* >24,000* >24,000*  --   --   --   --   --   --    < > = values in this interval not displayed.    Assessment: 70yo male remains subtherapeutic on heparin after rate change; no gtt issues or signs of bleeding per RN.  Goal of Therapy:  Heparin level 0.3-0.7 units/ml   Plan:  Will increase heparin gtt by 2-3 units/kg/hr to 1500 units/hr and check level in 6 hours.    Vernard Gambles, PharmD, BCPS  09/09/2020,3:25 AM

## 2020-09-09 NOTE — Progress Notes (Signed)
Critical care attending attestation note:  Patient seen and examined and relevant ancillary tests reviewed.  I agree with the assessment and plan of care as outlined by Devra Dopp, NP.   Synopsis of assessment and plan:  70 year old man who is critically ill due to acute hypoxic hypercapnic respiratory failure due to acute pulmonary edema caused by cardiogenic shock following anterior STEMI status post successful PCI.  Requires mechanical ventilation and titration of inotropes and vasopressor infusions.  With sedation wean will wake up and follow commands.  There is no edema.  Chest is clear.  Heart sounds are unremarkable.  SCV O2 is 76% on current support.  BNP remains elevated at 1335. He is only 300 cc net negative since since admission. Remains stable at 1.28.  Patient self extubated.  Remains in hypoactive delirium off all infusions.  Will support with BiPAP.  Continue to diurese.  If tolerates overnight plan to stop milrinone and follow SCV O2 and then wean off norepinephrine.  CRITICAL CARE Performed by: Lynnell Catalan   Total critical care time: 40 minutes  Critical care time was exclusive of separately billable procedures and treating other patients.  Critical care was necessary to treat or prevent imminent or life-threatening deterioration.  Critical care was time spent personally by me on the following activities: development of treatment plan with patient and/or surrogate as well as nursing, discussions with consultants, evaluation of patient's response to treatment, examination of patient, obtaining history from patient or surrogate, ordering and performing treatments and interventions, ordering and review of laboratory studies, ordering and review of radiographic studies, pulse oximetry, re-evaluation of patient's condition and participation in multidisciplinary rounds.  Lynnell Catalan, MD [facility] ICU Physician [facility]  Pager: [phone] Mobile:  [phone] After hours: [phone].  09/09/2020, 2:45 PM

## 2020-09-09 NOTE — Progress Notes (Signed)
NAME:  Jesse Lowe, MRN:  [id number], DOB:  [birth year], LOS: 3 ADMISSION DATE:  Sep 25, 2020, CONSULTATION DATE:  6/20 REFERRING MD:  Jacinto Halim, CHIEF COMPLAINT:  delirium and respiratory distress    History of Present Illness:  70 yr old male who was admitted 6/19 w/ cc: severe crushing CP. Code STEMI activated. While waiting had VF arrest req'd ACLS w/ defib, amio epi; estimated 5 minutes to ROSC. Was awake and alert after ROSC obtained. Went to cath lab emergently. Found to have lesion in LAD and high grade stenosis of the RCA and Circ. Underwent successful stent of LAD. And IAB placement. He was weaned off pressors. IABP dc'd am 6/20. Progressively more confused over course of day (had received Ambien that evening) 6/20 w/ increased WOB and agitation. PCCM consulted that afternoon for agitated delirium.    Pertinent  Medical History  HTN and HL Had not seen MD in 10-15 yrs.  Significant Hospital Events: Including procedures, antibiotic start and stop dates in addition to other pertinent events   6/19 admitted w/ STEMI. While waiting had VF arrest req'd ACLS w/ defib, amio epi; estimated 5 minutes to ROSC. Was awake and alert after ROSC obtained. Went to cath lab emergently. Found to have lesion in LAD and high grade stenosis of the RCA and Circ. Underwent successful stent of LAD. And IAB placement 6/20 off IABP. Pressors off. Progressive agitation and WOB. Intubated PCCM asked to consult    Echo 09/07/2020 Left ventricular ejection fraction, by estimation, is 20 to 25%. The left ventricle has severely decreased function. The left ventricle demonstrates regional wall motion abnormalities (see scoring diagram/findings for description). Left ventricular diastolic parameters were normal. There is akinesis of the left ventricular, entire anterior wall, anterolateral wall and apical segment. Right ventricular systolic function is normal. The right ventricular size is normal. There is mildly  elevated pulmonary artery systolic pressure. The estimated right ventricular systolic pressure is 33.9 mmHg. Left atrial size was mildly dilated. The mitral valve is normal in structure. No evidence of mitral valve regurgitation. No evidence of mitral stenosis.  The aortic valve is normal in structure. Aortic valve regurgitation is not visualized. No aortic stenosis is present. Conclusion(s)/Recommendation(s): Findings consistent with ischemic cardiomyopathy.  09/09/2020 awake and alert attempt to wean from ventilator  Interim History / Subjective:  Opens his eyes and follows commands. Remains on fentanyl, heparin, milrinone currently off vasopressor support FiO2 to around 40% Decrease fentanyl sedation Noted to be -1400  24-hour Wean from ventilator as tolerated    Objective   Blood pressure (!) 89/64, pulse (!) 112, temperature (!) 101.9 F (38.8 C), temperature source Oral, resp. rate 18, height 5\' 9"  (1.753 m), weight 90.7 kg, SpO2 96 %. CVP:  [5 mmHg-16 mmHg] 16 mmHg  Vent Mode: PRVC FiO2 (%):  [50 %] 50 % Set Rate:  [18 bmp] 18 bmp Vt Set:  [560 mL] 560 mL PEEP:  [8 cmH20] 8 cmH20 Plateau Pressure:  [18 cmH20-19 cmH20] 18 cmH20   Intake/Output Summary (Last 24 hours) at 09/09/2020 09/11/2020 Last data filed at 09/09/2020 0900 Gross per 24 hour  Intake 1914.01 ml  Output 3385 ml  Net -1470.99 ml   Filed Weights   September 25, 2020 1552  Weight: 90.7 kg    Examination: General: Disheveled male who arouses and follows commands HEENT: MM pink/moist, endotracheal tube is in place gastric tube is in place with tube feedings running Neuro: With strong stimulation he follows commands moves all extremities with some jerky movements  CV: Heart sounds are regular and distant GI: soft, bsx4 active tube feeding is at goal GU: Amber urine Extremities: warm/dry, 1+ edema  Skin: no rashes or lesions   Labs/imaging that I havepersonally reviewed  (right click and "Reselect all SmartList  Selections" daily)  09/09/2020 chest x-ray with lines and tubes in stable position appears wet indicative of congestive heart failure Arterial blood gas pH 7.45 PCO2 37 PO2 of 8450% FiO2 Glucose is 131, creatinine 1.28, ALT 80  Resolved Hospital Problem list     Assessment & Plan:  Acute metabolic encephalopathy. Suspect that this is multi-factorial: drug induced Remus Loffler) but also wonder could this be occult shock or hypoxia related.  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 EF 20-25% - Echo 6/21 EF  looks about 10% Plan Per cardiology Diuresis as tolerated Continue milrinone Co. ox as needed Off Levophed    AKI Creatinine up to 1.36 ( 1.29 on 6/20) Lab Results  Component Value Date   CREATININE 1.28 (H) 09/09/2020   CREATININE 1.36 (H) 09/08/2020   CREATININE 1.29 (H) 09/07/2020    Plan Monitor creatinine is stable at this time Continue diuresis  Hyperglycemia CBG (last 3)  Recent Labs    09/09/20 0012 09/09/20 0407 09/09/20 0831  GLUCAP 129* 128* 166*    Plan Sliding-scale insulin coverage  Abnormal  LFTs Plan Trend LFTs  Nutrition Plan Currently on tube May be extubating for the next 24 to 48 hours  Elevated WBC and low grade fever ? reactive Plan Monitor Monitor culture data   Best Practice (right click and "Reselect all SmartList Selections" daily)   Diet/type:Tube feeds Pain/Anxiety/Delirium protocol RASS goal -1 VAP protocol (if indicated): Yes DVT prophylaxis: prophylactic heparin  GI  prophylaxis: PPI Glucose control:  SSI Central venous access:  Yes, and it is still needed Arterial line:  Yes, and it is still needed Foley:  N/A Mobility:  bed rest  PT consulted: N/A Studies pending: None Culture data pending:none Last reviewed culture data:today Antibiotics:not indicated  Antibiotic de-escalation: no,  continue current rx Stop date: N/A Daily labs: not indicated Code Status:  full code Last date of multidisciplinary goals of care discussion [pending ] ccm prognosis: Life-threating Disposition: remains critically ill, will stay in intensive care        Labs   CBC: Recent Labs  Lab 09/03/2020 1551 09/07/20 0137 09/07/20 1533 09/07/20 1814 09/08/20 0418 09/08/20 0430 09/09/20 0233 09/09/20 0511  WBC 7.3 10.0  --   --  14.5*  --  13.0*  --   NEUTROABS 3.3  --   --   --   --   --   --   --   HGB 13.8 12.9*   < > 12.9* 13.0 12.6* 12.0* 11.2*  HCT 38.3* 36.6*   < > 38.0* 38.5* 37.0* 35.9* 33.0*  MCV 95.3 91.0  --   --  91.4  --  92.1  --   PLT 361 324  --   --  329  --  257  --    < > =  values in this interval not displayed.    Basic Metabolic Panel: Recent Labs  Lab 08/19/2020 1551 09/07/20 0137 09/07/20 1533 09/07/20 1818 09/08/20 0418 09/08/20 0430 09/09/20 0233 09/09/20 0511  NA 138 139   < > 139 140 140 138 138  K 3.2* 3.9   < > 3.8 3.6 3.7 3.5 3.6  CL 107 109  --  110 107  --  106  --   CO2 22 20*  --  21* 21*  --  27  --   GLUCOSE 127* 156*  --  175* 156*  --  131*  --   BUN 13 15  --  14 16  --  23  --   CREATININE 1.21 1.26*  --  1.29* 1.36*  --  1.28*  --   CALCIUM 8.2* 8.0*  --  8.1* 8.0*  --  8.1*  --   MG 2.2  --   --  1.9 2.0  --  2.1  --    < > = values in this interval not displayed.   GFR: Estimated Creatinine Clearance: 61.5 mL/min (A) (by C-G formula based on SCr of 1.28 mg/dL (H)). Recent Labs  Lab 09/07/2020 1551 09/07/20 0137 09/07/20 1814 09/07/20 2236 09/08/20 0154 09/08/20 0418 09/09/20 0233  WBC 7.3 10.0   --   --   --  14.5* 13.0*  LATICACIDVEN  --   --  3.4* 4.3* 3.4*  --  1.3    Liver Function Tests: Recent Labs  Lab 08/23/2020 1551 09/07/20 0137 09/08/20 0418 09/09/20 0233  AST 26 683* 314* 161*  ALT 18 143* 111* 80*  ALKPHOS 97 85 77 86  BILITOT 0.6 0.9 1.1 1.0  PROT 6.3* 5.7* 5.7* 5.4*  ALBUMIN 3.5 3.1* 2.8* 2.6*   No results for input(s): LIPASE, AMYLASE in the last 168 hours. No results for input(s): AMMONIA in the last 168 hours.  ABG    Component Value Date/Time   PHART 7.454 (H) 09/09/2020 0511   PCO2ART 37.0 09/09/2020 0511   PO2ART 84 09/09/2020 0511   HCO3 25.7 09/09/2020 0511   TCO2 27 09/09/2020 0511   ACIDBASEDEF 2.0 09/08/2020 0430   O2SAT 96.0 09/09/2020 0511     Coagulation Profile: Recent Labs  Lab 09/14/2020 1551 09/07/20 1818  INR 1.1 1.2    Cardiac Enzymes: Recent Labs  Lab 09/09/20 0233  CKTOTAL 880*  CKMB 19.6*    HbA1C: Hgb A1c MFr Bld  Date/Time Value Ref Range Status  09/07/2020 06:14 PM 5.3 4.8 - 5.6 % Final    Comment:    (NOTE)         Prediabetes: 5.7 - 6.4         Diabetes: >6.4         Glycemic control for adults with diabetes: <7.0   09/14/2020 06:59 PM 5.5 4.8 - 5.6 % Final    Comment:    (NOTE)         Prediabetes: 5.7 - 6.4         Diabetes: >6.4         Glycemic control for adults with diabetes: <7.0     CBG: Recent Labs  Lab 09/08/20 1555 09/08/20 1954 09/09/20 0012 09/09/20 0407 09/09/20 0831  GLUCAP 122* 122* 129* 128* 166*     Critical care time: 30 minutes.     Brett Canales Albie Bazin ACNP Acute Care Nurse Practitioner [facility] Please consult Amion 09/09/2020, 9:58 AM

## 2020-09-09 NOTE — Progress Notes (Signed)
K+ 3.5  Replaced per protocol  

## 2020-09-09 NOTE — Progress Notes (Signed)
Patient found at 1400 to have self-extubated. Agarwala MD at bedside. BiPAP placed.

## 2020-09-09 NOTE — Progress Notes (Signed)
RT NOTE: RT called to room by RN as patient had self extubated. CCM at bedside and ordered bipap. RT placed patient on bipap . Patient tolerating well at this time. Vitals are stable. RT will continue to monitor.

## 2020-09-10 ENCOUNTER — Inpatient Hospital Stay (HOSPITAL_COMMUNITY): Payer: Medicare Other

## 2020-09-10 LAB — CBC WITH DIFFERENTIAL/PLATELET
Abs Immature Granulocytes: 0.08 10*3/uL — ABNORMAL HIGH (ref 0.00–0.07)
Basophils Absolute: 0 10*3/uL (ref 0.0–0.1)
Basophils Relative: 0 %
Eosinophils Absolute: 0 10*3/uL (ref 0.0–0.5)
Eosinophils Relative: 0 %
HCT: 34.3 % — ABNORMAL LOW (ref 39.0–52.0)
Hemoglobin: 11.5 g/dL — ABNORMAL LOW (ref 13.0–17.0)
Immature Granulocytes: 1 %
Lymphocytes Relative: 7 %
Lymphs Abs: 0.9 10*3/uL (ref 0.7–4.0)
MCH: 30.7 pg (ref 26.0–34.0)
MCHC: 33.5 g/dL (ref 30.0–36.0)
MCV: 91.7 fL (ref 80.0–100.0)
Monocytes Absolute: 1.1 10*3/uL — ABNORMAL HIGH (ref 0.1–1.0)
Monocytes Relative: 9 %
Neutro Abs: 10.3 10*3/uL — ABNORMAL HIGH (ref 1.7–7.7)
Neutrophils Relative %: 83 %
Platelets: 260 10*3/uL (ref 150–400)
RBC: 3.74 MIL/uL — ABNORMAL LOW (ref 4.22–5.81)
RDW: 13.9 % (ref 11.5–15.5)
WBC: 12.4 10*3/uL — ABNORMAL HIGH (ref 4.0–10.5)
nRBC: 0 % (ref 0.0–0.2)

## 2020-09-10 LAB — PHOSPHORUS: Phosphorus: 2.8 mg/dL (ref 2.5–4.6)

## 2020-09-10 LAB — URINALYSIS, ROUTINE W REFLEX MICROSCOPIC
Bacteria, UA: NONE SEEN
Bilirubin Urine: NEGATIVE
Glucose, UA: NEGATIVE mg/dL
Ketones, ur: NEGATIVE mg/dL
Leukocytes,Ua: NEGATIVE
Nitrite: NEGATIVE
Protein, ur: NEGATIVE mg/dL
Specific Gravity, Urine: 1.006 (ref 1.005–1.030)
pH: 6 (ref 5.0–8.0)

## 2020-09-10 LAB — POCT I-STAT 7, (LYTES, BLD GAS, ICA,H+H)
Acid-Base Excess: 8 mmol/L — ABNORMAL HIGH (ref 0.0–2.0)
Bicarbonate: 30.7 mmol/L — ABNORMAL HIGH (ref 20.0–28.0)
Calcium, Ion: 1.08 mmol/L — ABNORMAL LOW (ref 1.15–1.40)
HCT: 39 % (ref 39.0–52.0)
Hemoglobin: 13.3 g/dL (ref 13.0–17.0)
O2 Saturation: 98 %
Patient temperature: 98.6
Potassium: 3.4 mmol/L — ABNORMAL LOW (ref 3.5–5.1)
Sodium: 140 mmol/L (ref 135–145)
TCO2: 32 mmol/L (ref 22–32)
pCO2 arterial: 37.3 mmHg (ref 32.0–48.0)
pH, Arterial: 7.524 — ABNORMAL HIGH (ref 7.350–7.450)
pO2, Arterial: 100 mmHg (ref 83.0–108.0)

## 2020-09-10 LAB — HEPATIC FUNCTION PANEL
ALT: 60 U/L — ABNORMAL HIGH (ref 0–44)
AST: 96 U/L — ABNORMAL HIGH (ref 15–41)
Albumin: 2.6 g/dL — ABNORMAL LOW (ref 3.5–5.0)
Alkaline Phosphatase: 85 U/L (ref 38–126)
Bilirubin, Direct: 0.2 mg/dL (ref 0.0–0.2)
Indirect Bilirubin: 0.9 mg/dL (ref 0.3–0.9)
Total Bilirubin: 1.1 mg/dL (ref 0.3–1.2)
Total Protein: 5.9 g/dL — ABNORMAL LOW (ref 6.5–8.1)

## 2020-09-10 LAB — GLUCOSE, CAPILLARY
Glucose-Capillary: 117 mg/dL — ABNORMAL HIGH (ref 70–99)
Glucose-Capillary: 127 mg/dL — ABNORMAL HIGH (ref 70–99)
Glucose-Capillary: 108 mg/dL — ABNORMAL HIGH (ref 70–99)
Glucose-Capillary: 117 mg/dL — ABNORMAL HIGH (ref 70–99)
Glucose-Capillary: 120 mg/dL — ABNORMAL HIGH (ref 70–99)

## 2020-09-10 LAB — BASIC METABOLIC PANEL
Anion gap: 8 (ref 5–15)
BUN: 26 mg/dL — ABNORMAL HIGH (ref 8–23)
CO2: 30 mmol/L (ref 22–32)
Calcium: 8 mg/dL — ABNORMAL LOW (ref 8.9–10.3)
Chloride: 101 mmol/L (ref 98–111)
Creatinine, Ser: 1.33 mg/dL — ABNORMAL HIGH (ref 0.61–1.24)
GFR, Estimated: 58 mL/min — ABNORMAL LOW (ref >60)
Glucose, Bld: 131 mg/dL — ABNORMAL HIGH (ref 70–99)
Potassium: 3.5 mmol/L (ref 3.5–5.1)
Sodium: 139 mmol/L (ref 135–145)

## 2020-09-10 LAB — MAGNESIUM: Magnesium: 2.1 mg/dL (ref 1.7–2.4)

## 2020-09-10 LAB — PROCALCITONIN: Procalcitonin: 0.32 ng/mL

## 2020-09-10 LAB — COOXEMETRY PANEL
Carboxyhemoglobin: 1 % (ref 0.5–1.5)
Methemoglobin: 0.9 % (ref 0.0–1.5)
O2 Saturation: 63.4 %
Total hemoglobin: 12 g/dL (ref 12.0–16.0)

## 2020-09-10 LAB — LACTIC ACID, PLASMA: Lactic Acid, Venous: 1.4 mmol/L (ref 0.5–1.9)

## 2020-09-10 LAB — BRAIN NATRIURETIC PEPTIDE: B Natriuretic Peptide: 1570.9 pg/mL — ABNORMAL HIGH (ref 0.0–100.0)

## 2020-09-10 MED ORDER — QUETIAPINE FUMARATE 25 MG PO TABS
25.0000 mg | ORAL_TABLET | Freq: Every day | ORAL | Status: DC
  Administered 2020-09-10: 25 mg via ORAL
  Filled 2020-09-10: qty 1

## 2020-09-10 MED ORDER — DOCUSATE SODIUM 50 MG/5ML PO LIQD
100.0000 mg | Freq: Two times a day (BID) | ORAL | Status: DC
  Administered 2020-09-10 – 2020-09-11 (×3): 100 mg via ORAL
  Filled 2020-09-10 (×3): qty 10

## 2020-09-10 MED ORDER — IVABRADINE HCL 7.5 MG PO TABS
7.5000 mg | ORAL_TABLET | Freq: Two times a day (BID) | ORAL | Status: DC
  Administered 2020-09-10 – 2020-09-11 (×3): 7.5 mg via ORAL
  Filled 2020-09-10 (×6): qty 1

## 2020-09-10 MED ORDER — ADULT MULTIVITAMIN W/MINERALS CH
1.0000 | ORAL_TABLET | Freq: Every day | ORAL | Status: DC
  Administered 2020-09-10 – 2020-09-11 (×2): 1 via ORAL
  Filled 2020-09-10 (×2): qty 1

## 2020-09-10 MED ORDER — ATORVASTATIN CALCIUM 80 MG PO TABS
80.0000 mg | ORAL_TABLET | Freq: Every day | ORAL | Status: DC
  Administered 2020-09-10 – 2020-09-11 (×2): 80 mg via ORAL
  Filled 2020-09-10 (×2): qty 1

## 2020-09-10 MED ORDER — POTASSIUM CHLORIDE CRYS ER 20 MEQ PO TBCR
60.0000 meq | EXTENDED_RELEASE_TABLET | Freq: Once | ORAL | Status: AC
  Administered 2020-09-10: 60 meq via ORAL
  Filled 2020-09-10: qty 3

## 2020-09-10 MED ORDER — TICAGRELOR 90 MG PO TABS
90.0000 mg | ORAL_TABLET | Freq: Two times a day (BID) | ORAL | Status: DC
  Administered 2020-09-10 – 2020-09-11 (×3): 90 mg via ORAL
  Filled 2020-09-10 (×3): qty 1

## 2020-09-10 MED ORDER — ALBUMIN HUMAN 25 % IV SOLN
12.5000 g | Freq: Four times a day (QID) | INTRAVENOUS | Status: AC
  Administered 2020-09-10 (×3): 12.5 g via INTRAVENOUS
  Filled 2020-09-10 (×3): qty 50

## 2020-09-10 MED ORDER — ORAL CARE MOUTH RINSE
15.0000 mL | Freq: Two times a day (BID) | OROMUCOSAL | Status: DC
  Administered 2020-09-10 – 2020-09-12 (×4): 15 mL via OROMUCOSAL

## 2020-09-10 MED ORDER — QUETIAPINE FUMARATE 25 MG PO TABS: 25.0000 mg | ORAL_TABLET | Freq: Every day | ORAL | Status: DC

## 2020-09-10 MED ORDER — ASPIRIN EC 81 MG PO TBEC
81.0000 mg | DELAYED_RELEASE_TABLET | Freq: Every day | ORAL | Status: DC
  Administered 2020-09-10 – 2020-09-11 (×2): 81 mg via ORAL
  Filled 2020-09-10 (×2): qty 1

## 2020-09-10 NOTE — Progress Notes (Addendum)
NAME:  Jesse Lowe, MRN:  [id number], DOB:  1950/08/21, LOS: 4 ADMISSION DATE:  Sep 13, 2020, CONSULTATION DATE:  6/20 REFERRING MD:  Jacinto Halim, CHIEF COMPLAINT:  delirium and respiratory distress    History of Present Illness:  70 yr old male who was admitted 6/19 w/ cc: severe crushing CP. Code STEMI activated. While waiting had VF arrest req'd ACLS w/ defib, amio epi; estimated 5 minutes to ROSC. Was awake and alert after ROSC obtained. Went to cath lab emergently. Found to have lesion in LAD and high grade stenosis of the RCA and Circ. Underwent successful stent of LAD. And IAB placement. He was weaned off pressors. IABP dc'd am 6/20. Progressively more confused over course of day (had received Ambien that evening) 6/20 w/ increased WOB and agitation. PCCM consulted that afternoon for agitated delirium.   Pertinent  Medical History  HTN and HL Had not seen MD in 10-15 yrs.  Significant Hospital Events: Including procedures, antibiotic start and stop dates in addition to other pertinent events   6/19 admitted w/ STEMI. While waiting had VF arrest req'd ACLS w/ defib, amio epi; estimated 5 minutes to ROSC. Was awake and alert after ROSC obtained. Went to cath lab emergently. Found to have lesion in LAD and high grade stenosis of the RCA and Circ. Underwent successful stent of LAD. And IAB placement 6/20 off IABP. Pressors off. Progressive agitation and WOB. Intubated PCCM asked to consult 6/22 Extubated    Echo 09/07/2020 > Left ventricular ejection fraction, by estimation, is 20 to 25%. The left ventricle has severely decreased function. The left ventricle demonstrates regional wall motion abnormalities (see scoring diagram/findings for description). Left ventricular diastolic parameters were normal. There is akinesis of the left ventricular, entire anterior wall, anterolateral wall and apical Segment, mildly elevated pulmonary artery systolic pressure. The estimated right ventricular systolic  pressure is 33.9 mmHg. Left atrial size was mildly dilated.  Interim History / Subjective:  Extubated 6/22 to BiPAP, no further events overnight. Remains on low dose milrinone and levophed   Objective   Blood pressure 97/71, pulse 86, temperature 98.2 F (36.8 C), temperature source Axillary, resp. rate 20, height 5\' 9"  (1.753 m), weight 90.4 kg, SpO2 97 %. CVP:  [7 mmHg-12 mmHg] 12 mmHg  Vent Mode: BIPAP FiO2 (%):  [36 %-40 %] 36 % Set Rate:  [12 bmp-18 bmp] 12 bmp Vt Set:  [560 mL] 560 mL PEEP:  [5 cmH20-8 cmH20] 5 cmH20 Plateau Pressure:  [18 cmH20] 18 cmH20   Intake/Output Summary (Last 24 hours) at 09/10/2020 0804 Last data filed at 09/10/2020 0400 Gross per 24 hour  Intake 525.53 ml  Output 2465 ml  Net -1939.47 ml   Filed Weights   09/13/20 1552 09/10/20 0600  Weight: 90.7 kg 90.4 kg    Examination: General: adult male, lying in bed  HEENT: Dry MM  Neuro: alert, slow to respond > answers questions appropriately, follows commands  CV: RRR, no MRG GI: soft, active bowel sounds  GU: foley in place  Extremities: warm/dry Skin: no rashes or lesions  Labs/imaging that I havepersonally reviewed  (right click and "Reselect all SmartList Selections" daily)  CXR 6/23 > Cardiomegaly, mild bilateral interstitial prominence, improved from prior examination, findings suggest mild residual interstitial edema, tiny right pleural effusion cannot be excluded   Resolved Hospital Problem list     Assessment & Plan:   Acute metabolic encephalopathy. Suspect that this is multi-factorial: drug induced (ambien), ETOH, but also wonder could this be occult  shock or hypoxia related. 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  CVP 11 > 6/22, have asked RN to obtain this AM  Co-0x 63 -echo w/ severe LV dysfxn. EF 20-25% -Echo 6/21 EF looks about 10% Plan Per cardiology Diuresis (Currently on 80 mg lasix BID), add albumin x 3 doses   Continue milrinone Daily CO-ox  Titrate Levophed for MAP goal >65  AKI in setting of ATN secondary to shock state Plan Trend BMP Replace electrolytes as indicated  Continue diuresis   Hyperglycemia -Hemoglobin AIC 5.3 Plan Trend Glucose, SSI   Abnormal LFTs, improving, suspect secondary to ischemia/hypoxia  Plan Trend LFTs  Nutrition Plan Bedside swallow evaluation, if fails will obtain speech therapy consult   Elevated WBC and low grade fever > Improving, suspect reactive  Plan Trend WBC and Fever Curve Obtain Cultures   Best Practice (right click and "Reselect all SmartList Selections" daily)   Diet/type:Tube feeds Pain/Anxiety/Delirium protocol Not indicated and RASS goal -1 VAP protocol (if indicated): Not indicated DVT prophylaxis: prophylactic heparin  GI prophylaxis: PPI Glucose control:  SSI Central venous access:  Yes, and it is still needed Arterial line:  N/A and Yes, and it is still needed Foley:  N/A and Yes, and it is still needed Mobility:  bed rest  PT consulted: N/A Studies pending: None Culture data pending:urine  and blood Last reviewed culture data:today Antibiotics:not indicated  Antibiotic de-escalation: no,  continue current rx Stop date: N/A Daily labs: ordered Code Status:  full code Last date of multidisciplinary goals of care discussion [pending ] ccm prognosis: Life-threating Disposition: remains critically ill, will stay in intensive care  Labs   CBC: Recent Labs  Lab 09/08/2020 1551 09/07/20 0137 09/07/20 1533 09/08/20 0418 09/08/20 0430 09/09/20 0233  09/09/20 0511 09/10/20 0400 09/10/20 0529  WBC 7.3 10.0  --  14.5*  --  13.0*  --  12.4*  --   NEUTROABS 3.3  --   --   --   --   --   --  10.3*  --   HGB 13.8 12.9*   < > 13.0 12.6* 12.0* 11.2* 11.5* 13.3  HCT 38.3* 36.6*   < > 38.5* 37.0* 35.9* 33.0* 34.3* 39.0  MCV 95.3 91.0  --  91.4  --  92.1  --  91.7  --   PLT 361 324  --  329  --  257  --  260  --    < > = values in this interval not displayed.    Basic Metabolic Panel: Recent Labs  Lab 09/12/2020 1551 09/07/20 0137 09/07/20 1533 09/07/20 1818 09/08/20 0418 09/08/20 0430 09/09/20 0233 09/09/20 0511 09/10/20 0400 09/10/20 0529  NA 138 139   < > 139 140 140 138 138 139 140  K 3.2* 3.9   < > 3.8 3.6 3.7 3.5 3.6 3.5 3.4*  CL 107 109  --  110 107  --  106  --  101  --   CO2 22 20*  --  21* 21*  --  27  --  30  --  GLUCOSE 127* 156*  --  175* 156*  --  131*  --  131*  --   BUN 13 15  --  14 16  --  23  --  26*  --   CREATININE 1.21 1.26*  --  1.29* 1.36*  --  1.28*  --  1.33*  --   CALCIUM 8.2* 8.0*  --  8.1* 8.0*  --  8.1*  --  8.0*  --   MG 2.2  --   --  1.9 2.0  --  2.1  --  2.1  --   PHOS  --   --   --   --   --   --   --   --  2.8  --    < > = values in this interval not displayed.   GFR: Estimated Creatinine Clearance: 59.1 mL/min (A) (by C-G formula based on SCr of 1.33 mg/dL (H)). Recent Labs  Lab 09/07/20 0137 09/07/20 1814 09/07/20 2236 09/08/20 0154 09/08/20 0418 09/09/20 0233 09/10/20 0400  WBC 10.0  --   --   --  14.5* 13.0* 12.4*  LATICACIDVEN  --  3.4* 4.3* 3.4*  --  1.3  --     Liver Function Tests: Recent Labs  Lab 09/14/2020 1551 09/07/20 0137 09/08/20 0418 09/09/20 0233 09/10/20 0400  AST 26 683* 314* 161* 96*  ALT 18 143* 111* 80* 60*  ALKPHOS 97 85 77 86 85  BILITOT 0.6 0.9 1.1 1.0 1.1  PROT 6.3* 5.7* 5.7* 5.4* 5.9*  ALBUMIN 3.5 3.1* 2.8* 2.6* 2.6*   No results for input(s): LIPASE, AMYLASE in the last 168 hours. No results for input(s): AMMONIA in the last 168 hours.  ABG     Component Value Date/Time   PHART 7.524 (H) 09/10/2020 0529   PCO2ART 37.3 09/10/2020 0529   PO2ART 100 09/10/2020 0529   HCO3 30.7 (H) 09/10/2020 0529   TCO2 32 09/10/2020 0529   ACIDBASEDEF 2.0 09/08/2020 0430   O2SAT 98.0 09/10/2020 0529     Coagulation Profile: Recent Labs  Lab September 14, 2020 1551 09/07/20 1818  INR 1.1 1.2    Cardiac Enzymes: Recent Labs  Lab 09/09/20 0233  CKTOTAL 880*  CKMB 19.6*    HbA1C: Hgb A1c MFr Bld  Date/Time Value Ref Range Status  09/07/2020 06:14 PM 5.3 4.8 - 5.6 % Final    Comment:    (NOTE)         Prediabetes: 5.7 - 6.4         Diabetes: >6.4         Glycemic control for adults with diabetes: <7.0   14-Sep-2020 06:59 PM 5.5 4.8 - 5.6 % Final    Comment:    (NOTE)         Prediabetes: 5.7 - 6.4         Diabetes: >6.4         Glycemic control for adults with diabetes: <7.0     CBG: Recent Labs  Lab 09/09/20 1109 09/09/20 1706 09/09/20 1930 09/09/20 2341 09/10/20 0340  GLUCAP 131* 128* 119* 142* 117*     Critical care time: 32 minutes.     Jovita Kussmaul, AGACNP-BC [facility]  PCCM Pgr: [phone]

## 2020-09-10 NOTE — Progress Notes (Signed)
Subjective:   Patient is extubated, doing well and still appears dazed and states that he does not remember events.  Cooperative.  Intake/Output from previous day:  I/O last 3 completed shifts: In: 1862.1 [I.V.:844.6; NG/GT:1017.5] Out: 4365 [Urine:4365] Total I/O In: 104.6 [I.V.:65.4; IV Piggyback:39.3] Out: -   Blood pressure 102/64, pulse 85, temperature 97.8 F (36.6 C), resp. rate (!) 28, height _0  (1.753 m), weight 90.4 kg, SpO2 99 %.  Physical Exam Vitals reviewed.  Constitutional:      General: He is not in acute distress.    Appearance: Normal appearance.  HENT:     Head: Normocephalic and atraumatic.  Cardiovascular:     Rate and Rhythm: Normal rate and regular rhythm.     Pulses: Normal pulses and intact distal pulses.     Heart sounds: S1 normal and S2 normal. No murmur heard.   No gallop.  Pulmonary:     Breath sounds: No wheezing or rales.     Comments: Patient intubated and sedated. Abdominal:     General: Bowel sounds are normal. There is no distension.  Musculoskeletal:     Right lower leg: No edema.     Left lower leg: No edema.  Skin:    General: Skin is warm.     Capillary Refill: Capillary refill takes less than 2 seconds.  Neurological:     Mental Status: He is alert.    Lab Results: BMP BNP (last 3 results) Recent Labs    09/07/20 0137 09/09/20 0233 09/10/20 0400  BNP 192.7* 1,335.2* 1,570.9*    ProBNP (last 3 results) No results for input(s): PROBNP in the last 8760 hours. BMP Latest Ref Rng & Units 09/10/2020 09/10/2020 09/09/2020  Glucose 70 - 99 mg/dL - 131(H) -  BUN 8 - 23 mg/dL - 26(H) -  Creatinine 0.61 - 1.24 mg/dL - 1.33(H) -  Sodium 135 - 145 mmol/L 140 139 138  Potassium 3.5 - 5.1 mmol/L 3.4(L) 3.5 3.6  Chloride 98 - 111 mmol/L - 101 -  CO2 22 - 32 mmol/L - 30 -  Calcium 8.9 - 10.3 mg/dL - 8.0(L) -   Hepatic Function Latest Ref Rng & Units 09/10/2020 09/09/2020 09/08/2020  Total Protein 6.5 - 8.1 g/dL 5.9(L) 5.4(L)  5.7(L)  Albumin 3.5 - 5.0 g/dL 2.6(L) 2.6(L) 2.8(L)  AST 15 - 41 U/L 96(H) 161(H) 314(H)  ALT 0 - 44 U/L 60(H) 80(H) 111(H)  Alk Phosphatase 38 - 126 U/L 85 86 77  Total Bilirubin 0.3 - 1.2 mg/dL 1.1 1.0 1.1  Bilirubin, Direct 0.0 - 0.2 mg/dL 0.2 - -   CBC Latest Ref Rng & Units 09/10/2020 09/10/2020 09/09/2020  WBC 4.0 - 10.5 K/uL - 12.4(H) -  Hemoglobin 13.0 - 17.0 g/dL 13.3 11.5(L) 11.2(L)  Hematocrit 39.0 - 52.0 % 39.0 34.3(L) 33.0(L)  Platelets 150 - 400 K/uL - 260 -   Lipid Panel     Component Value Date/Time   CHOL 182 08/31/2020 1551   TRIG 225 (H) 09/07/2020 1551   HDL 31 (L) 08/29/2020 1551   CHOLHDL 5.9 09/13/2020 1551   VLDL 45 (H) 09/05/2020 1551   LDLCALC 106 (H) 08/27/2020 1551   Cardiac Panel (last 3 results) Recent Labs    09/09/20 0233  CKTOTAL 880*  CKMB 19.6*  RELINDX 2.2    HEMOGLOBIN A1C Lab Results  Component Value Date   HGBA1C 5.3 09/07/2020   MPG 105 09/07/2020   TSH Recent Labs    08/26/2020 1859  TSH  2.235   Imaging: Portable chest x-ray 09/09/2020:  Central lines and tubes in stable position.  Cardiomegaly again noted.  Bilateral pulmonary infiltrates/edema again noted.  Findings suggestive of CHF with bilateral pleural effusion.  DG Chest X-ray 08/28/2020:  Cardiac shadow is within normal limits. Prior coronary stenting is seen. Intra-aortic balloon pump is noted just below the aortic knob. Lungs are well aerated bilaterally with mild interstitial edema.  IMPRESSION: Mild interstitial edema. Balloon pump in satisfactory position.  Cardiac Studies: Left heart catheterization 09/04/2020: LV 56/15, EDP 21 mmHg.  Aorta 75/44, mean 57 mmHg.  No pressure gradient across the aortic valve.  Markedly elevated EDP. LM: Large vessel, bifurcates into circumflex and LAD. LAD: Has ulcerated subtotally occluded proximal LAD stenosis that extends to the mid segment.  There is TIMI I flow also in the D1 and D2.  Moderate-sized D1 and small sized D2.   Mild disease in the mid to distal LAD. CX: Moderate sized vessel, gives origin to a large OM1, OM 2 is very small, there is a lesion after OM 2 at 80% focal lesion.  OM 3 is moderate sized with secondary branches. RCA: Dominant.  Proximal segment 90% stenosis, tandem 30 to 40% stenosis in the midsegment followed by a high-grade 90% stenosis.  Mild disease in the distal right coronary.   Intervention: Successful 2 overlapping stent implantation with 3.0 x 24 mm followed by 3.0 x 16 mm Synergy XD stents, stenosis reduced from 99% to 0%, TIMI I improved to TIMI-3 flow.   Recommendation: Patient will need relook at the LAD stent in view of severe spasm and cardiogenic shock and need for pressor support in the cardiac catheterization lab.  Patient was started on Levophed due to low blood pressure and intra-aortic balloon pump was inserted percutaneously prior to angioplasty.  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  115 mL contrast utilized.  Echocardiogram 09/07/2020:   1. Left ventricular ejection fraction, by estimation, is 20 to 25%. The left ventricle has severely decreased function. The left ventricle demonstrates regional wall motion abnormalities (see scoring diagram/findings for description). Left ventricular diastolic parameters were normal. There is akinesis of the left ventricular, entire anterior wall, anterolateral wall and apical segment.   2. Right ventricular systolic function is normal. The right ventricular size is normal. There is mildly elevated pulmonary artery systolic pressure. The estimated right ventricular systolic pressure is 94.1 mmHg.   3. Left atrial size was mildly dilated.   4. The mitral valve is normal in structure. No evidence of mitral valve regurgitation. No evidence of mitral stenosis.   5. The aortic valve is normal in structure. Aortic valve  regurgitation is not visualized. No aortic stenosis is present.  EKG:   EKG 09/10/2020: Normal sinus rhythm at rate of 83 bpm, normal axis, poor R wave progression, cannot exclude anteroseptal infarct old.  Nonspecific ST-T abnormality.  Compared to 09/15/2020, minimal anterolateral ST elevation not present.  08/24/2020: 1557 hrs.: Ventricular fibrillation. 09/13/2020 at 1550 hrs.: Anteroseptal and high lateral STEMI.   Telemetry: Sinus rhythm with runs of nonsustained ventricular tachycardia 6-8 beats  Scheduled Meds:  aspirin EC  81 mg Oral Daily   atorvastatin  80 mg Oral Daily   chlorhexidine gluconate (MEDLINE KIT)  15 mL Mouth Rinse BID   Chlorhexidine Gluconate Cloth  6 each Topical Daily   docusate  100 mg Oral BID  enoxaparin (LOVENOX) injection  40 mg Subcutaneous G25W   folic acid  1 mg Intravenous Daily   furosemide  80 mg Intravenous BID   insulin aspart  0-15 Units Subcutaneous Q4H   ivabradine  7.5 mg Oral BID WC   mouth rinse  15 mL Mouth Rinse BID   pantoprazole (PROTONIX) IV  40 mg Intravenous Daily   polyethylene glycol  17 g Per Tube Daily   QUEtiapine  25 mg Oral Daily   sodium chloride flush  3 mL Intravenous Q12H   thiamine injection  100 mg Intravenous Daily   ticagrelor  90 mg Oral BID   Continuous Infusions:  sodium chloride Stopped (09/07/20 0119)   sodium chloride Stopped (09/07/20 1513)   albumin human Stopped (09/10/20 0925)   milrinone 0.125 mcg/kg/min (09/10/20 1000)   norepinephrine (LEVOPHED) Adult infusion 1 mcg/min (09/10/20 1000)   PRN Meds:.acetaminophen (TYLENOL) oral liquid 160 mg/5 mL, nitroGLYCERIN, ondansetron (ZOFRAN) IV, sodium chloride flush  Assessment/Plan:   1.  Acute anterior and lateral STEMI, ST elevation MI. 2.  V. fib arrest - 09/09/2020.  No further significant ventricular arrhythmias on telemetry. 3.  Cardiogenic shock no much improved clinically, BNP still elevated, coox 64.  4.  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   Acute renal failure secondary to ATN from cardiogenic shock. Stable   Recommendation: Patient empirically started on Corlanor.  Not on guideline directed medical therapy as he is still on Primacor low-dose.  We will continue gentle diuresis today, blood pressure still soft, we can attempt low-dose of carvedilol once inotropes are out of the system for 24 hours.  Fortunately tachycardia has improved and EKG looks much improved.  He is diuresing well and renal function has remained stable as well.  Discussed with pulmonary critical care.    Critical care time 25 minutes in evaluation of his medications, hemodynamic parameters and management of acute decompensated heart failure.   Adrian Prows, MD, [facility] 09/10/2020, 1:14 PM Office: [phone] Fax: [phone] Pager: [phone]

## 2020-09-10 NOTE — Progress Notes (Signed)
Patient A&Ox4 but continues to frequently try to get out of bed and has been pulling at lines and his monitors. Patient stated that he was "trying to go take a shower". Education given about why that is not allowed in ICU.

## 2020-09-10 NOTE — Progress Notes (Signed)
Arterial blood gas drawn while patient was wearing oxygen set at 4lpm.

## 2020-09-11 ENCOUNTER — Inpatient Hospital Stay (HOSPITAL_COMMUNITY): Payer: Medicare Other

## 2020-09-11 DIAGNOSIS — J9601 Acute respiratory failure with hypoxia: Secondary | ICD-10-CM

## 2020-09-11 DIAGNOSIS — I213 ST elevation (STEMI) myocardial infarction of unspecified site: Secondary | ICD-10-CM

## 2020-09-11 LAB — BASIC METABOLIC PANEL
Anion gap: 16 — ABNORMAL HIGH (ref 5–15)
BUN: 35 mg/dL — ABNORMAL HIGH (ref 8–23)
CO2: 26 mmol/L (ref 22–32)
Calcium: 8.3 mg/dL — ABNORMAL LOW (ref 8.9–10.3)
Chloride: 97 mmol/L — ABNORMAL LOW (ref 98–111)
Creatinine, Ser: 1.59 mg/dL — ABNORMAL HIGH (ref 0.61–1.24)
GFR, Estimated: 47 mL/min — ABNORMAL LOW (ref >60)
Glucose, Bld: 137 mg/dL — ABNORMAL HIGH (ref 70–99)
Potassium: 3.2 mmol/L — ABNORMAL LOW (ref 3.5–5.1)
Sodium: 139 mmol/L (ref 135–145)

## 2020-09-11 LAB — POCT I-STAT 7, (LYTES, BLD GAS, ICA,H+H)
Acid-Base Excess: 6 mmol/L — ABNORMAL HIGH (ref 0.0–2.0)
Acid-Base Excess: 8 mmol/L — ABNORMAL HIGH (ref 0.0–2.0)
Bicarbonate: 29.4 mmol/L — ABNORMAL HIGH (ref 20.0–28.0)
Bicarbonate: 30.7 mmol/L — ABNORMAL HIGH (ref 20.0–28.0)
Calcium, Ion: 1.07 mmol/L — ABNORMAL LOW (ref 1.15–1.40)
Calcium, Ion: 1.08 mmol/L — ABNORMAL LOW (ref 1.15–1.40)
HCT: 32 % — ABNORMAL LOW (ref 39.0–52.0)
HCT: 33 % — ABNORMAL LOW (ref 39.0–52.0)
Hemoglobin: 10.9 g/dL — ABNORMAL LOW (ref 13.0–17.0)
Hemoglobin: 11.2 g/dL — ABNORMAL LOW (ref 13.0–17.0)
O2 Saturation: 100 %
O2 Saturation: 100 %
Patient temperature: 37.2
Patient temperature: 37.8
Potassium: 3.2 mmol/L — ABNORMAL LOW (ref 3.5–5.1)
Potassium: 3.8 mmol/L (ref 3.5–5.1)
Sodium: 141 mmol/L (ref 135–145)
Sodium: 141 mmol/L (ref 135–145)
TCO2: 31 mmol/L (ref 22–32)
TCO2: 32 mmol/L (ref 22–32)
pCO2 arterial: 37.5 mmHg (ref 32.0–48.0)
pCO2 arterial: 39.7 mmHg (ref 32.0–48.0)
pH, Arterial: 7.479 — ABNORMAL HIGH (ref 7.350–7.450)
pH, Arterial: 7.524 — ABNORMAL HIGH (ref 7.350–7.450)
pO2, Arterial: 213 mmHg — ABNORMAL HIGH (ref 83.0–108.0)
pO2, Arterial: 289 mmHg — ABNORMAL HIGH (ref 83.0–108.0)

## 2020-09-11 LAB — COOXEMETRY PANEL
Carboxyhemoglobin: 0.9 % (ref 0.5–1.5)
Carboxyhemoglobin: 0.9 % (ref 0.5–1.5)
Carboxyhemoglobin: 0.8 % (ref 0.5–1.5)
Carboxyhemoglobin: 0.7 % (ref 0.5–1.5)
Methemoglobin: 0.9 % (ref 0.0–1.5)
Methemoglobin: 0.8 % (ref 0.0–1.5)
Methemoglobin: 0.9 % (ref 0.0–1.5)
Methemoglobin: 0.9 % (ref 0.0–1.5)
O2 Saturation: 55.6 %
O2 Saturation: 82.1 %
O2 Saturation: 54.9 %
O2 Saturation: 73.2 %
Total hemoglobin: 11.9 g/dL — ABNORMAL LOW (ref 12.0–16.0)
Total hemoglobin: 11.3 g/dL — ABNORMAL LOW (ref 12.0–16.0)
Total hemoglobin: 12.1 g/dL (ref 12.0–16.0)
Total hemoglobin: 11.8 g/dL — ABNORMAL LOW (ref 12.0–16.0)

## 2020-09-11 LAB — GLUCOSE, CAPILLARY
Glucose-Capillary: 120 mg/dL — ABNORMAL HIGH (ref 70–99)
Glucose-Capillary: 129 mg/dL — ABNORMAL HIGH (ref 70–99)
Glucose-Capillary: 129 mg/dL — ABNORMAL HIGH (ref 70–99)
Glucose-Capillary: 138 mg/dL — ABNORMAL HIGH (ref 70–99)
Glucose-Capillary: 141 mg/dL — ABNORMAL HIGH (ref 70–99)
Glucose-Capillary: 150 mg/dL — ABNORMAL HIGH (ref 70–99)

## 2020-09-11 LAB — MAGNESIUM: Magnesium: 2.5 mg/dL — ABNORMAL HIGH (ref 1.7–2.4)

## 2020-09-11 LAB — ECHOCARDIOGRAM LIMITED
AR max vel: 3.22 cm2
AV Area VTI: 3.05 cm2
AV Area mean vel: 3.07 cm2
AV Mean grad: 2 mmHg
AV Peak grad: 3.3 mmHg
Ao pk vel: 0.9 m/s
Area-P 1/2: 3.5 cm2
Height: 69 in
Weight: 2970.04 oz

## 2020-09-11 LAB — CBC
HCT: 32.3 % — ABNORMAL LOW (ref 39.0–52.0)
Hemoglobin: 11.4 g/dL — ABNORMAL LOW (ref 13.0–17.0)
MCH: 32.7 pg (ref 26.0–34.0)
MCHC: 35.3 g/dL (ref 30.0–36.0)
MCV: 92.6 fL (ref 80.0–100.0)
Platelets: 300 10*3/uL (ref 150–400)
RBC: 3.49 MIL/uL — ABNORMAL LOW (ref 4.22–5.81)
RDW: 13.7 % (ref 11.5–15.5)
WBC: 10.7 10*3/uL — ABNORMAL HIGH (ref 4.0–10.5)
nRBC: 0 % (ref 0.0–0.2)

## 2020-09-11 LAB — PROCALCITONIN: Procalcitonin: 0.35 ng/mL

## 2020-09-11 LAB — HEPARIN LEVEL (UNFRACTIONATED): Heparin Unfractionated: 0.17 IU/mL — ABNORMAL LOW (ref 0.30–0.70)

## 2020-09-11 LAB — BRAIN NATRIURETIC PEPTIDE: B Natriuretic Peptide: 2312.1 pg/mL — ABNORMAL HIGH (ref 0.0–100.0)

## 2020-09-11 LAB — PHOSPHORUS: Phosphorus: 4.3 mg/dL (ref 2.5–4.6)

## 2020-09-11 LAB — LACTIC ACID, PLASMA: Lactic Acid, Venous: 2.1 mmol/L (ref 0.5–1.9)

## 2020-09-11 MED ORDER — POTASSIUM CHLORIDE 20 MEQ PO PACK
40.0000 meq | PACK | Freq: Two times a day (BID) | ORAL | Status: AC
  Administered 2020-09-12: 40 meq
  Filled 2020-09-11: qty 2

## 2020-09-11 MED ORDER — MELATONIN 3 MG PO TABS
3.0000 mg | ORAL_TABLET | Freq: Once | ORAL | Status: AC
  Administered 2020-09-11: 3 mg via ORAL
  Filled 2020-09-11: qty 1

## 2020-09-11 MED ORDER — LORAZEPAM 1 MG PO TABS: 2.0000 mg | ORAL_TABLET | Freq: Four times a day (QID) | ORAL | Status: DC | PRN

## 2020-09-11 MED ORDER — ACETAMINOPHEN 500 MG PO TABS: 500.0000 mg | ORAL_TABLET | Freq: Four times a day (QID) | ORAL | Status: DC | PRN

## 2020-09-11 MED ORDER — MILRINONE LACTATE IN DEXTROSE 20-5 MG/100ML-% IV SOLN
0.1250 ug/kg/min | INTRAVENOUS | Status: DC
  Administered 2020-09-11 – 2020-09-17 (×9): 0.25 ug/kg/min via INTRAVENOUS
  Administered 2020-09-19: 0.125 ug/kg/min via INTRAVENOUS
  Filled 2020-09-11 (×10): qty 100

## 2020-09-11 MED ORDER — HEPARIN (PORCINE) 25000 UT/250ML-% IV SOLN
1500.0000 [IU]/h | INTRAVENOUS | Status: DC
  Administered 2020-09-11 – 2020-09-12 (×3): 1500 [IU]/h via INTRAVENOUS
  Filled 2020-09-11 (×3): qty 250

## 2020-09-11 MED ORDER — LORAZEPAM 2 MG/ML IJ SOLN: 2.0000 mg | Freq: Four times a day (QID) | INTRAMUSCULAR | Status: DC | PRN

## 2020-09-11 MED ORDER — MIDAZOLAM HCL 2 MG/2ML IJ SOLN
INTRAMUSCULAR | Status: AC
  Administered 2020-09-11: 2 mg
  Filled 2020-09-11: qty 2

## 2020-09-11 MED ORDER — NOREPINEPHRINE 16 MG/250ML-% IV SOLN
0.0000 ug/min | INTRAVENOUS | Status: DC
  Administered 2020-09-11: 10 ug/min via INTRAVENOUS
  Administered 2020-09-17: 20 ug/min via INTRAVENOUS
  Administered 2020-09-18: 13 ug/min via INTRAVENOUS
  Filled 2020-09-11 (×5): qty 250

## 2020-09-11 MED ORDER — FENTANYL CITRATE (PF) 100 MCG/2ML IJ SOLN
INTRAMUSCULAR | Status: AC
  Administered 2020-09-11: 100 ug via INTRAVENOUS
  Filled 2020-09-11: qty 2

## 2020-09-11 MED ORDER — MIDAZOLAM HCL 2 MG/2ML IJ SOLN: 2.0000 mg | Freq: Once | INTRAMUSCULAR | Status: AC

## 2020-09-11 MED ORDER — NOREPINEPHRINE 4 MG/250ML-% IV SOLN
INTRAVENOUS | Status: AC
  Administered 2020-09-11: 8 ug/min via INTRAVENOUS
  Filled 2020-09-11: qty 250

## 2020-09-11 MED ORDER — DOPAMINE-DEXTROSE 3.2-5 MG/ML-% IV SOLN
2.5000 ug/kg/min | INTRAVENOUS | Status: DC
  Administered 2020-09-11: 2.5 ug/kg/min via INTRAVENOUS
  Filled 2020-09-11: qty 250

## 2020-09-11 MED ORDER — QUETIAPINE FUMARATE 25 MG PO TABS
25.0000 mg | ORAL_TABLET | Freq: Every day | ORAL | Status: DC
  Administered 2020-09-11 – 2020-09-12 (×2): 25 mg
  Filled 2020-09-11 (×2): qty 1

## 2020-09-11 MED ORDER — FUROSEMIDE 10 MG/ML IJ SOLN
8.0000 mg/h | INTRAVENOUS | Status: DC
  Administered 2020-09-11 – 2020-09-14 (×4): 8 mg/h via INTRAVENOUS
  Filled 2020-09-11 (×4): qty 20

## 2020-09-11 MED ORDER — LORAZEPAM 2 MG/ML IJ SOLN
INTRAMUSCULAR | Status: AC
  Administered 2020-09-11: 2 mg via INTRAVENOUS
  Filled 2020-09-11: qty 1

## 2020-09-11 MED ORDER — FOLIC ACID 1 MG PO TABS: 1.0000 mg | ORAL_TABLET | Freq: Every day | ORAL | Status: DC

## 2020-09-11 MED ORDER — IVABRADINE HCL 7.5 MG PO TABS
7.5000 mg | ORAL_TABLET | Freq: Two times a day (BID) | ORAL | Status: DC
  Administered 2020-09-12 – 2020-09-19 (×15): 7.5 mg
  Filled 2020-09-11 (×16): qty 1

## 2020-09-11 MED ORDER — POLYETHYLENE GLYCOL 3350 17 G PO PACK
17.0000 g | PACK | Freq: Every day | ORAL | Status: DC
  Administered 2020-09-12 – 2020-09-18 (×7): 17 g
  Filled 2020-09-11 (×8): qty 1

## 2020-09-11 MED ORDER — PANTOPRAZOLE SODIUM 40 MG IV SOLR: 40.0000 mg | Freq: Every day | INTRAVENOUS | Status: DC

## 2020-09-11 MED ORDER — FENTANYL CITRATE (PF) 100 MCG/2ML IJ SOLN: 25.0000 ug | Freq: Once | INTRAMUSCULAR | Status: DC

## 2020-09-11 MED ORDER — FUROSEMIDE 10 MG/ML IJ SOLN
80.0000 mg | Freq: Once | INTRAMUSCULAR | Status: AC
  Administered 2020-09-11: 80 mg via INTRAVENOUS
  Filled 2020-09-11: qty 8

## 2020-09-11 MED ORDER — CHLORHEXIDINE GLUCONATE 0.12% ORAL RINSE (MEDLINE KIT)
15.0000 mL | Freq: Two times a day (BID) | OROMUCOSAL | Status: DC
  Administered 2020-09-11 – 2020-09-22 (×21): 15 mL via OROMUCOSAL

## 2020-09-11 MED ORDER — ATORVASTATIN CALCIUM 80 MG PO TABS
80.0000 mg | ORAL_TABLET | Freq: Every day | ORAL | Status: DC
  Administered 2020-09-12 – 2020-09-22 (×11): 80 mg
  Filled 2020-09-11 (×11): qty 1

## 2020-09-11 MED ORDER — TICAGRELOR 90 MG PO TABS
90.0000 mg | ORAL_TABLET | Freq: Two times a day (BID) | ORAL | Status: DC
  Administered 2020-09-11 – 2020-09-22 (×22): 90 mg
  Filled 2020-09-11 (×22): qty 1

## 2020-09-11 MED ORDER — MIDAZOLAM HCL 2 MG/2ML IJ SOLN
1.0000 mg | INTRAMUSCULAR | Status: DC | PRN
Start: 2020-09-11 — End: 2020-09-19
  Administered 2020-09-13 (×2): 1 mg via INTRAVENOUS
  Filled 2020-09-11 (×4): qty 2

## 2020-09-11 MED ORDER — ENSURE ENLIVE PO LIQD: 237.0000 mL | Freq: Two times a day (BID) | ORAL | Status: DC

## 2020-09-11 MED ORDER — ASPIRIN 81 MG PO CHEW
81.0000 mg | CHEWABLE_TABLET | Freq: Every day | ORAL | Status: DC
  Administered 2020-09-12 – 2020-09-22 (×11): 81 mg
  Filled 2020-09-11 (×11): qty 1

## 2020-09-11 MED ORDER — FUROSEMIDE 10 MG/ML IJ SOLN
INTRAMUSCULAR | Status: AC
  Filled 2020-09-11: qty 4

## 2020-09-11 MED ORDER — POTASSIUM CHLORIDE 20 MEQ PO PACK
40.0000 meq | PACK | Freq: Two times a day (BID) | ORAL | Status: DC
  Administered 2020-09-11 (×2): 40 meq via ORAL
  Filled 2020-09-11 (×2): qty 2

## 2020-09-11 MED ORDER — ACETAMINOPHEN 500 MG PO TABS
500.0000 mg | ORAL_TABLET | Freq: Four times a day (QID) | ORAL | Status: AC | PRN
  Administered 2020-09-11 – 2020-09-15 (×6): 500 mg
  Filled 2020-09-11 (×6): qty 1

## 2020-09-11 MED ORDER — FENTANYL BOLUS VIA INFUSION
25.0000 ug | INTRAVENOUS | Status: DC | PRN
Start: 2020-09-11 — End: 2020-09-19
  Administered 2020-09-13: 100 ug via INTRAVENOUS
  Administered 2020-09-14: 50 ug via INTRAVENOUS
  Administered 2020-09-14 (×2): 100 ug via INTRAVENOUS
  Administered 2020-09-14: 50 ug via INTRAVENOUS
  Administered 2020-09-15 (×2): 100 ug via INTRAVENOUS
  Administered 2020-09-16: 50 ug via INTRAVENOUS
  Administered 2020-09-16 – 2020-09-17 (×2): 100 ug via INTRAVENOUS
  Administered 2020-09-18: 25 ug via INTRAVENOUS
  Administered 2020-09-18: 50 ug via INTRAVENOUS
  Filled 2020-09-11: qty 100

## 2020-09-11 MED ORDER — FOLIC ACID 1 MG PO TABS
1.0000 mg | ORAL_TABLET | Freq: Every day | ORAL | Status: DC
  Administered 2020-09-12 – 2020-09-22 (×11): 1 mg
  Filled 2020-09-11 (×11): qty 1

## 2020-09-11 MED ORDER — ALBUTEROL SULFATE (2.5 MG/3ML) 0.083% IN NEBU
2.5000 mg | INHALATION_SOLUTION | RESPIRATORY_TRACT | Status: DC | PRN
  Administered 2020-09-11 – 2020-09-16 (×2): 2.5 mg via RESPIRATORY_TRACT
  Filled 2020-09-11 (×2): qty 3

## 2020-09-11 MED ORDER — ORAL CARE MOUTH RINSE
15.0000 mL | OROMUCOSAL | Status: DC
  Administered 2020-09-11 – 2020-09-22 (×107): 15 mL via OROMUCOSAL

## 2020-09-11 MED ORDER — POLYETHYLENE GLYCOL 3350 17 G PO PACK: 17.0000 g | PACK | Freq: Every day | ORAL | Status: DC

## 2020-09-11 MED ORDER — FENTANYL 2500MCG IN NS 250ML (10MCG/ML) PREMIX INFUSION
25.0000 ug/h | INTRAVENOUS | Status: DC
  Administered 2020-09-11: 50 ug/h via INTRAVENOUS
  Administered 2020-09-12: 150 ug/h via INTRAVENOUS
  Administered 2020-09-13: 200 ug/h via INTRAVENOUS
  Administered 2020-09-13: 100 ug/h via INTRAVENOUS
  Administered 2020-09-14 (×2): 200 ug/h via INTRAVENOUS
  Administered 2020-09-15 – 2020-09-16 (×2): 125 ug/h via INTRAVENOUS
  Administered 2020-09-17: 50 ug/h via INTRAVENOUS
  Administered 2020-09-18: 150 ug/h via INTRAVENOUS
  Filled 2020-09-11 (×11): qty 250

## 2020-09-11 MED ORDER — MIDAZOLAM HCL 2 MG/2ML IJ SOLN
1.0000 mg | INTRAMUSCULAR | Status: DC | PRN
  Administered 2020-09-12 – 2020-09-20 (×3): 1 mg via INTRAVENOUS
  Filled 2020-09-11 (×4): qty 2

## 2020-09-11 MED ORDER — ETOMIDATE 2 MG/ML IV SOLN: 20.0000 mg | Freq: Once | INTRAVENOUS | Status: AC

## 2020-09-11 MED ORDER — PERFLUTREN LIPID MICROSPHERE
1.0000 mL | INTRAVENOUS | Status: AC | PRN
  Administered 2020-09-11: 4 mL via INTRAVENOUS
  Filled 2020-09-11: qty 10

## 2020-09-11 MED ORDER — POTASSIUM CHLORIDE 10 MEQ/50ML IV SOLN
10.0000 meq | INTRAVENOUS | Status: AC
  Administered 2020-09-11 – 2020-09-12 (×4): 10 meq via INTRAVENOUS
  Filled 2020-09-11: qty 50

## 2020-09-11 MED ORDER — FENTANYL CITRATE (PF) 100 MCG/2ML IJ SOLN: 100.0000 ug | Freq: Once | INTRAMUSCULAR | Status: AC

## 2020-09-11 MED ORDER — ROCURONIUM BROMIDE 50 MG/5ML IV SOLN
50.0000 mg | Freq: Once | INTRAVENOUS | Status: AC
  Filled 2020-09-11: qty 5

## 2020-09-11 MED ORDER — MORPHINE SULFATE (PF) 2 MG/ML IV SOLN
1.0000 mg | Freq: Once | INTRAVENOUS | Status: AC
  Administered 2020-09-11: 1 mg via INTRAVENOUS
  Filled 2020-09-11: qty 1

## 2020-09-11 MED ORDER — DOCUSATE SODIUM 50 MG/5ML PO LIQD
100.0000 mg | Freq: Two times a day (BID) | ORAL | Status: DC
  Administered 2020-09-11 – 2020-09-20 (×15): 100 mg
  Filled 2020-09-11 (×16): qty 10

## 2020-09-11 MED ORDER — POTASSIUM CHLORIDE 10 MEQ/50ML IV SOLN
10.0000 meq | INTRAVENOUS | Status: DC
Start: 2020-09-11 — End: 2020-09-11
  Administered 2020-09-11: 10 meq via INTRAVENOUS
  Filled 2020-09-11: qty 50

## 2020-09-11 MED ORDER — ADULT MULTIVITAMIN W/MINERALS CH
1.0000 | ORAL_TABLET | Freq: Every day | ORAL | Status: DC
  Administered 2020-09-12 – 2020-09-22 (×11): 1
  Filled 2020-09-11 (×11): qty 1

## 2020-09-11 MED ORDER — PANTOPRAZOLE SODIUM 40 MG IV SOLR
40.0000 mg | Freq: Every day | INTRAVENOUS | Status: DC
  Administered 2020-09-12 – 2020-09-18 (×7): 40 mg via INTRAVENOUS
  Filled 2020-09-11 (×9): qty 40

## 2020-09-11 MED ORDER — ETOMIDATE 2 MG/ML IV SOLN
INTRAVENOUS | Status: AC
  Administered 2020-09-11: 20 mg
  Filled 2020-09-11: qty 20

## 2020-09-11 MED ORDER — ROCURONIUM BROMIDE 10 MG/ML (PF) SYRINGE
PREFILLED_SYRINGE | INTRAVENOUS | Status: AC
  Administered 2020-09-11: 100 mg
  Filled 2020-09-11: qty 10

## 2020-09-11 NOTE — Progress Notes (Signed)
eLink Physician-Brief Progress Note Patient Name: Jesse Lowe DOB: [birth year] MRN: [id number]   Date of Service  09/11/2020  HPI/Events of Note  Portable CXR looks wet with decreased volume R lung.   eICU Interventions  Plan: Lasix 80 mg IV X 1 now. Restart BiPAP.     Intervention Category Major Interventions: Other:  Khadar Monger Dennard Nip 09/11/2020, 1:35 AM

## 2020-09-11 NOTE — Progress Notes (Signed)
  Echocardiogram 2D Echocardiogram has been performed.  Gerda Diss 09/11/2020, 2:08 PM

## 2020-09-11 NOTE — Progress Notes (Signed)
RT NOTE: RT and CCM at bedside to assess patient this AM. Patient placed on 6L salter Hallam to trial off bipap. Pt tolerating well at this time. Vitals are stable and sats are 100%. Bipap is on standby for if needed. RT will continue to monitor.

## 2020-09-11 NOTE — Plan of Care (Signed)
Pt has remained agitated, impulsive, and restless throughout shift. Has made frequent attempts to remove equipment, lines, and has made several attempts to exit bed. Pt denies pain but does endorse intermittent shortness of breath and is now on BiPap. Staff continues to mitigate delirium through frequent re-orientation, distraction, and encouraging healthy sleep pattern. Pt safety measures include bed alarm, lowest bed position, pt safety mitts, and close proximity to nursing station.   Problem: Clinical Measurements: Goal: Will remain free from infection Outcome: Progressing   Problem: Elimination: Goal: Will not experience complications related to bowel motility Outcome: Progressing Goal: Will not experience complications related to urinary retention Outcome: Progressing   Problem: Safety: Goal: Ability to remain free from injury will improve Outcome: Progressing   Problem: Education: Goal: Knowledge of General Education information will improve Description: Including pain rating scale, medication(s)/side effects and non-pharmacologic comfort measures Outcome: Not Progressing   Problem: Health Behavior/Discharge Planning: Goal: Ability to manage health-related needs will improve Outcome: Not Progressing   Problem: Clinical Measurements: Goal: Diagnostic test results will improve Outcome: Not Progressing Goal: Respiratory complications will improve Outcome: Not Progressing   Problem: Activity: Goal: Risk for activity intolerance will decrease Outcome: Not Progressing   Problem: Nutrition: Goal: Adequate nutrition will be maintained Outcome: Not Progressing   Problem: Coping: Goal: Level of anxiety will decrease Outcome: Not Progressing   Problem: Pain Managment: Goal: General experience of comfort will improve Outcome: Not Progressing

## 2020-09-11 NOTE — Progress Notes (Signed)
  Called emergently to room, severe agitation and hypoxia. On arrival being bagged, encephalopathic, does not follow commands, cold extremities, cyanotic, tachypneic.  Bilateral scattered crackles.  Emergently intubated. When sedation orders given, fentanyl and intermittent Versed. Chest x-ray ABG ordered. Discussed with cardiology, milrinone being's restarted with cooximeter monitoring  Tolerated intubation well hemodynamically, PEEP of 10 required transiently, will keep respiratory rate 22 Started on Levophed instead of dopamine  Additional critical care time 32 minutes, independent of procedure  Charmain Diosdado V. [name] MD

## 2020-09-11 NOTE — Progress Notes (Signed)
eLink Physician-Brief Progress Note Patient Name: Jesse Lowe DOB: 10/11/1950 MRN: [id number]   Date of Service  09/11/2020  HPI/Events of Note  SOB/wheezing  eICU Interventions  Plan: Albuterol 2.5 mg via neb Q 3 hours PRN SOB or wheezing. Portable CXR STAT.     Intervention Category Major Interventions: Other:  Brylei Pedley Dennard Nip 09/11/2020, 1:19 AM

## 2020-09-11 NOTE — Progress Notes (Signed)
Pt unavailable for STAT EEG at this time; getting STAT CT and ultrasound per nurse. Will get later as schedule permits.

## 2020-09-11 NOTE — Progress Notes (Signed)
PT Cancellation Note  Patient Details Name: Joedy Eickhoff MRN: [id number] DOB: 13-Dec-1950   Cancelled Treatment:    Reason Eval/Treat Not Completed: Patient not medically ready (pt significantly confused and not following commands with bil mittens and not currently appropriate to attempt evaluation)   Jaasiel [place] B Kwabena Strutz 09/11/2020, 12:49 PM Merryl Hacker, PT Acute Rehabilitation Services Pager: [phone] Office: [phone]

## 2020-09-11 NOTE — Consult Note (Signed)
[facility] Face-to-Face Psychiatry Consult   Reason for Consult:  Agitation Referring Physician:  Adrian Prows, MD Patient Identification: Jesse Lowe MRN:  [id number] Principal Diagnosis: Acute ST elevation myocardial infarction (STEMI) due to occlusion of left anterior descending (LAD) coronary artery [facility]) Diagnosis:  Principal Problem:   Acute ST elevation myocardial infarction (STEMI) due to occlusion of left anterior descending (LAD) coronary artery ([place]) Active Problems:   Cardiogenic shock (HCC)   Acute ST elevation myocardial infarction (STEMI) of anterolateral wall (HCC)   Acute metabolic encephalopathy   AKI (acute kidney injury) ([place])   Malnutrition of moderate degree   Total Time spent with patient: 20 minutes  Subjective:   Jesse Lowe is a 70 y.o. male patient admitted with STEMI and has been agitated since admission per RN.  Patient has no known PMH has been unable to give any.  HPI: On assessment this a.m. patient is actively trying to get out of the bed.  Nursing staff attempted to reorient patient.  Provider patient where he thinks he is and patient and says that he is at home.  Provider tries to reorient patient that he is in the hospital.  Patient says he has been in the hospital recently for psychiatric care and report he was at [facility]  Of note there is no record at this in EMR.  Patient continues to try to get out of bed and says he is going "upstairs to the bathroom".  Staff eventually able to get patient laying down in his bed.  Past Psychiatric History: Unknown  Risk to Self: Yes Risk to Others: Yes Prior Inpatient Therapy: Unknown Prior Outpatient Therapy: Unknown  Past Medical History: History reviewed. No pertinent past medical history.  Past Surgical History:  Procedure Laterality Date   CORONARY/GRAFT ACUTE MI REVASCULARIZATION N/A 09/14/2020   Procedure: Coronary/Graft Acute MI Revascularization;  Surgeon: Adrian Prows, MD;  Location: [place] [facility];   Service: Cardiovascular;  Laterality: N/A;   IABP INSERTION N/A 09/12/2020   Procedure: IABP Insertion;  Surgeon: Adrian Prows, MD;  Location: [place] [facility];  Service: Cardiovascular;  Laterality: N/A;   LEFT HEART CATH AND CORONARY ANGIOGRAPHY N/A 09/10/2020   Procedure: LEFT HEART CATH AND CORONARY ANGIOGRAPHY;  Surgeon: Adrian Prows, MD;  Location: [place] [facility];  Service: Cardiovascular;  Laterality: N/A;   Family History: History reviewed. No pertinent family history. Family Psychiatric  History: Unknown Social History:  Social History   Substance and Sexual Activity  Alcohol Use Never     Social History   Substance and Sexual Activity  Drug Use Never    Social History   Socioeconomic History   Marital status: Not on file    Spouse name: Not on file   Number of children: Not on file   Years of education: Not on file   Highest education level: Not on file  Occupational History   Not on file  Tobacco Use   Smoking status: Never   Smokeless tobacco: Never  Substance and Sexual Activity   Alcohol use: Never   Drug use: Never   Sexual activity: Not on file  Other Topics Concern   Not on file  Social History Narrative   Not on file   Social Determinants of Health   Financial Resource Strain: Not on file  Food Insecurity: Not on file  Transportation Needs: Not on file  Physical Activity: Not on file  Stress: Not on file  Social Connections: Not on file   Additional Social  History:    Allergies:  No Known Allergies  Labs:  Results for orders placed or performed during the hospital encounter of 08/28/2020 (from the past 48 hour(s))  Glucose, capillary     Status: Abnormal   Collection Time: 09/09/20  5:06 PM  Result Value Ref Range   Glucose-Capillary 128 (H) 70 - 99 mg/dL    Comment: Glucose reference range applies only to samples taken after fasting for at least 8 hours.  Glucose, capillary     Status: Abnormal   Collection Time: 09/09/20  7:30 PM   Result Value Ref Range   Glucose-Capillary 119 (H) 70 - 99 mg/dL    Comment: Glucose reference range applies only to samples taken after fasting for at least 8 hours.   Comment 1 Notify RN    Comment 2 Document in Chart   Glucose, capillary     Status: Abnormal   Collection Time: 09/09/20 11:41 PM  Result Value Ref Range   Glucose-Capillary 142 (H) 70 - 99 mg/dL    Comment: Glucose reference range applies only to samples taken after fasting for at least 8 hours.   Comment 1 Notify RN    Comment 2 Document in Chart   Glucose, capillary     Status: Abnormal   Collection Time: 09/10/20  3:40 AM  Result Value Ref Range   Glucose-Capillary 117 (H) 70 - 99 mg/dL    Comment: Glucose reference range applies only to samples taken after fasting for at least 8 hours.   Comment 1 Notify RN    Comment 2 Document in Chart   .Cooxemetry Panel (carboxy, met, total hgb, O2 sat)     Status: None   Collection Time: 09/10/20  4:00 AM  Result Value Ref Range   Total hemoglobin 12.0 12.0 - 16.0 g/dL   O2 Saturation 63.4 %   Carboxyhemoglobin 1.0 0.5 - 1.5 %   Methemoglobin 0.9 0.0 - 1.5 %    Comment: Performed at [facility] [address], [place], [place] [address]  Basic metabolic panel     Status: Abnormal   Collection Time: 09/10/20  4:00 AM  Result Value Ref Range   Sodium 139 135 - 145 mmol/L   Potassium 3.5 3.5 - 5.1 mmol/L   Chloride 101 98 - 111 mmol/L   CO2 30 22 - 32 mmol/L   Glucose, Bld 131 (H) 70 - 99 mg/dL    Comment: Glucose reference range applies only to samples taken after fasting for at least 8 hours.   BUN 26 (H) 8 - 23 mg/dL   Creatinine, Ser 1.33 (H) 0.61 - 1.24 mg/dL   Calcium 8.0 (L) 8.9 - 10.3 mg/dL   GFR, Estimated 58 (L) >60 mL/min    Comment: (NOTE) Calculated using the CKD-EPI Creatinine Equation (2021)    Anion gap 8 5 - 15    Comment: Performed at [facility] [address], [place] [address]  CBC with Differential/Platelet      Status: Abnormal   Collection Time: 09/10/20  4:00 AM  Result Value Ref Range   WBC 12.4 (H) 4.0 - 10.5 K/uL   RBC 3.74 (L) 4.22 - 5.81 MIL/uL   Hemoglobin 11.5 (L) 13.0 - 17.0 g/dL   HCT 34.3 (L) 39.0 - 52.0 %   MCV 91.7 80.0 - 100.0 fL   MCH 30.7 26.0 - 34.0 pg   MCHC 33.5 30.0 - 36.0 g/dL   RDW 13.9 11.5 - 15.5 %  Platelets 260 150 - 400 K/uL   nRBC 0.0 0.0 - 0.2 %   Neutrophils Relative % 83 %   Neutro Abs 10.3 (H) 1.7 - 7.7 K/uL   Lymphocytes Relative 7 %   Lymphs Abs 0.9 0.7 - 4.0 K/uL   Monocytes Relative 9 %   Monocytes Absolute 1.1 (H) 0.1 - 1.0 K/uL   Eosinophils Relative 0 %   Eosinophils Absolute 0.0 0.0 - 0.5 K/uL   Basophils Relative 0 %   Basophils Absolute 0.0 0.0 - 0.1 K/uL   Immature Granulocytes 1 %   Abs Immature Granulocytes 0.08 (H) 0.00 - 0.07 K/uL    Comment: Performed at [facility] [address], [place], [place] [address]  Magnesium     Status: None   Collection Time: 09/10/20  4:00 AM  Result Value Ref Range   Magnesium 2.1 1.7 - 2.4 mg/dL    Comment: Performed at [facility] [address], [place], [place] [address]  Phosphorus     Status: None   Collection Time: 09/10/20  4:00 AM  Result Value Ref Range   Phosphorus 2.8 2.5 - 4.6 mg/dL    Comment: Performed at [facility] [address] [place], [place] [address]  Hepatic function panel     Status: Abnormal   Collection Time: 09/10/20  4:00 AM  Result Value Ref Range   Total Protein 5.9 (L) 6.5 - 8.1 g/dL   Albumin 2.6 (L) 3.5 - 5.0 g/dL   AST 96 (H) 15 - 41 U/L   ALT 60 (H) 0 - 44 U/L   Alkaline Phosphatase 85 38 - 126 U/L   Total Bilirubin 1.1 0.3 - 1.2 mg/dL   Bilirubin, Direct 0.2 0.0 - 0.2 mg/dL   Indirect Bilirubin 0.9 0.3 - 0.9 mg/dL    Comment: Performed at [facility] [address], [place], [place] [address]  Brain natriuretic peptide     Status: Abnormal   Collection Time: 09/10/20  4:00 AM  Result Value Ref Range   B Natriuretic Peptide  1,570.9 (H) 0.0 - 100.0 pg/mL    Comment: Performed at [facility] [address], [place], [place] [address]  I-STAT 7, (LYTES, BLD GAS, ICA, H+H)     Status: Abnormal   Collection Time: 09/10/20  5:29 AM  Result Value Ref Range   pH, Arterial 7.524 (H) 7.350 - 7.450   pCO2 arterial 37.3 32.0 - 48.0 mmHg   pO2, Arterial 100 83.0 - 108.0 mmHg   Bicarbonate 30.7 (H) 20.0 - 28.0 mmol/L   TCO2 32 22 - 32 mmol/L   O2 Saturation 98.0 %   Acid-Base Excess 8.0 (H) 0.0 - 2.0 mmol/L   Sodium 140 135 - 145 mmol/L   Potassium 3.4 (L) 3.5 - 5.1 mmol/L   Calcium, Ion 1.08 (L) 1.15 - 1.40 mmol/L   HCT 39.0 39.0 - 52.0 %   Hemoglobin 13.3 13.0 - 17.0 g/dL   Patient temperature 98.6 F    Collection site Radial    Drawn by RT    Sample type ARTERIAL   Glucose, capillary     Status: Abnormal   Collection Time: 09/10/20  8:11 AM  Result Value Ref Range   Glucose-Capillary 127 (H) 70 - 99 mg/dL    Comment: Glucose reference range applies only to samples taken after fasting for at least 8 hours.  Procalcitonin - Baseline     Status: None  Collection Time: 09/10/20  8:33 AM  Result Value Ref Range   Procalcitonin 0.32 ng/mL    Comment:        Interpretation: PCT (Procalcitonin) <= 0.5 ng/mL: Systemic infection (sepsis) is not likely. Local bacterial infection is possible. (NOTE)       Sepsis PCT Algorithm           Lower Respiratory Tract                                      Infection PCT Algorithm    ----------------------------     ----------------------------         PCT < 0.25 ng/mL                PCT < 0.10 ng/mL          Strongly encourage             Strongly discourage   discontinuation of antibiotics    initiation of antibiotics    ----------------------------     -----------------------------       PCT 0.25 - 0.50 ng/mL            PCT 0.10 - 0.25 ng/mL               OR       >80% decrease in PCT            Discourage initiation of                                             antibiotics      Encourage discontinuation           of antibiotics    ----------------------------     -----------------------------         PCT >= 0.50 ng/mL              PCT 0.26 - 0.50 ng/mL               AND        <80% decrease in PCT             Encourage initiation of                                             antibiotics       Encourage continuation           of antibiotics    ----------------------------     -----------------------------        PCT >= 0.50 ng/mL                  PCT > 0.50 ng/mL               AND         increase in PCT                  Strongly encourage                                      initiation of antibiotics    Strongly encourage escalation  of antibiotics                                     -----------------------------                                           PCT <= 0.25 ng/mL                                                 OR                                        > 80% decrease in PCT                                      Discontinue / Do not initiate                                             antibiotics  Performed at [facility], [place] [address], [place], [place] [address]   Lactic acid, plasma     Status: None   Collection Time: 09/10/20  8:33 AM  Result Value Ref Range   Lactic Acid, Venous 1.4 0.5 - 1.9 mmol/L    Comment: Performed at [facility] [address], [place], [place] [address]  Urinalysis, Routine w reflex microscopic     Status: Abnormal   Collection Time: 09/10/20  9:53 AM  Result Value Ref Range   Color, Urine STRAW (A) YELLOW   APPearance CLEAR CLEAR   Specific Gravity, Urine 1.006 1.005 - 1.030   pH 6.0 5.0 - 8.0   Glucose, UA NEGATIVE NEGATIVE mg/dL   Hgb urine dipstick SMALL (A) NEGATIVE   Bilirubin Urine NEGATIVE NEGATIVE   Ketones, ur NEGATIVE NEGATIVE mg/dL   Protein, ur NEGATIVE NEGATIVE mg/dL   Nitrite NEGATIVE NEGATIVE   Leukocytes,Ua NEGATIVE NEGATIVE   RBC / HPF 6-10 0 - 5  RBC/hpf   WBC, UA 0-5 0 - 5 WBC/hpf   Bacteria, UA NONE SEEN NONE SEEN   Squamous Epithelial / LPF 0-5 0 - 5   Hyaline Casts, UA PRESENT     Comment: Performed at [facility], [place] [address], [place], [place] [address]  Glucose, capillary     Status: Abnormal   Collection Time: 09/10/20 12:02 PM  Result Value Ref Range   Glucose-Capillary 108 (H) 70 - 99 mg/dL    Comment: Glucose reference range applies only to samples taken after fasting for at least 8 hours.  Glucose, capillary     Status: Abnormal   Collection Time: 09/10/20  3:39 PM  Result Value Ref Range   Glucose-Capillary 117 (H) 70 - 99 mg/dL    Comment: Glucose reference range applies only to samples taken after fasting for at least 8 hours.  Glucose, capillary     Status: Abnormal  Collection Time: 09/10/20  7:55 PM  Result Value Ref Range   Glucose-Capillary 120 (H) 70 - 99 mg/dL    Comment: Glucose reference range applies only to samples taken after fasting for at least 8 hours.  Glucose, capillary     Status: Abnormal   Collection Time: 09/11/20 12:05 AM  Result Value Ref Range   Glucose-Capillary 138 (H) 70 - 99 mg/dL    Comment: Glucose reference range applies only to samples taken after fasting for at least 8 hours.  .Cooxemetry Panel (carboxy, met, total hgb, O2 sat)     Status: Abnormal   Collection Time: 09/11/20  3:20 AM  Result Value Ref Range   Total hemoglobin 11.9 (L) 12.0 - 16.0 g/dL   O2 Saturation 55.6 %   Carboxyhemoglobin 0.9 0.5 - 1.5 %   Methemoglobin 0.9 0.0 - 1.5 %    Comment: Performed at [facility] [address], [place], [place] [address]  Basic metabolic panel     Status: Abnormal   Collection Time: 09/11/20  3:20 AM  Result Value Ref Range   Sodium 139 135 - 145 mmol/L   Potassium 3.2 (L) 3.5 - 5.1 mmol/L   Chloride 97 (L) 98 - 111 mmol/L   CO2 26 22 - 32 mmol/L   Glucose, Bld 137 (H) 70 - 99 mg/dL    Comment: Glucose reference range applies only to samples taken after  fasting for at least 8 hours.   BUN 35 (H) 8 - 23 mg/dL   Creatinine, Ser 1.59 (H) 0.61 - 1.24 mg/dL   Calcium 8.3 (L) 8.9 - 10.3 mg/dL   GFR, Estimated 47 (L) >60 mL/min    Comment: (NOTE) Calculated using the CKD-EPI Creatinine Equation (2021)    Anion gap 16 (H) 5 - 15    Comment: Performed at [facility] [address], [place], [place] [address]  CBC     Status: Abnormal   Collection Time: 09/11/20  3:20 AM  Result Value Ref Range   WBC 10.7 (H) 4.0 - 10.5 K/uL   RBC 3.49 (L) 4.22 - 5.81 MIL/uL   Hemoglobin 11.4 (L) 13.0 - 17.0 g/dL   HCT 32.3 (L) 39.0 - 52.0 %   MCV 92.6 80.0 - 100.0 fL   MCH 32.7 26.0 - 34.0 pg   MCHC 35.3 30.0 - 36.0 g/dL   RDW 13.7 11.5 - 15.5 %   Platelets 300 150 - 400 K/uL   nRBC 0.0 0.0 - 0.2 %    Comment: Performed at [facility], [place] [address] [place], [place] [address]  Magnesium     Status: Abnormal   Collection Time: 09/11/20  3:20 AM  Result Value Ref Range   Magnesium 2.5 (H) 1.7 - 2.4 mg/dL    Comment: Performed at [facility] [address] [place], [place] [address]  Phosphorus     Status: None   Collection Time: 09/11/20  3:20 AM  Result Value Ref Range   Phosphorus 4.3 2.5 - 4.6 mg/dL    Comment: Performed at [facility] [address] [place],  [address]  Procalcitonin     Status: None   Collection Time: 09/11/20  3:20 AM  Result Value Ref Range   Procalcitonin 0.35 ng/mL    Comment:        Interpretation: PCT (Procalcitonin) <= 0.5 ng/mL: Systemic infection (sepsis) is not likely. Local bacterial infection is possible. (NOTE)  Sepsis PCT Algorithm           Lower Respiratory Tract                                      Infection PCT Algorithm    ----------------------------     ----------------------------         PCT < 0.25 ng/mL                PCT < 0.10 ng/mL          Strongly encourage             Strongly discourage   discontinuation of antibiotics    initiation of  antibiotics    ----------------------------     -----------------------------       PCT 0.25 - 0.50 ng/mL            PCT 0.10 - 0.25 ng/mL               OR       >80% decrease in PCT            Discourage initiation of                                            antibiotics      Encourage discontinuation           of antibiotics    ----------------------------     -----------------------------         PCT >= 0.50 ng/mL              PCT 0.26 - 0.50 ng/mL               AND        <80% decrease in PCT             Encourage initiation of                                             antibiotics       Encourage continuation           of antibiotics    ----------------------------     -----------------------------        PCT >= 0.50 ng/mL                  PCT > 0.50 ng/mL               AND         increase in PCT                  Strongly encourage                                      initiation of antibiotics    Strongly encourage escalation           of antibiotics                                     -----------------------------  PCT <= 0.25 ng/mL                                                 OR                                        > 80% decrease in PCT                                      Discontinue / Do not initiate                                             antibiotics  Performed at [facility], [place] [address] [place], [place] [address]   Glucose, capillary     Status: Abnormal   Collection Time: 09/11/20  4:14 AM  Result Value Ref Range   Glucose-Capillary 141 (H) 70 - 99 mg/dL    Comment: Glucose reference range applies only to samples taken after fasting for at least 8 hours.  Glucose, capillary     Status: Abnormal   Collection Time: 09/11/20  6:53 AM  Result Value Ref Range   Glucose-Capillary 129 (H) 70 - 99 mg/dL    Comment: Glucose reference range applies only to samples taken after fasting for at least 8 hours.   .Cooxemetry Panel (carboxy, met, total hgb, O2 sat)     Status: Abnormal   Collection Time: 09/11/20 10:29 AM  Result Value Ref Range   Total hemoglobin 11.3 (L) 12.0 - 16.0 g/dL   O2 Saturation 82.1 %   Carboxyhemoglobin 0.9 0.5 - 1.5 %   Methemoglobin 0.8 0.0 - 1.5 %    Comment: Performed at [facility] [address] [place], [place] [address]  Glucose, capillary     Status: Abnormal   Collection Time: 09/11/20 11:45 AM  Result Value Ref Range   Glucose-Capillary 129 (H) 70 - 99 mg/dL    Comment: Glucose reference range applies only to samples taken after fasting for at least 8 hours.    Current Facility-Administered Medications  Medication Dose Route Frequency Provider Last Rate Last Admin   0.9 %  sodium chloride infusion   Intravenous Continuous Pearson Grippe, DO   Stopped at 09/07/20 0119   0.9 %  sodium chloride infusion   Intravenous Continuous Minor, Grace Bushy, NP   Stopped at 09/07/20 1513   acetaminophen (TYLENOL) 160 MG/5ML solution 650 mg  650 mg Per Tube Q4H PRN Eliezer Bottom T, MD   650 mg at 09/09/20 0844   albuterol (PROVENTIL) (2.5 MG/3ML) 0.083% nebulizer solution 2.5 mg  2.5 mg Nebulization Q3H PRN Anders Simmonds, MD   2.5 mg at 09/11/20 0128   aspirin EC tablet 81 mg  81 mg Oral Daily Einar Grad, RPH   81 mg at 09/11/20 0900   atorvastatin (LIPITOR) tablet 80 mg  80 mg Oral Daily Einar Grad, RPH   80 mg at 09/11/20 0900   Chlorhexidine Gluconate Cloth 2 % PADS 6 each  6 each Topical Daily  Adrian Prows, MD   6 each at 09/11/20 0900   docusate (COLACE) 50 MG/5ML liquid 100 mg  100 mg Oral BID Einar Grad, RPH   100 mg at 09/11/20 0900   enoxaparin (LOVENOX) injection 40 mg  40 mg Subcutaneous Q24H Adrian Prows, MD   40 mg at 09/11/20 1300   feeding supplement (ENSURE ENLIVE / ENSURE PLUS) liquid 237 mL  237 mL Oral BID BM Adrian Prows, MD       [START ON 9/79/8921] folic acid (FOLVITE) tablet 1 mg  1 mg Oral Daily Einar Grad,  RPH       insulin aspart (novoLOG) injection 0-15 Units  0-15 Units Subcutaneous Q4H Erick Colace, NP   2 Units at 09/11/20 1251   ivabradine (CORLANOR) tablet 7.5 mg  7.5 mg Oral BID WC Einar Grad, RPH   7.5 mg at 09/11/20 0855   LORazepam (ATIVAN) injection 2 mg  2 mg Intravenous Q6H PRN Damita Dunnings B, MD   2 mg at 09/11/20 1250   MEDLINE mouth rinse  15 mL Mouth Rinse BID Adrian Prows, MD   15 mL at 09/11/20 1941   multivitamin with minerals tablet 1 tablet  1 tablet Oral Daily Kipp Brood, MD   1 tablet at 09/11/20 0900   nitroGLYCERIN (NITROSTAT) SL tablet 0.4 mg  0.4 mg Sublingual Q5 Min x 3 PRN Pearson Grippe, DO   0.4 mg at 09/07/20 0133   norepinephrine (LEVOPHED) 61m in 2536mpremix infusion  0-40 mcg/min Intravenous Titrated AgKipp BroodMD   Stopped at 09/10/20 1707   ondansetron (ZOFRAN) injection 4 mg  4 mg Intravenous Q6H PRN GaAdrian ProwsMD   4 mg at 09/07/20 0517   [START ON 09/12/2020] polyethylene glycol (MIRALAX / GLYCOLAX) packet 17 g  17 g Oral Daily BiEinar GradRPH       potassium chloride (KLOR-CON) packet 40 mEq  40 mEq Oral BID AgKipp BroodMD   40 mEq at 09/11/20 0900   QUEtiapine (SEROQUEL) tablet 25 mg  25 mg Oral QHS Agarwala, RaEinar GradMD       sodium chloride flush (NS) 0.9 % injection 3 mL  3 mL Intravenous Q12H GaAdrian ProwsMD   3 mL at 09/11/20 0900   sodium chloride flush (NS) 0.9 % injection 3 mL  3 mL Intravenous PRN GaAdrian ProwsMD       ticagrelor (BVision Care Of Mainearoostook LLCtablet 90 mg  90 mg Oral BID BiEinar GradRPH   90 mg at 09/11/20 0900    Musculoskeletal: Strength & Muscle Tone: within normal limits Gait & Station:  Remains in bed on exam Patient leans: N/A            Psychiatric Specialty Exam:  Presentation  General Appearance: Casual  Eye Contact:Minimal  Speech:Slurred  Speech Volume:Increased  Handedness: No data recorded  Mood and Affect  Mood:Irritable; Anxious  Affect:-- (confused)   Thought  Process  Thought Processes:Disorganized  Descriptions of Associations:Circumstantial  Orientation:None (believes he is at home)  oriented to year and city. Thought Content:Delusions  History of Schizophrenia/Schizoaffective disorder:-- (unknown)  Duration of Psychotic Symptoms:N/A  Hallucinations:Hallucinations: Visual Description of Visual Hallucinations: RN reports he has been seeing a dog  Ideas of Reference:Delusions  Suicidal Thoughts:Suicidal Thoughts: No  Homicidal Thoughts:Homicidal Thoughts: No   Sensorium  Memory:Immediate Poor; Recent Poor; Remote Poor  Judgment:Impaired  Insight:None   Executive Functions  Concentration:Poor  Attention Span:Poor  Recall:Poor  Fund of Knowledge:Poor  Language:Fair  Psychomotor Activity  Psychomotor Activity:Psychomotor Activity: Restlessness   Assets  Assets:Resilience   Sleep  Sleep:Sleep: Poor   Physical Exam: Physical Exam HENT:     Head: Normocephalic and atraumatic.  Eyes:     Extraocular Movements: Extraocular movements intact.     Conjunctiva/sclera: Conjunctivae normal.  Cardiovascular:     Rate and Rhythm: Normal rate.  Pulmonary:     Effort: Pulmonary effort is normal.     Breath sounds: Normal breath sounds.  Abdominal:     General: Abdomen is flat.  Musculoskeletal:        General: Normal range of motion.  Skin:    General: Skin is warm and dry.  Neurological:     Mental Status: He is alert. He is disoriented.   Review of Systems  Constitutional:  Negative for chills and fever.  HENT:  Negative for hearing loss.   Eyes:  Negative for blurred vision.  Respiratory:  Negative for cough and wheezing.   Cardiovascular:  Negative for chest pain.  Gastrointestinal:  Negative for abdominal pain.  Neurological:  Negative for dizziness.  Psychiatric/Behavioral:  Positive for hallucinations.   Blood pressure 94/64, pulse 72, temperature (!) 97.5 F (36.4 C), temperature source  Axillary, resp. rate (!) 37, height 5' 9"  (1.753 m), weight 84.2 kg, SpO2 100 %. Body mass index is 27.41 kg/m.  Treatment Plan Summary: Medication management Delirium likely secondary to medical cause Per EMR and nursing staff patient has intermittent periods of orientation.  However when he is disoriented continues to try to get out of bed, pull his nasal cannula, cool his blood pressure cuff, and pull his telemetry.  There is concern for an anoxic brain injury secondary to patient's cardiac arrest.  There is also concern for metabolic encephalopathy as patient's creatinine function has continued to worsen over the course of his stay with a new anion gap today as well as a low potassium as well as low potassium and magnesium.  The patient has not had a good reaction to Seroquel or Haldol and with patient's metabolic instability and recent STEMI will hold his medications. - Start Ativan 2 mg every 6 hours as needed for agitation - Recommend MRI brain/CT head - Recommend medical work-up for worsening kidney function  Disposition:  Per primary team PGY-1 Freida Busman, MD 09/11/2020 1:11 PM

## 2020-09-11 NOTE — Progress Notes (Signed)
Nutrition Follow Up   DOCUMENTATION CODES:   Non-severe (moderate) malnutrition in context of chronic illness  INTERVENTION:   Liberalize diet to REGULAR  No BM x 4 days- regimen in place  Ensure Enlive po BID, each supplement provides 350 kcal and 20 grams of protein MVI daily   NUTRITION DIAGNOSIS:   Moderate Malnutrition related to chronic illness as evidenced by mild muscle depletion, mild fat depletion.  Ongoing  GOAL:   Patient will meet greater than or equal to 90% of their needs  Progressing   MONITOR:   Vent status, TF tolerance, Labs, Weight trends  REASON FOR ASSESSMENT:   Consult, Ventilator Enteral/tube feeding initiation and management  ASSESSMENT:   70 yo admitted with chest pain, Code STEMI activated. While waiting i, pt had VF arrest, taking to cath lab and underwent stent to LAD and IABP, intubated. PMH includes HNT, HLD; pt had not seen MD in 10-15 years  6/19 Admitted with STEMI, VF arrest, Cath Lab with stent to LAD and IABP  6/20 IABP removed 6/22 Self extubated  Diet advanced yesterday. Patient with continued delirium. Unable to provide history. Per RN, patient eats on and off depending on mental state. Recommend liberalizing diet to regular given poor PO intake and malnutrition status. RD to provide supplementation to maximize kcal and protein this admission.   Admission weight: 90.7 kg Current weight: 84.2 kg   UOP: 2230 ml x 24 hrs   Medications: colace, SS novolog, miralax, 40 mEq KCl x2,  Labs: K 3.2 (L) Cr 1.59- up from yesterday  Diet Order:   Diet Order             Diet heart healthy/carb modified Room service appropriate? Yes; Fluid consistency: Thin  Diet effective now                   EDUCATION NEEDS:   Not appropriate for education at this time  Skin:  Skin Assessment: Reviewed RN Assessment  Last BM:  6/20  Height:   Ht Readings from Last 1 Encounters:  09-11-20 5\' 9"  (1.753 m)    Weight:   Wt  Readings from Last 1 Encounters:  09/11/20 84.2 kg     BMI:  Body mass index is 27.41 kg/m.  Estimated Nutritional Needs:   Kcal:  2300-2500 kcal  Protein:  115-130 grams  Fluid:  >/= 2 L/day   09/13/20 MS, RD, LDN, CNSC Clinical Nutrition Pager listed in [facility]

## 2020-09-11 NOTE — Progress Notes (Signed)
Patient has continued to decompensate through out the day today. Dr. Denese Killings paged at 1300 today for patient's worsening in mentation and respiratory status as patient breathing was labored and irregular. RT was also paged to bedside to reassess patient. Morphine and Lasix drip were ordered with improvement in patient breathing. At approximately 1800 Dr. Jacinto Halim came back to unit to check on patient and spoke about patients condition as patient's respiratory status was beginning to decline again with tachypnea and accessory muscle use, also patient had not been making much urine since Lasix drip had been started either and this was discussing with Dr. Jacinto Halim as well. After conversation with Dr. Jacinto Halim decision was made that it would be best for the patient if he were reintubated. Dr. Denese Killings was called and his partner Dr. [name] came to bedside to intubate patient. Intubation was completed without incident.

## 2020-09-11 NOTE — Procedures (Signed)
Patient Name: Jesse Lowe  MRN: [id number]  Epilepsy Attending: Charlsie Quest  Referring Physician/Provider: Dr Lynnell Catalan Date: 09/11/2020 Duration: 23.46 mins  Patient history: 70yo M with ams. EEG to evaluate for seizure  Level of alertness:  lethargic   AEDs during EEG study: None  Technical aspects: This EEG study was done with scalp electrodes positioned according to the 10-20 International system of electrode placement. Electrical activity was acquired at a sampling rate of 500Hz  and reviewed with a high frequency filter of 70Hz  and a low frequency filter of 1Hz . EEG data were recorded continuously and digitally stored.   Description: EEG showed continuous generalized 5 to 7 Hz theta as well as intermittent 2-3hz  delta slowing. Hyperventilation and photic stimulation were not performed.     ABNORMALITY - Continuous slow, generalized  IMPRESSION: This study is suggestive of moderate diffuse encephalopathy, nonspecific etiology. No seizures or epileptiform discharges were seen throughout the recording.  [name] 

## 2020-09-11 NOTE — Progress Notes (Signed)
STAT EEG completed; results pending. Dr Yadav notified. 

## 2020-09-11 NOTE — Procedures (Signed)
Intubation Procedure Note  Jesse Lowe  [id number]  February 08, 1951  Date:09/11/20  Time:6:46 PM   Provider Performing:Vedanshi Massaro V. [name]    Procedure: Intubation (31500)  Indication(s) Respiratory Failure  Consent Unable to obtain consent due to emergent nature of procedure.   Anesthesia Etomidate, Versed, Fentanyl, and Rocuronium   Time Out Verified patient identification, verified procedure, site/side was marked, verified correct patient position, special equipment/implants available, medications/allergies/relevant history reviewed, required imaging and test results available.   Sterile Technique Usual hand hygeine, masks, and gloves were used   Procedure Description Patient positioned in bed supine.  Sedation given as noted above.  Patient was intubated with endotracheal tube using Glidescope.  View was Grade 1 full glottis .  Number of attempts was 1.  Colorimetric CO2 detector was consistent with tracheal placement.   Complications/Tolerance None; patient tolerated the procedure well. Chest X-ray is ordered to verify placement.   EBL Minimal   Specimen(s) None  Jesse Chrystal V. [name] MD

## 2020-09-11 NOTE — Progress Notes (Addendum)
Subjective:   Patient is extubated, events from last night with psychosis, delirium noted.  He needed BiPAP support last night and early this morning.  Presently denies any symptoms but seems alienated.  Answers to simple questions and also appears very appropriate.  Intake/Output from previous day:  I/O last 3 completed shifts: In: 328.5 [I.V.:250.2; IV Piggyback:78.3] Out: 3205 [Urine:3205] Total I/O In: -  Out: 500 [Urine:500]  Blood pressure 93/64, pulse 73, temperature (!) (P) 96.7 F (35.9 C), resp. rate (!) 37, height 5' 9"  (1.753 m), weight 84.2 kg, SpO2 99 %.  Vitals with BMI 09/11/2020 09/11/2020 09/11/2020  Height - - -  Weight - - -  BMI - - -  Systolic 93 - 409  Diastolic 64 - 55  Pulse 73 80 77     Physical Exam Vitals reviewed.  Constitutional:      General: He is not in acute distress.    Appearance: Normal appearance.  HENT:     Head: Normocephalic and atraumatic.  Neck:     Vascular: JVD present. No carotid bruit.  Cardiovascular:     Rate and Rhythm: Normal rate and regular rhythm.     Pulses: Normal pulses and intact distal pulses.     Heart sounds: S1 normal and S2 normal. No murmur heard.   No gallop.  Pulmonary:     Breath sounds: No wheezing or rales (Bibasilar diffuse).  Abdominal:     General: Bowel sounds are normal. There is no distension.  Musculoskeletal:     Right lower leg: No edema.     Left lower leg: No edema.  Skin:    General: Skin is warm.     Capillary Refill: Capillary refill takes less than 2 seconds.  Neurological:     Mental Status: He is alert.    Lab Results: BMP BNP (last 3 results) Recent Labs    09/09/20 0233 09/10/20 0400 09/11/20 0320  BNP 1,335.2* 1,570.9* 2,312.1*    ProBNP (last 3 results) No results for input(s): PROBNP in the last 8760 hours. BMP Latest Ref Rng & Units 09/11/2020 09/10/2020 09/10/2020  Glucose 70 - 99 mg/dL 137(H) - 131(H)  BUN 8 - 23 mg/dL 35(H) - 26(H)  Creatinine 0.61 - 1.24 mg/dL  1.59(H) - 1.33(H)  Sodium 135 - 145 mmol/L 139 140 139  Potassium 3.5 - 5.1 mmol/L 3.2(L) 3.4(L) 3.5  Chloride 98 - 111 mmol/L 97(L) - 101  CO2 22 - 32 mmol/L 26 - 30  Calcium 8.9 - 10.3 mg/dL 8.3(L) - 8.0(L)   Hepatic Function Latest Ref Rng & Units 09/10/2020 09/09/2020 09/08/2020  Total Protein 6.5 - 8.1 g/dL 5.9(L) 5.4(L) 5.7(L)  Albumin 3.5 - 5.0 g/dL 2.6(L) 2.6(L) 2.8(L)  AST 15 - 41 U/L 96(H) 161(H) 314(H)  ALT 0 - 44 U/L 60(H) 80(H) 111(H)  Alk Phosphatase 38 - 126 U/L 85 86 77  Total Bilirubin 0.3 - 1.2 mg/dL 1.1 1.0 1.1  Bilirubin, Direct 0.0 - 0.2 mg/dL 0.2 - -   CBC Latest Ref Rng & Units 09/11/2020 09/10/2020 09/10/2020  WBC 4.0 - 10.5 K/uL 10.7(H) - 12.4(H)  Hemoglobin 13.0 - 17.0 g/dL 11.4(L) 13.3 11.5(L)  Hematocrit 39.0 - 52.0 % 32.3(L) 39.0 34.3(L)  Platelets 150 - 400 K/uL 300 - 260   Lipid Panel     Component Value Date/Time   CHOL 182 09/03/2020 1551   TRIG 225 (H) 09/07/2020 1551   HDL 31 (L) 08/26/2020 1551   CHOLHDL 5.9 08/26/2020 1551  VLDL 45 (H) 08/22/2020 1551   LDLCALC 106 (H) 09/02/2020 1551   Cardiac Panel (last 3 results) Recent Labs    09/09/20 0233  CKTOTAL 880*  CKMB 19.6*  RELINDX 2.2    HEMOGLOBIN A1C Lab Results  Component Value Date   HGBA1C 5.3 09/07/2020   MPG 105 09/07/2020   TSH Recent Labs    09/04/2020 1859  TSH 2.235   BNP (last 3 results) Recent Labs    09/09/20 0233 09/10/20 0400 09/11/20 0320  BNP 1,335.2* 1,570.9* 2,312.1*    ProBNP (last 3 results) No results for input(s): PROBNP in the last 8760 hours.  Imaging: Portable chest x-ray 09/09/2020:  Central lines and tubes in stable position.  Cardiomegaly again noted.  Bilateral pulmonary infiltrates/edema again noted.  Findings suggestive of CHF with bilateral pleural effusion.  DG Chest X-ray 09/03/2020:  Cardiac shadow is within normal limits. Prior coronary stenting is seen. Intra-aortic balloon pump is noted just below the aortic knob. Lungs are well  aerated bilaterally with mild interstitial edema.  IMPRESSION: Mild interstitial edema. Balloon pump in satisfactory position.  Cardiac Studies: Left heart catheterization 09/13/2020: LV 56/15, EDP 21 mmHg.  Aorta 75/44, mean 57 mmHg.  No pressure gradient across the aortic valve.  Markedly elevated EDP. LM: Large vessel, bifurcates into circumflex and LAD. LAD: Has ulcerated subtotally occluded proximal LAD stenosis that extends to the mid segment.  There is TIMI I flow also in the D1 and D2.  Moderate-sized D1 and small sized D2.  Mild disease in the mid to distal LAD. CX: Moderate sized vessel, gives origin to a large OM1, OM 2 is very small, there is a lesion after OM 2 at 80% focal lesion.  OM 3 is moderate sized with secondary branches. RCA: Dominant.  Proximal segment 90% stenosis, tandem 30 to 40% stenosis in the midsegment followed by a high-grade 90% stenosis.  Mild disease in the distal right coronary.   Intervention: Successful 2 overlapping stent implantation with 3.0 x 24 mm followed by 3.0 x 16 mm Synergy XD stents, stenosis reduced from 99% to 0%, TIMI I improved to TIMI-3 flow.   Recommendation: Patient will need relook at the LAD stent in view of severe spasm and cardiogenic shock and need for pressor support in the cardiac catheterization lab.  Patient was started on Levophed due to low blood pressure and intra-aortic balloon pump was inserted percutaneously prior to angioplasty.  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  115 mL contrast utilized.  Echocardiogram 09/07/2020:   1. Left ventricular ejection fraction, by estimation, is 20 to 25%. The left ventricle has severely decreased function. The left ventricle demonstrates regional wall motion abnormalities (see scoring diagram/findings for description). Left ventricular diastolic parameters were normal. There  is akinesis of the left ventricular, entire anterior wall, anterolateral wall and apical segment.   2. Right ventricular systolic function is normal. The right ventricular size is normal. There is mildly elevated pulmonary artery systolic pressure. The estimated right ventricular systolic pressure is 88.9 mmHg.   3. Left atrial size was mildly dilated.   4. The mitral valve is normal in structure. No evidence of mitral valve regurgitation. No evidence of mitral stenosis.   5. The aortic valve is normal in structure. Aortic valve regurgitation is not visualized. No aortic stenosis is present.  Limited echocardiogram 09/11/2020: LVEF 10 to 20%.  Global  hypokinesis, anterior, anterolateral and apical akinesis.  No significant valvular abnormality.  No pericardial effusion.  EKG:   EKG 09/10/2020: Normal sinus rhythm at rate of 83 bpm, normal axis, poor R wave progression, cannot exclude anteroseptal infarct old.  Nonspecific ST-T abnormality.  Compared to 09/15/2020, minimal anterolateral ST elevation not present.  09/10/2020: 1557 hrs.: Ventricular fibrillation. 09/11/2020 at 1550 hrs.: Anteroseptal and high lateral STEMI.   Telemetry: Sinus rhythm with runs of nonsustained ventricular tachycardia 6-8 beats  Scheduled Meds:  aspirin EC  81 mg Oral Daily   atorvastatin  80 mg Oral Daily   Chlorhexidine Gluconate Cloth  6 each Topical Daily   docusate  100 mg Oral BID   feeding supplement  237 mL Oral BID BM   [START ON 5/88/3254] folic acid  1 mg Oral Daily   furosemide       insulin aspart  0-15 Units Subcutaneous Q4H   ivabradine  7.5 mg Oral BID WC   mouth rinse  15 mL Mouth Rinse BID   multivitamin with minerals  1 tablet Oral Daily   [START ON 09/12/2020] polyethylene glycol  17 g Oral Daily   potassium chloride  40 mEq Oral BID   QUEtiapine  25 mg Oral QHS   sodium chloride flush  3 mL Intravenous Q12H   ticagrelor  90 mg Oral BID   Continuous Infusions:  sodium chloride Stopped  (09/07/20 0119)   sodium chloride Stopped (09/07/20 1513)   furosemide (LASIX) 200 mg in dextrose 5% 100 mL (28m/mL) infusion 8 mg/hr (09/11/20 1542)   heparin     norepinephrine (LEVOPHED) Adult infusion Stopped (09/10/20 1707)   PRN Meds:.acetaminophen (TYLENOL) oral liquid 160 mg/5 mL, albuterol, LORazepam, nitroGLYCERIN, ondansetron (ZOFRAN) IV, sodium chloride flush  Assessment/Plan:   1.  Acute anterior and lateral STEMI, ST elevation MI. 2.  Cardiogenic shock not much improvement.   4.  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  Acute renal failure secondary to ATN from cardiogenic shock.    Recommendation:   Patient is still in acute pulmonary edema and condition is precarious.  His LVEF has taken IBgard with the entire anterolateral wall being akinetic apex being akinetic and anterolateral hypokinesis.  We will start the patient on Primacor and titrated up as tolerated, acute renal failure is also part of Cardiorenal syndrome with reduced cardiac output.  If he does not tolerate Primacor and dopamine for support.  If he does not respond and renal function continues to deteriorate, probably best option would be to place circulatory assist with Impella and intubating the patient as he has significant delirium and may not stay still in bed.  Will check serum lactic acid now and again in the morning, BNP serial monitoring. For patient's delirium and confusion, will have psychiatry evaluation to help [place] especially sundown phenomena.  Critical care time 35 minutes   JAdrian Prows MD, FAvera Hand [facility]6/24/2022, 4:59 PM Office: [phone]: [phone]: [phone]

## 2020-09-11 NOTE — Progress Notes (Signed)
Subjective:   Patient getting more lethargic and also much more delirious.  Intake/Output from previous day:  I/O last 3 completed shifts: In: 328.5 [I.V.:250.2; IV Piggyback:78.3] Out: 3205 [Urine:3205] Total I/O In: -  Out: 800 [Urine:800]  Blood pressure 98/66, pulse 77, temperature (!) 96.7 F (35.9 C), temperature source Oral, resp. rate (!) 30, height _0  (1.753 m), weight 84.2 kg, SpO2 99 %.  Vitals with BMI 09/11/2020 09/11/2020 09/11/2020  Height - - -  Weight - - -  BMI - - -  Systolic 98 93 -  Diastolic 66 64 -  Pulse 77 73 80     Physical Exam Vitals reviewed.  Constitutional:      General: He is not in acute distress.    Appearance: Normal appearance.  HENT:     Head: Normocephalic and atraumatic.  Neck:     Vascular: JVD present. No carotid bruit.  Cardiovascular:     Rate and Rhythm: Normal rate and regular rhythm.     Pulses: Normal pulses and intact distal pulses.     Heart sounds: S1 normal and S2 normal. No murmur heard.   No gallop.  Pulmonary:     Effort: Respiratory distress present.     Breath sounds: Rhonchi (Bilateral diffuse and extensive) and rales (Bilateral diffuse and extensive) present. No wheezing.  Abdominal:     General: Bowel sounds are normal. There is no distension.  Musculoskeletal:     Right lower leg: No edema.     Left lower leg: No edema.  Skin:    General: Skin is warm.     Capillary Refill: Capillary refill takes less than 2 seconds.  Neurological:     Mental Status: He is disoriented and confused.  5.  Lab Results: BMP BNP (last 3 results) Recent Labs    09/09/20 0233 09/10/20 0400 09/11/20 0320  BNP 1,335.2* 1,570.9* 2,312.1*    ProBNP (last 3 results) No results for input(s): PROBNP in the last 8760 hours. BMP Latest Ref Rng & Units 09/11/2020 09/10/2020 09/10/2020  Glucose 70 - 99 mg/dL 137(H) - 131(H)  BUN 8 - 23 mg/dL 35(H) - 26(H)  Creatinine 0.61 - 1.24 mg/dL 1.59(H) - 1.33(H)  Sodium 135 - 145  mmol/L 139 140 139  Potassium 3.5 - 5.1 mmol/L 3.2(L) 3.4(L) 3.5  Chloride 98 - 111 mmol/L 97(L) - 101  CO2 22 - 32 mmol/L 26 - 30  Calcium 8.9 - 10.3 mg/dL 8.3(L) - 8.0(L)   Hepatic Function Latest Ref Rng & Units 09/10/2020 09/09/2020 09/08/2020  Total Protein 6.5 - 8.1 g/dL 5.9(L) 5.4(L) 5.7(L)  Albumin 3.5 - 5.0 g/dL 2.6(L) 2.6(L) 2.8(L)  AST 15 - 41 U/L 96(H) 161(H) 314(H)  ALT 0 - 44 U/L 60(H) 80(H) 111(H)  Alk Phosphatase 38 - 126 U/L 85 86 77  Total Bilirubin 0.3 - 1.2 mg/dL 1.1 1.0 1.1  Bilirubin, Direct 0.0 - 0.2 mg/dL 0.2 - -   CBC Latest Ref Rng & Units 09/11/2020 09/10/2020 09/10/2020  WBC 4.0 - 10.5 K/uL 10.7(H) - 12.4(H)  Hemoglobin 13.0 - 17.0 g/dL 11.4(L) 13.3 11.5(L)  Hematocrit 39.0 - 52.0 % 32.3(L) 39.0 34.3(L)  Platelets 150 - 400 K/uL 300 - 260   Lipid Panel     Component Value Date/Time   CHOL 182 08/23/2020 1551   TRIG 225 (H) 08/25/2020 1551   HDL 31 (L) 09/13/2020 1551   CHOLHDL 5.9 09/02/2020 1551   VLDL 45 (H) 09/16/2020 1551   LDLCALC 106 (H) 09/03/2020  1551   Cardiac Panel (last 3 results) Recent Labs    09/09/20 0233  CKTOTAL 880*  CKMB 19.6*  RELINDX 2.2     HEMOGLOBIN A1C Lab Results  Component Value Date   HGBA1C 5.3 09/07/2020   MPG 105 09/07/2020   TSH Recent Labs    08/22/2020 1859  TSH 2.235    BNP (last 3 results) Recent Labs    09/09/20 0233 09/10/20 0400 09/11/20 0320  BNP 1,335.2* 1,570.9* 2,312.1*     ProBNP (last 3 results) No results for input(s): PROBNP in the last 8760 hours.  Imaging: Portable chest x-ray 09/09/2020:  Central lines and tubes in stable position.  Cardiomegaly again noted.  Bilateral pulmonary infiltrates/edema again noted.  Findings suggestive of CHF with bilateral pleural effusion.  DG Chest X-ray 09/02/2020:  Cardiac shadow is within normal limits. Prior coronary stenting is seen. Intra-aortic balloon pump is noted just below the aortic knob. Lungs are well aerated bilaterally with mild  interstitial edema.  IMPRESSION: Mild interstitial edema. Balloon pump in satisfactory position.  Cardiac Studies: Left heart catheterization 09/04/2020: LV 56/15, EDP 21 mmHg.  Aorta 75/44, mean 57 mmHg.  No pressure gradient across the aortic valve.  Markedly elevated EDP. LM: Large vessel, bifurcates into circumflex and LAD. LAD: Has ulcerated subtotally occluded proximal LAD stenosis that extends to the mid segment.  There is TIMI I flow also in the D1 and D2.  Moderate-sized D1 and small sized D2.  Mild disease in the mid to distal LAD. CX: Moderate sized vessel, gives origin to a large OM1, OM 2 is very small, there is a lesion after OM 2 at 80% focal lesion.  OM 3 is moderate sized with secondary branches. RCA: Dominant.  Proximal segment 90% stenosis, tandem 30 to 40% stenosis in the midsegment followed by a high-grade 90% stenosis.  Mild disease in the distal right coronary.   Intervention: Successful 2 overlapping stent implantation with 3.0 x 24 mm followed by 3.0 x 16 mm Synergy XD stents, stenosis reduced from 99% to 0%, TIMI I improved to TIMI-3 flow.   Recommendation: Patient will need relook at the LAD stent in view of severe spasm and cardiogenic shock and need for pressor support in the cardiac catheterization lab.  Patient was started on Levophed due to low blood pressure and intra-aortic balloon pump was inserted percutaneously prior to angioplasty.  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  115 mL contrast utilized.  Echocardiogram 09/07/2020:   1. Left ventricular ejection fraction, by estimation, is 20 to 25%. The left ventricle has severely decreased function. The left ventricle demonstrates regional wall motion abnormalities (see scoring diagram/findings for description). Left ventricular diastolic parameters were normal. There is akinesis of the left  ventricular, entire anterior wall, anterolateral wall and apical segment.   2. Right ventricular systolic function is normal. The right ventricular size is normal. There is mildly elevated pulmonary artery systolic pressure. The estimated right ventricular systolic pressure is 14.9 mmHg.   3. Left atrial size was mildly dilated.   4. The mitral valve is normal in structure. No evidence of mitral valve regurgitation. No evidence of mitral stenosis.   5. The aortic valve is normal in structure. Aortic valve regurgitation is not visualized. No aortic stenosis is present.  Limited echocardiogram 09/11/2020: LVEF 10 to 20%.  Global hypokinesis, anterior, anterolateral and apical akinesis.  No  significant valvular abnormality.  No pericardial effusion.  EKG:   EKG 09/10/2020: Normal sinus rhythm at rate of 83 bpm, normal axis, poor R wave progression, cannot exclude anteroseptal infarct old.  Nonspecific ST-T abnormality.  Compared to 09/15/2020, minimal anterolateral ST elevation not present.  09/03/2020: 1557 hrs.: Ventricular fibrillation. 08/19/2020 at 1550 hrs.: Anteroseptal and high lateral STEMI.   Telemetry: Sinus rhythm with runs of nonsustained ventricular tachycardia 6-8 beats  Scheduled Meds:  etomidate       fentaNYL       midazolam       rocuronium bromide       aspirin EC  81 mg Oral Daily   atorvastatin  80 mg Oral Daily   Chlorhexidine Gluconate Cloth  6 each Topical Daily   docusate  100 mg Oral BID   feeding supplement  237 mL Oral BID BM   [START ON 7/54/3606] folic acid  1 mg Oral Daily   furosemide       insulin aspart  0-15 Units Subcutaneous Q4H   ivabradine  7.5 mg Oral BID WC   mouth rinse  15 mL Mouth Rinse BID   multivitamin with minerals  1 tablet Oral Daily   [START ON 09/12/2020] polyethylene glycol  17 g Oral Daily   potassium chloride  40 mEq Oral BID   QUEtiapine  25 mg Oral QHS   sodium chloride flush  3 mL Intravenous Q12H   ticagrelor  90 mg Oral BID    Continuous Infusions:  sodium chloride Stopped (09/07/20 0119)   sodium chloride Stopped (09/07/20 1513)   DOPamine 2.5 mcg/kg/min (09/11/20 1754)   furosemide (LASIX) 200 mg in dextrose 5% 100 mL (36m/mL) infusion 8 mg/hr (09/11/20 1542)   heparin 1,500 Units/hr (09/11/20 1744)   milrinone     norepinephrine (LEVOPHED) Adult infusion Stopped (09/10/20 1707)   PRN Meds:.acetaminophen (TYLENOL) oral liquid 160 mg/5 mL, albuterol, LORazepam, nitroGLYCERIN, ondansetron (ZOFRAN) IV, sodium chloride flush  Assessment/Plan:   1.  Acute respiratory distress secondary to pulmonary edema 2.  Cardiogenic shock.  4.  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  Acute renal failure secondary to ATN from cardiogenic shock.    Recommendation:   Patient in respiratory distress and breathing at 35 bpm, mental status also much reduced, suspect it is due to hypoperfusion from cardiogenic shock.  We will go ahead and intubate him and secure the airway, continue dopamine and Primacor for now, watch out for urine output and hemodynamics,   if no improvement, would recommend mechanical support with Impella or intra-aortic balloon pump.  It has been very difficult to manage his confusional state.  He still has relatively good peripheral perfusion and if on inotropes does not show response in an hour or so, I will proceed with circulatory support.  Monitor lactic acid tomorrow and Coox and UOP closely.   D/W CCM (Dr. AElsworth Soho.   Critical care time 35 minutes   JAdrian Prows MD, F[facility]6/24/2022, 6:33 PM Office: [phone]: [phone]: [phone]

## 2020-09-11 NOTE — Progress Notes (Addendum)
eLink Physician-Brief Progress Note Patient Name: Jesse Lowe DOB: 1950/06/09 MRN: [id number]   Date of Service  09/11/2020  HPI/Events of Note  Pt just intubated at shift change. May you check Xray for OG tube and ETT placement?  Also pt is febrile 37.9 C. May he get PRN tylenol per tube   Earlier hand off done by Dr [name]  CHF- re intubated. Cardiogenic shock. STEMI 6/19. On milrinone now.   eICU Interventions  Chest X ray/KUB reviewed: NG tube tip not seen. ET in place.   Tylenol ordered, blood culture from 23 rd pending Get KUB. Ordering.  ABG: respiratory alkalosis. Vent adjusted- Vt from 8 ml to 7 ml/kg ibw. Follow ABG at mid night Discussed with bed side RN.     Intervention Category Intermediate Interventions: Diagnostic test evaluation  Ranee Gosselin 09/11/2020, 7:46 PM   21;10 KUB film reviewed. NG tip going towards stomach. Air gurgling test is positive. Ok to use it  Discussed with RN

## 2020-09-11 NOTE — Progress Notes (Signed)
eLink Physician-Brief Progress Note Patient Name: Legrand Lasser DOB: 02-Jul-1950 MRN: [id number]   Date of Service  09/11/2020  HPI/Events of Note  Agitation - Already on Seroquel and QTc interval = 0.584 seconds.   eICU Interventions  Plan: Melatonin 3 mg PO X 1 now.     Intervention Category Major Interventions: Delirium, psychosis, severe agitation - evaluation and management  Aaylah Pokorny [place] 09/11/2020, 12:04 AM

## 2020-09-11 NOTE — Progress Notes (Signed)
K+ 3.2 Replaced per protocol  

## 2020-09-11 NOTE — Progress Notes (Signed)
CARDIAC REHAB PHASE I   Assisted RN to get pt back into bed. Pt not oriented or following commands at this time. Not appropriate for ambulation or education at this time. Will continue to follow as pt becomes more appropriate.   [id number] Reynold Bowen, RN BSN 09/11/2020 11:35 AM

## 2020-09-11 NOTE — Progress Notes (Addendum)
ANTICOAGULATION CONSULT NOTE  Pharmacy Consult for heparin Indication: chest pain/ACS  No Known Allergies  Patient Measurements: Height: 5\' 9"  (175.3 cm) Weight: 84.2 kg (185 lb 10 oz) IBW/kg (Calculated) : 70.7 Heparin Dosing Weight: 90kg  Vital Signs: Temp: 97.5 F (36.4 C) (06/24 1100) Temp Source: Axillary (06/24 1100) BP: 93/64 (06/24 1600) Pulse Rate: 73 (06/24 1600)  Labs: Recent Labs    09/08/20 1818 09/09/20 0233 09/09/20 0511 09/09/20 1109 09/10/20 0400 09/10/20 0529 09/11/20 0320  HGB  --  12.0*   < >  --  11.5* 13.3 11.4*  HCT  --  35.9*   < >  --  34.3* 39.0 32.3*  PLT  --  257  --   --  260  --  300  HEPARINUNFRC <0.10* 0.19*  --   --   --   --   --   CREATININE  --  1.28*  --   --  1.33*  --  1.59*  CKTOTAL  --  880*  --   --   --   --   --   CKMB  --  19.6*  --   --   --   --   --   TROPONINIHS  --   --   --  >24,000*  --   --   --    < > = values in this interval not displayed.     Estimated Creatinine Clearance: 44.5 mL/min (A) (by C-G formula based on SCr of 1.59 mg/dL (H)).   Assessment: 76 YOM presenting to [facility] with chest pain, code stemi initiated. Patient s/p stent to LAD and IABP inserted for support.  IABP removed 6/20 and patient started on SQ heparin for VTE ppx.  Has been on and off IV heparin.  He is currently on prophylactic Lovenox and Pharmacy asked to restart IV heparin for possible LV thrombus.  Last Lovenox dose was today 6/24 at 1300.  Patient was previously sub-therapeutic on IV heparin at 1250 units/hr.  Goal of Therapy:  Heparin level 0.3-0.7 units/ml Monitor platelets by anticoagulation protocol: Yes   Plan:  D/C Lovenox Resume IV heparin at 1500 units/hr - no bolus with recent Lovenox Check 6 hr heparin level Daily heparin level and CBC  Kalandra Masters D. 7/24, PharmD, BCPS, BCCCP 09/11/2020, 4:29 PM  ==============================  Addendum: Add milrinone CrCL < 50 ml/min, start at 0.25 mcg/kg/min Further adjustment  per MD  09/13/2020 D. Chelsea Aus, PharmD, BCPS, BCCCP 09/11/2020, 5:49 PM

## 2020-09-11 NOTE — Progress Notes (Signed)
Called to room  Pt agitated. Restless. RR 40s, marked accessory use.  Intubated  Added norepi  Plan Cont full vent support Pad protocol Cont norepi MAP goal > 65 Cont inotropes.  Repeat scvo2  May need to consider mechanical support.   Jesse Lowe ACNP-BC [facility] Pulmonary/Critical Care Pager # [phone] OR # [phone] if no answer

## 2020-09-11 NOTE — Progress Notes (Signed)
eLink Physician-Brief Progress Note Patient Name: Jesse Lowe DOB: May 14, 1950 MRN: [id number]   Date of Service  09/11/2020  HPI/Events of Note  Agitation - Nursing request for 1:1 safety sitter.   eICU Interventions  Will order 1:1 Recruitment consultant.      Intervention Category Major Interventions: Delirium, psychosis, severe agitation - evaluation and management  Lenell Antu 09/11/2020, 6:37 AM

## 2020-09-11 NOTE — Progress Notes (Signed)
NAME:  Jesse Lowe, MRN:  [id number], DOB:  22-Oct-1950, LOS: 5 ADMISSION DATE:  09/02/2020, CONSULTATION DATE:  6/20 REFERRING MD:  Jacinto Halim, CHIEF COMPLAINT:  delirium and respiratory distress    History of Present Illness:  69 yr old male who was admitted 6/19 w/ cc: severe crushing CP. Code STEMI activated. While waiting had VF arrest req'd ACLS w/ defib, amio epi; estimated 5 minutes to ROSC. Was awake and alert after ROSC obtained. Went to cath lab emergently. Found to have lesion in LAD and high grade stenosis of the RCA and Circ. Underwent successful stent of LAD. And IAB placement. He was weaned off pressors. IABP dc'd am 6/20. Progressively more confused over course of day (had received Ambien that evening) 6/20 w/ increased WOB and agitation. PCCM consulted that afternoon for agitated delirium.   Pertinent  Medical History  HTN and HL Had not seen MD in 10-15 yrs.  Significant Hospital Events: Including procedures, antibiotic start and stop dates in addition to other pertinent events   6/19 admitted w/ STEMI. While waiting had VF arrest req'd ACLS w/ defib, amio epi; estimated 5 minutes to ROSC. Was awake and alert after ROSC obtained. Went to cath lab emergently. Found to have lesion in LAD and high grade stenosis of the RCA and Circ. Underwent successful stent of LAD. And IAB placement 6/20 off IABP. Pressors off. Progressive agitation and WOB. Intubated PCCM asked to consult 6/22 Extubated    Echo 09/07/2020 > Left ventricular ejection fraction, by estimation, is 20 to 25%. The left ventricle has severely decreased function. The left ventricle demonstrates regional wall motion abnormalities (see scoring diagram/findings for description). Left ventricular diastolic parameters were normal. There is akinesis of the left ventricular, entire anterior wall, anterolateral wall and apical Segment, mildly elevated pulmonary artery systolic pressure. The estimated right ventricular systolic  pressure is 33.9 mmHg. Left atrial size was mildly dilated.  Interim History / Subjective:  Increasing respiratory distress overnight. On bipap again.  More confused.  Objective   Blood pressure 94/64, pulse 72, temperature (!) 97.5 F (36.4 C), temperature source Axillary, resp. rate (!) 37, height 5\' 9"  (1.753 m), weight 84.2 kg, SpO2 100 %. CVP:  [4 mmHg-15 mmHg] 15 mmHg  FiO2 (%):  [60 %-100 %] 60 %   Intake/Output Summary (Last 24 hours) at 09/11/2020 1309 Last data filed at 09/11/2020 0800 Gross per 24 hour  Intake 104.27 ml  Output 1130 ml  Net -1025.73 ml    Filed Weights   08/26/2020 1552 09/10/20 0600 09/11/20 0500  Weight: 90.7 kg 90.4 kg 84.2 kg    Examination: General: adult male, lying in bed  HEENT: Dry oral mucosa Neuro: Not responsive to verbal commands agitated tachypneic.   CV: JVP elevated to 4-5 cm above sternal angle.,  Apex beat not displaced or sustained.  First and second heart sounds distant but unremarkable.  No murmurs or gallops appreciated.  No peripheral edema. GI: s abdomen. GU: Condom catheter in place. Extremities: warm/dry Skin: no rashes or lesions  Labs/imaging that I havepersonally reviewed  (right click and "Reselect all SmartList Selections" daily)  CXR 6/ 24 improving pulmonary edema. SCV O2 remains marginal at 55.6.  Increased to 82 following increase in milrinone. Creatinine risen slightly to 1.59 prior to increase in milrinone.  Resolved Hospital Problem list     Assessment & Plan:   Acute metabolic encephalopathy. Suspect that this is multi-factorial: drug induced (ambien), ETOH, but also wonder could this be occult shock  or hypoxia related. >> Improving  Plan Continue low-dose Seroquel Continue thiamine and folic acid  Acute hypoxic respiratory failure w/ progressive pulmonary pulm edema  Intubated 6/20, Extubated 6/22 Plan BiPAP PRN and HS > ECHO with mild pulmonary HTN Titrate Supplemental oxygen for saturation goal  >92 Continue to diuresis as below   Acute systolic HF w/ EF 20-25%; STEMI s/p stent to LAD, 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  CVP 11 > 6/22, have asked RN to obtain this AM  Co-0x 63 -echo w/ severe LV dysfxn. EF 20-25% -Echo 6/21 EF looks about 10% Plan Increased milrinone to 0.25 today.  Follow Co. oximetry. Increase diuresis. Repeat echocardiogram to assess LV function.   AKI in setting of ATN secondary to shock state Plan Trend BMP Replace electrolytes as indicated  Continue diuresis   Hyperglycemia -Hemoglobin AIC 5.3 Plan Trend Glucose, SSI   Abnormal LFTs, improving, suspect secondary to ischemia/hypoxia  Plan Trend LFTs  Nutrition Plan Bedside swallow evaluation, if fails will obtain speech therapy consult   Elevated WBC and low grade fever > Improving, suspect reactive  Plan Trend WBC and Fever Curve Obtain Cultures   Best Practice (right click and "Reselect all SmartList Selections" daily)   Diet/type:Tube feeds Pain/Anxiety/Delirium protocol Not indicated and RASS goal -1 VAP protocol (if indicated): Not indicated DVT prophylaxis: prophylactic heparin  GI prophylaxis: PPI Glucose control:  SSI Central venous access:  Yes, and it is still needed Arterial line:  N/A and Yes, and it is still needed Foley:  N/A and Yes, and it is still needed Mobility:  bed rest  PT consulted: N/A Studies pending: None Culture data pending:urine  and blood Last reviewed culture data:today Antibiotics:not indicated  Antibiotic de-escalation: no,  continue current rx Stop date: N/A Daily labs: ordered Code Status:  full code Last date of multidisciplinary goals of care discussion [pending ] ccm prognosis: Life-threating Disposition: remains critically ill, will stay in intensive care  Labs   CBC: Recent Labs  Lab 09/04/2020 1551 09/07/20 0137 09/07/20 1533 09/08/20 0418 09/08/20 0430 09/09/20 0233  09/09/20 0511 09/10/20 0400 09/10/20 0529 09/11/20 0320  WBC 7.3 10.0  --  14.5*  --  13.0*  --  12.4*  --  10.7*  NEUTROABS 3.3  --   --   --   --   --   --  10.3*  --   --   HGB 13.8 12.9*   < > 13.0   < > 12.0* 11.2* 11.5* 13.3 11.4*  HCT 38.3* 36.6*   < > 38.5*   < > 35.9* 33.0* 34.3* 39.0 32.3*  MCV 95.3 91.0  --  91.4  --  92.1  --  91.7  --  92.6  PLT 361 324  --  329  --  257  --  260  --  300   < > = values in this interval not displayed.     Basic Metabolic Panel: Recent Labs  Lab 09/07/20 1818 09/08/20 0418 09/08/20 0430 09/09/20 0233 09/09/20 0511 09/10/20 0400 09/10/20 0529 09/11/20 0320  NA 139 140   < > 138 138 139 140 139  K 3.8 3.6   < > 3.5 3.6 3.5 3.4* 3.2*  CL 110 107  --  106  --  101  --  97*  CO2 21* 21*  --  27  --  30  --  26  GLUCOSE 175* 156*  --  131*  --  131*  --  137*  BUN 14 16  --  23  --  26*  --  35*  CREATININE 1.29* 1.36*  --  1.28*  --  1.33*  --  1.59*  CALCIUM 8.1* 8.0*  --  8.1*  --  8.0*  --  8.3*  MG 1.9 2.0  --  2.1  --  2.1  --  2.5*  PHOS  --   --   --   --   --  2.8  --  4.3   < > = values in this interval not displayed.    GFR: Estimated Creatinine Clearance: 44.5 mL/min (A) (by C-G formula based on SCr of 1.59 mg/dL (H)). Recent Labs  Lab 09/07/20 2236 09/08/20 0154 09/08/20 0418 09/09/20 0233 09/10/20 0400 09/10/20 0833 09/11/20 0320  PROCALCITON  --   --   --   --   --  0.32 0.35  WBC  --   --  14.5* 13.0* 12.4*  --  10.7*  LATICACIDVEN 4.3* 3.4*  --  1.3  --  1.4  --      Liver Function Tests: Recent Labs  Lab 08/30/2020 1551 09/07/20 0137 09/08/20 0418 09/09/20 0233 09/10/20 0400  AST 26 683* 314* 161* 96*  ALT 18 143* 111* 80* 60*  ALKPHOS 97 85 77 86 85  BILITOT 0.6 0.9 1.1 1.0 1.1  PROT 6.3* 5.7* 5.7* 5.4* 5.9*  ALBUMIN 3.5 3.1* 2.8* 2.6* 2.6*    No results for input(s): LIPASE, AMYLASE in the last 168 hours. No results for input(s): AMMONIA in the last 168 hours.  ABG    Component Value  Date/Time   PHART 7.524 (H) 09/10/2020 0529   PCO2ART 37.3 09/10/2020 0529   PO2ART 100 09/10/2020 0529   HCO3 30.7 (H) 09/10/2020 0529   TCO2 32 09/10/2020 0529   ACIDBASEDEF 2.0 09/08/2020 0430   O2SAT 82.1 09/11/2020 1029      Coagulation Profile: Recent Labs  Lab 09/05/2020 1551 09/07/20 1818  INR 1.1 1.2     Cardiac Enzymes: Recent Labs  Lab 09/09/20 0233  CKTOTAL 880*  CKMB 19.6*     HbA1C: Hgb A1c MFr Bld  Date/Time Value Ref Range Status  09/07/2020 06:14 PM 5.3 4.8 - 5.6 % Final    Comment:    (NOTE)         Prediabetes: 5.7 - 6.4         Diabetes: >6.4         Glycemic control for adults with diabetes: <7.0   09/05/2020 06:59 PM 5.5 4.8 - 5.6 % Final    Comment:    (NOTE)         Prediabetes: 5.7 - 6.4         Diabetes: >6.4         Glycemic control for adults with diabetes: <7.0     CBG: Recent Labs  Lab 09/10/20 1955 09/11/20 0005 09/11/20 0414 09/11/20 0653 09/11/20 1145  GLUCAP 120* 138* 141* 129* 129*     CRITICAL CARE Performed by: Lynnell Catalan   Total critical care time: 40 minutes  Critical care time was exclusive of separately billable procedures and treating other patients.  Critical care was necessary to treat or prevent imminent or life-threatening deterioration.  Critical care was time spent personally by me on the following activities: development of treatment plan with patient and/or surrogate as well as nursing, discussions with consultants, evaluation of patient's response to  treatment, examination of patient, obtaining history from patient or surrogate, ordering and performing treatments and interventions, ordering and review of laboratory studies, ordering and review of radiographic studies, pulse oximetry, re-evaluation of patient's condition and participation in multidisciplinary rounds.  Lynnell Catalan, MD [facility] ICU Physician [facility] Critical Care  Pager: [phone] Mobile: [phone] After hours:  [phone].

## 2020-09-12 ENCOUNTER — Inpatient Hospital Stay (HOSPITAL_COMMUNITY): Payer: Medicare Other

## 2020-09-12 DIAGNOSIS — N179 Acute kidney failure, unspecified: Secondary | ICD-10-CM

## 2020-09-12 DIAGNOSIS — R570 Cardiogenic shock: Secondary | ICD-10-CM

## 2020-09-12 LAB — COMPREHENSIVE METABOLIC PANEL
ALT: 129 U/L — ABNORMAL HIGH (ref 0–44)
AST: 91 U/L — ABNORMAL HIGH (ref 15–41)
Albumin: 2.8 g/dL — ABNORMAL LOW (ref 3.5–5.0)
Alkaline Phosphatase: 93 U/L (ref 38–126)
Anion gap: 9 (ref 5–15)
BUN: 32 mg/dL — ABNORMAL HIGH (ref 8–23)
CO2: 29 mmol/L (ref 22–32)
Calcium: 7.9 mg/dL — ABNORMAL LOW (ref 8.9–10.3)
Chloride: 106 mmol/L (ref 98–111)
Creatinine, Ser: 1.39 mg/dL — ABNORMAL HIGH (ref 0.61–1.24)
GFR, Estimated: 55 mL/min — ABNORMAL LOW (ref >60)
Glucose, Bld: 122 mg/dL — ABNORMAL HIGH (ref 70–99)
Potassium: 3.6 mmol/L (ref 3.5–5.1)
Sodium: 144 mmol/L (ref 135–145)
Total Bilirubin: 0.8 mg/dL (ref 0.3–1.2)
Total Protein: 6.1 g/dL — ABNORMAL LOW (ref 6.5–8.1)

## 2020-09-12 LAB — BASIC METABOLIC PANEL
Anion gap: 11 (ref 5–15)
BUN: 35 mg/dL — ABNORMAL HIGH (ref 8–23)
CO2: 27 mmol/L (ref 22–32)
Calcium: 8.1 mg/dL — ABNORMAL LOW (ref 8.9–10.3)
Chloride: 104 mmol/L (ref 98–111)
Creatinine, Ser: 1.54 mg/dL — ABNORMAL HIGH (ref 0.61–1.24)
GFR, Estimated: 49 mL/min — ABNORMAL LOW (ref >60)
Glucose, Bld: 136 mg/dL — ABNORMAL HIGH (ref 70–99)
Potassium: 3.8 mmol/L (ref 3.5–5.1)
Sodium: 142 mmol/L (ref 135–145)

## 2020-09-12 LAB — MAGNESIUM: Magnesium: 2.8 mg/dL — ABNORMAL HIGH (ref 1.7–2.4)

## 2020-09-12 LAB — CBC
HCT: 35.2 % — ABNORMAL LOW (ref 39.0–52.0)
Hemoglobin: 12 g/dL — ABNORMAL LOW (ref 13.0–17.0)
MCH: 31.7 pg (ref 26.0–34.0)
MCHC: 34.1 g/dL (ref 30.0–36.0)
MCV: 92.9 fL (ref 80.0–100.0)
Platelets: 337 10*3/uL (ref 150–400)
RBC: 3.79 MIL/uL — ABNORMAL LOW (ref 4.22–5.81)
RDW: 13.9 % (ref 11.5–15.5)
WBC: 12.4 10*3/uL — ABNORMAL HIGH (ref 4.0–10.5)
nRBC: 0.2 % (ref 0.0–0.2)

## 2020-09-12 LAB — HEPARIN LEVEL (UNFRACTIONATED)
Heparin Unfractionated: 0.5 IU/mL (ref 0.30–0.70)
Heparin Unfractionated: 0.88 IU/mL — ABNORMAL HIGH (ref 0.30–0.70)
Heparin Unfractionated: 0.42 IU/mL (ref 0.30–0.70)

## 2020-09-12 LAB — COOXEMETRY PANEL
Carboxyhemoglobin: 0.7 % (ref 0.5–1.5)
Methemoglobin: 0.8 % (ref 0.0–1.5)
O2 Saturation: 73.9 %
Total hemoglobin: 12.5 g/dL (ref 12.0–16.0)

## 2020-09-12 LAB — PROCALCITONIN: Procalcitonin: 0.29 ng/mL

## 2020-09-12 LAB — GLUCOSE, CAPILLARY
Glucose-Capillary: 138 mg/dL — ABNORMAL HIGH (ref 70–99)
Glucose-Capillary: 124 mg/dL — ABNORMAL HIGH (ref 70–99)
Glucose-Capillary: 116 mg/dL — ABNORMAL HIGH (ref 70–99)
Glucose-Capillary: 128 mg/dL — ABNORMAL HIGH (ref 70–99)
Glucose-Capillary: 141 mg/dL — ABNORMAL HIGH (ref 70–99)

## 2020-09-12 LAB — LACTIC ACID, PLASMA: Lactic Acid, Venous: 1.9 mmol/L (ref 0.5–1.9)

## 2020-09-12 LAB — BRAIN NATRIURETIC PEPTIDE: B Natriuretic Peptide: 2242.1 pg/mL — ABNORMAL HIGH (ref 0.0–100.0)

## 2020-09-12 MED ORDER — POTASSIUM CHLORIDE 20 MEQ PO PACK
40.0000 meq | PACK | Freq: Once | ORAL | Status: AC
  Administered 2020-09-12: 40 meq
  Filled 2020-09-12: qty 2

## 2020-09-12 MED ORDER — PIVOT 1.5 CAL PO LIQD
1000.0000 mL | ORAL | Status: DC
  Administered 2020-09-12 – 2020-09-13 (×4): 1000 mL

## 2020-09-12 MED ORDER — VITAL HIGH PROTEIN PO LIQD
1000.0000 mL | ORAL | Status: DC
  Administered 2020-09-12: 1000 mL

## 2020-09-12 NOTE — Progress Notes (Addendum)
NAME:  Jesse Lowe, MRN:  [id number], DOB:  1950/05/02, LOS: 6 ADMISSION DATE:  08/30/2020, CONSULTATION DATE:  6/20 REFERRING MD:  Jacinto Halim, CHIEF COMPLAINT:  delirium and respiratory distress    History of Present Illness:  70 yr old male admitted with anterior wall STEMI .went to cath lab emergently. Found to have lesion in LAD and high grade stenosis of the RCA and Circ. Underwent successful stent of LAD. And IAB placement. He was weaned off pressors. IABP dc'd am 6/20. Progressively more confused over course of day (had received Ambien that evening) 6/20 w/ increased WOB and agitation. PCCM consulted that afternoon for agitated delirium.   Pertinent  Medical History  HTN and HL Had not seen MD in 10-15 yrs.  Significant Hospital Events: Including procedures, antibiotic start and stop dates in addition to other pertinent events   6/19 admitted w/ STEMI. While waiting had VF arrest req'd ACLS w/ defib, amio epi; estimated 5 minutes to ROSC. Was awake and alert after ROSC obtained. Went to cath lab emergently. Found to have lesion in LAD and high grade stenosis of the RCA and Circ. Underwent successful stent of LAD. And IAB placement 6/20 off IABP. Pressors off. Progressive agitation and WOB. Intubated PCCM asked to consult  6/24 reintubated for shock, milrinone restarted    Echo 09/07/2020 > Left ventricular ejection fraction, by estimation, is 20 to 25%. The left ventricle has severely decreased function. Left ventricular diastolic parameters were normal. There is akinesis of the left ventricular, entire anterior wall, anterolateral wall and apical Segment, mildly elevated pulmonary artery systolic pressure. The estimated right ventricular systolic pressure is 33.9 mmHg. Left atrial size was mildly dilated.  Interim History / Subjective:   Reintubated yesterday p.m. for shock and delirium Milrinone was restarted. Good urine output with Lasix drip  Objective   Blood pressure 111/73,  pulse 84, temperature 100.04 F (37.8 C), resp. rate (!) 22, height 5\' 9"  (1.753 m), weight 84.2 kg, SpO2 97 %. CVP:  [4 mmHg-28 mmHg] 12 mmHg  Vent Mode: PRVC FiO2 (%):  [40 %-100 %] 40 % Set Rate:  [22 bmp] 22 bmp Vt Set:  [490 mL-560 mL] 490 mL PEEP:  [5 cmH20-8 cmH20] 5 cmH20 Plateau Pressure:  [14 cmH20-17 cmH20] 14 cmH20   Intake/Output Summary (Last 24 hours) at 09/12/2020 0745 Last data filed at 09/12/2020 0600 Gross per 24 hour  Intake 918.22 ml  Output 2370 ml  Net -1451.78 ml    Filed Weights   08/30/2020 1552 09/10/20 0600 09/11/20 0500  Weight: 90.7 kg 90.4 kg 84.2 kg    Examination: General: adult male, intubated, sedated on fentanyl, no distress HEENT: No JVD, mild pallor, no icterus Neuro: Sedated, RASS -3 Chest: No accessory muscle use, scattered crackles right base CV: S1-S2 regular, no murmur GI: Soft, nontender GU: Condom catheter in place. Extremities: warm/dry Skin: no rashes or lesions  Labs/imaging that I havepersonally reviewed  (right click and "Reselect all SmartList Selections" daily)   Chest x-ray 6/24 independently reviewed, ET tube in position, decreased pulmonary infiltrates, mild interstitial prominence  Coox 74% Labs show stable creatinine at 1.5, high BNP, normal lactate, borderline low potassium  Resolved Hospital Problem list     Assessment & Plan:   Acute metabolic encephalopathy -Initially attributed to Ambien and EtOH but 6/24 related to cardiogenic shock 6/24 Head CT negative VF arrest was brief so doubt anoxic injury Plan Goal RASS 0 to -1 , daily W UA With low EF, he will  not tolerate Precedex or propofol, using fentanyl drip Continue low-dose Seroquel Continue thiamine and folic acid  Acute hypoxic respiratory failure w/ progressive pulmonary pulm edema  6/20 ETT >> 6/22 , 6/24 >> Plan Start spontaneous breathing trials Continue diuresis  Acute systolic HF w/ EF 20-25%; STEMI s/p stent to LAD, also has sig CAD  involving the RCA and Circ  Brief V.Fib arrest secondary to above.  -echo w/ severe LV dysfxn. EF 20-25% -rpt Echo 6/21 EF looks about 10% Plan Coox has normalized with restarting milrinone, doubt mechanical support required , ct levophed Continue Lasix drip given high BNP Heparin gtt + brilinta per cards  AKI in setting of ATN secondary to shock state Plan Trend BMP Replace potassium in anticipation of continued diuresis   Hyperglycemia -Hemoglobin AIC 5.3 Plan Trend Glucose, SSI   Abnormal LFTs, improving, suspect secondary to ischemia/hypoxia  Plan Trend LFTs    Best Practice (right click and "Reselect all SmartList Selections" daily)   Diet/type:Tube feeds Pain/Anxiety/Delirium protocol Yes and RASS goal: 0 to -1 VAP protocol (if indicated): Yes DVT prophylaxis: prophylactic heparin  GI prophylaxis: PPI Glucose control:  SSI Central venous access:  Yes, and it is still needed Arterial line:  N/A and Yes, and it is still needed Foley:  N/A and Yes, and it is still needed Mobility:  bed rest  PT consulted: N/A Code Status:  full code Last date of multidisciplinary goals of care discussion - no next of kin  Disposition: remains critically ill, will stay in intensive care  Labs   CBC: Recent Labs  Lab 2020-09-24 1551 09/07/20 0137 09/08/20 0418 09/08/20 0430 09/09/20 0233 09/09/20 0511 09/10/20 0400 09/10/20 0529 09/11/20 0320 09/11/20 2010 09/11/20 2347 09/12/20 0433  WBC 7.3   < > 14.5*  --  13.0*  --  12.4*  --  10.7*  --   --  12.4*  NEUTROABS 3.3  --   --   --   --   --  10.3*  --   --   --   --   --   HGB 13.8   < > 13.0   < > 12.0*   < > 11.5* 13.3 11.4* 11.2* 10.9* 12.0*  HCT 38.3*   < > 38.5*   < > 35.9*   < > 34.3* 39.0 32.3* 33.0* 32.0* 35.2*  MCV 95.3   < > 91.4  --  92.1  --  91.7  --  92.6  --   --  92.9  PLT 361   < > 329  --  257  --  260  --  300  --   --  337   < > = values in this interval not displayed.     Basic Metabolic  Panel: Recent Labs  Lab 09/07/20 1818 09/08/20 0418 09/08/20 0430 09/09/20 0233 09/09/20 0511 09/10/20 0400 09/10/20 0529 09/11/20 0320 09/11/20 2010 09/11/20 2347 09/12/20 0433  NA 139 140   < > 138   < > 139 140 139 141 141 142  K 3.8 3.6   < > 3.5   < > 3.5 3.4* 3.2* 3.2* 3.8 3.8  CL 110 107  --  106  --  101  --  97*  --   --  104  CO2 21* 21*  --  27  --  30  --  26  --   --  27  GLUCOSE 175* 156*  --  131*  --  131*  --  137*  --   --  136*  BUN 14 16  --  23  --  26*  --  35*  --   --  35*  CREATININE 1.29* 1.36*  --  1.28*  --  1.33*  --  1.59*  --   --  1.54*  CALCIUM 8.1* 8.0*  --  8.1*  --  8.0*  --  8.3*  --   --  8.1*  MG 1.9 2.0  --  2.1  --  2.1  --  2.5*  --   --   --   PHOS  --   --   --   --   --  2.8  --  4.3  --   --   --    < > = values in this interval not displayed.    GFR: Estimated Creatinine Clearance: 45.9 mL/min (A) (by C-G formula based on SCr of 1.54 mg/dL (H)). Recent Labs  Lab 09/09/20 0233 09/10/20 0400 09/10/20 0833 09/11/20 0320 09/11/20 1700 09/12/20 0433  PROCALCITON  --   --  0.32 0.35  --  0.29  WBC 13.0* 12.4*  --  10.7*  --  12.4*  LATICACIDVEN 1.3  --  1.4  --  2.1* 1.9     Liver Function Tests: Recent Labs  Lab 09/17/2020 1551 09/07/20 0137 09/08/20 0418 09/09/20 0233 09/10/20 0400  AST 26 683* 314* 161* 96*  ALT 18 143* 111* 80* 60*  ALKPHOS 97 85 77 86 85  BILITOT 0.6 0.9 1.1 1.0 1.1  PROT 6.3* 5.7* 5.7* 5.4* 5.9*  ALBUMIN 3.5 3.1* 2.8* 2.6* 2.6*    No results for input(s): LIPASE, AMYLASE in the last 168 hours. No results for input(s): AMMONIA in the last 168 hours.  ABG    Component Value Date/Time   PHART 7.479 (H) 09/11/2020 2347   PCO2ART 39.7 09/11/2020 2347   PO2ART 213 (H) 09/11/2020 2347   HCO3 29.4 (H) 09/11/2020 2347   TCO2 31 09/11/2020 2347   ACIDBASEDEF 2.0 09/08/2020 0430   O2SAT 73.9 09/12/2020 0433      Coagulation Profile: Recent Labs  Lab 09/03/2020 1551 09/07/20 1818  INR 1.1  1.2     Cardiac Enzymes: Recent Labs  Lab 09/09/20 0233  CKTOTAL 880*  CKMB 19.6*     HbA1C: Hgb A1c MFr Bld  Date/Time Value Ref Range Status  09/07/2020 06:14 PM 5.3 4.8 - 5.6 % Final    Comment:    (NOTE)         Prediabetes: 5.7 - 6.4         Diabetes: >6.4         Glycemic control for adults with diabetes: <7.0   09/05/2020 06:59 PM 5.5 4.8 - 5.6 % Final    Comment:    (NOTE)         Prediabetes: 5.7 - 6.4         Diabetes: >6.4         Glycemic control for adults with diabetes: <7.0     CBG: Recent Labs  Lab 09/11/20 1145 09/11/20 1628 09/11/20 1952 09/12/20 0454 09/12/20 0724  GLUCAP 129* 120* 150* 138* 124*     CRITICAL CARE Performed by: Comer Locketakesh V. Kainoah Bartosiewicz   Total critical care time: 35 minutes  Critical care time was exclusive of separately billable procedures and treating other patients.  Critical care was necessary to treat or prevent imminent or life-threatening deterioration.  Critical care was time spent  personally by me on the following activities: development of treatment plan with patient and/or surrogate as well as nursing, discussions with consultants, evaluation of patient's response to treatment, examination of patient, obtaining history from patient or surrogate, ordering and performing treatments and interventions, ordering and review of laboratory studies, ordering and review of radiographic studies, pulse oximetry, re-evaluation of patient's condition and participation in multidisciplinary rounds.   Cyril Mourning MD. Tonny Bollman. [facility] Pager : [phone]  If no response to pager , please call [phone] until 7 pm After 7:00 pm call Elink  [phone]   09/12/2020

## 2020-09-12 NOTE — Progress Notes (Addendum)
Subjective:  Remains intubated, sedated  Objective:  Vital Signs in the last 24 hours: Temp:  [96.7 F (35.9 C)-100.22 F (37.9 C)] 100.22 F (37.9 C) (06/25 0800) Pulse Rate:  [72-112] 86 (06/25 0842) Resp:  [21-41] 22 (06/25 0842) BP: (93-135)/(55-89) 118/81 (06/25 0842) SpO2:  [89 %-100 %] 100 % (06/25 0842) FiO2 (%):  [40 %-100 %] 40 % (06/25 0842)  Intake/Output from previous day: 06/24 0701 - 06/25 0700 In: 918.2 [I.V.:668.5; NG/GT:60; IV Piggyback:189.7] Out: 2370 [Urine:2370]  Physical Exam Vitals and nursing note reviewed.  Constitutional:      General: He is not in acute distress.    Appearance: He is well-developed.  HENT:     Head: Normocephalic and atraumatic.  Eyes:     Conjunctiva/sclera: Conjunctivae normal.     Pupils: Pupils are equal, round, and reactive to light.  Neck:     Vascular: No JVD.  Cardiovascular:     Rate and Rhythm: Normal rate and regular rhythm.     Pulses: Normal pulses and intact distal pulses.     Heart sounds: No murmur heard. Pulmonary:     Effort: Pulmonary effort is normal.     Breath sounds: Normal breath sounds. No wheezing or rales.  Abdominal:     General: Bowel sounds are normal.     Palpations: Abdomen is soft.     Tenderness: There is no rebound.  Musculoskeletal:        General: No tenderness. Normal range of motion.     Right lower leg: No edema.     Left lower leg: No edema.  Lymphadenopathy:     Cervical: No cervical adenopathy.  Skin:    General: Skin is warm and dry.  Neurological:     Mental Status: He is alert.     Cranial Nerves: No cranial nerve deficit.     Comments: Intubated, sedated   .phy   Lab Results: BMP Recent Labs    09/10/20 0400 09/10/20 0529 09/11/20 0320 09/11/20 2010 09/11/20 2347 09/12/20 0433  NA 139   < > 139 141 141 142  K 3.5   < > 3.2* 3.2* 3.8 3.8  CL 101  --  97*  --   --  104  CO2 30  --  26  --   --  27  GLUCOSE 131*  --  137*  --   --  136*  BUN 26*  --  35*   --   --  35*  CREATININE 1.33*  --  1.59*  --   --  1.54*  CALCIUM 8.0*  --  8.3*  --   --  8.1*  GFRNONAA 58*  --  47*  --   --  49*   < > = values in this interval not displayed.    CBC Recent Labs  Lab 09/10/20 0400 09/10/20 0529 09/12/20 0433  WBC 12.4*   < > 12.4*  RBC 3.74*   < > 3.79*  HGB 11.5*   < > 12.0*  HCT 34.3*   < > 35.2*  PLT 260   < > 337  MCV 91.7   < > 92.9  MCH 30.7   < > 31.7  MCHC 33.5   < > 34.1  RDW 13.9   < > 13.9  LYMPHSABS 0.9  --   --   MONOABS 1.1*  --   --   EOSABS 0.0  --   --   BASOSABS 0.0  --   --    < > =  values in this interval not displayed.    HEMOGLOBIN A1C Lab Results  Component Value Date   HGBA1C 5.3 09/07/2020   MPG 105 09/07/2020    Cardiac Panel (last 3 results) Recent Labs    09/09/20 0233  CKTOTAL 880*  CKMB 19.6*  RELINDX 2.2    BNP (last 3 results) Recent Labs    09/10/20 0400 09/11/20 0320 09/12/20 0433  BNP 1,570.9* 2,312.1* 2,242.1*    TSH Recent Labs    08/20/2020 1859  TSH 2.235    Lipid Panel     Component Value Date/Time   CHOL 182 08/25/2020 1551   TRIG 225 (H) 09/08/2020 1551   HDL 31 (L) 09/14/2020 1551   CHOLHDL 5.9 08/27/2020 1551   VLDL 45 (H) 09/11/2020 1551   LDLCALC 106 (H) 08/27/2020 1551     Hepatic Function Panel Recent Labs    09/08/20 0418 09/09/20 0233 09/10/20 0400  PROT 5.7* 5.4* 5.9*  ALBUMIN 2.8* 2.6* 2.6*  AST 314* 161* 96*  ALT 111* 80* 60*  ALKPHOS 77 86 85  BILITOT 1.1 1.0 1.1  BILIDIR  --   --  0.2  IBILI  --   --  0.9    Imaging: CXR 09/12/2020: IMPRESSION: 1. Improved interstitial edema. 2. Persistent, left greater than right, lung base opacities consistent with a combination of small effusions and atelectasis. 3. Stable well-positioned support apparatus.    Cardiac Studies:  EKG 09/11/2020: Sinus rhythm 84 bpm Anterior infarct, age indeterminate Prolonged Qtc  Echocardiogram 09/12/2020:  1. Left ventricular ejection fraction, by  estimation, is <20%. The left  ventricle has severely decreased function. The left ventricle demonstrates  regional wall motion abnormalities (see scoring diagram/findings for  description). Left ventricular  diastolic function could not be evaluated. There is akinesis of the left  ventricular, entire anterior wall, apical segment and anterolateral wall.   2. Right ventricular systolic function was not well visualized.   3. The mitral valve is normal in structure. No evidence of mitral valve  regurgitation. No evidence of mitral stenosis.   4. The aortic valve is normal in structure. Aortic valve regurgitation is  not visualized. No aortic stenosis is present.   5. The inferior vena cava is dilated in size with <50% respiratory  variability, suggesting right atrial pressure of 15 mmHg.   Conclusion(s)/Recommendation(s): The LV shows mild global hypokinesis  along with wall motion abnormality. LVEF 10-15%. Definity contrast used.  No LV mural thrombus. RV systolic function not assessed due to poor  visualization.   Left heart catheterization 09/09/2020: LV 56/15, EDP 21 mmHg.  Aorta 75/44, mean 57 mmHg.  No pressure gradient across the aortic valve.  Markedly elevated EDP. LM: Large vessel, bifurcates into circumflex and LAD. LAD: Has ulcerated subtotally occluded proximal LAD stenosis that extends to the mid segment.  There is TIMI I flow also in the D1 and D2.  Moderate-sized D1 and small sized D2.  Mild disease in the mid to distal LAD. CX: Moderate sized vessel, gives origin to a large OM1, OM 2 is very small, there is a lesion after OM 2 at 80% focal lesion.  OM 3 is moderate sized with secondary branches. RCA: Dominant.  Proximal segment 90% stenosis, tandem 30 to 40% stenosis in the midsegment followed by a high-grade 90% stenosis.  Mild disease in the distal right coronary.   Intervention: Successful 2 overlapping stent implantation with 3.0 x 24 mm followed by 3.0 x 16 mm Synergy XD  stents, stenosis  reduced from 99% to 0%, TIMI I improved to TIMI-3 flow.   Recommendation: Patient will need relook at the LAD stent in view of severe spasm and cardiogenic shock and need for pressor support in the cardiac catheterization lab.  Patient was started on Levophed due to low blood pressure and intra-aortic balloon pump was inserted percutaneously prior to angioplasty.  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  115 mL contrast utilized.  Assessment & Recommendations:  70 y/o Caucasian male with anterior STEMI (09/13/2020) complicated by Vfib cardiac arrest (08/21/2020), cardiogenic shock, acute systolic heart failure  Cardiogenic shock: Anterior STEMI (09/17/2020) Successful PPCI prox-mid LAD Cardiogenic shock 2/2 AMI IABP 6/19, removed 6//20  Currently on milrinone 0.25 mcg/kg.min, norepi 3 mcg/min Lactic acid 1.9 (6/25 morning) 2.3 L UOP/24 hrs. Net -ve 5.5 L Co-ox 73% (6/25). With Tmax 100.6, monitor for any component of sepsis.  Will try to wean off norepi, followed by milrinone Do not suspect he will need mechanical support at this time.   STEMI: As above Continue DAPT, lipitor 80 mg Not on BB/ARNI/MRA due to shock   Acute systolic heart failure: EF 10-15%, down from initial EF 25-30% post MI BNP 2242 (6/25/) Shock improving, no acute indication for mechanical support at this time Not on BB/ARNI/MRA due to shock Continue corlanor 7.5 mg bid  Acute hypoxic respiratory failure:  2/2 pulmonary edema, now improving. Continue diuresis. Currently on lasix drip at 8 mg/hr, diuresing well  Acute encephalopathy: Possible combination of shock, respiratory failure + ? Management as per CCM  AKI/CKD: Cr 1.54 (6/25), down from 1.59 (6/24) I suspect this will improve with diuresis, as long as no recurrent shock develops  Appreciate CCM input  CRITICAL  CARE Performed by: Truett Mainland   Total critical care time: 35 minutes   Critical care time was exclusive of separately billable procedures and treating other patients.   Critical care was necessary to treat or prevent imminent or life-threatening deterioration.   Critical care was time spent personally by me on the following activities: development of treatment plan with patient and/or surrogate as well as nursing, discussions with consultants, evaluation of patient's response to treatment, examination of patient, obtaining history from patient or surrogate, ordering and performing treatments and interventions, ordering and review of laboratory studies, ordering and review of radiographic studies, pulse oximetry and re-evaluation of patient's condition.       Elder Negus, MD Pager: [phone] Office: [phone]

## 2020-09-12 NOTE — Progress Notes (Signed)
PT Cancellation Note  Patient Details Name: Jesse Lowe MRN: [id number] DOB: 1950-08-10   Cancelled Treatment:    Reason Eval/Treat Not Completed: Medical issues which prohibited therapy.  Pt with medical decline (now intubated and sedated).  PT to sign off.  Please re-order when pt is medically ready to mobilize.  Thanks,  Corinna Capra, PT, DPT  Acute Rehabilitation Ortho Tech Supervisor [phone] pager #[phone] office      [name] 09/12/2020, 9:58 AM

## 2020-09-12 NOTE — Progress Notes (Signed)
ANTICOAGULATION CONSULT NOTE - Follow Up Consult  Pharmacy Consult for heparin Indication:  possible LV thrombus   Labs: Recent Labs    09/09/20 1109 09/10/20 0400 09/10/20 0529 09/11/20 0320 09/11/20 1750 09/11/20 2010 09/11/20 2347 09/12/20 0130 09/12/20 0223  HGB  --  11.5*   < > 11.4*  --  11.2* 10.9*  --   --   HCT  --  34.3*   < > 32.3*  --  33.0* 32.0*  --   --   PLT  --  260  --  300  --   --   --   --   --   HEPARINUNFRC  --   --   --   --  0.17*  --   --  0.88* 0.50  CREATININE  --  1.33*  --  1.59*  --   --   --   --   --   TROPONINIHS >24,000*  --   --   --   --   --   --   --   --    < > = values in this interval not displayed.    Assessment/Plan:  70yo male therapeutic on heparin after resumed for possible LV thrombus. Will continue gtt at current rate of 1500 units/hr and confirm stable with additional level.   Vernard Gambles, PharmD, BCPS  09/12/2020,3:47 AM

## 2020-09-12 NOTE — Progress Notes (Signed)
[facility] ADULT ICU REPLACEMENT PROTOCOL   The patient does apply for the [facility] Adult ICU Electrolyte Replacment Protocol based on the criteria listed below:   1. Is GFR >/= 30 ml/min? Yes.    Patient's GFR today is 55 2. Is SCr </= 2? Yes.   Patient's SCr is 1.39 ml/kg/hr 3. Did SCr increase >/= 0.5 in 24 hours? No. 4. Abnormal electrolyte(s): k (3.6) 5. Ordered repletion with: protocol, on lasix gtt 6. If a panic level lab has been reported, has the CCM MD in charge been notified? no Physician:    Markus Daft A 09/12/2020 10:00 PM

## 2020-09-12 NOTE — Progress Notes (Signed)
Nutrition Follow-up  DOCUMENTATION CODES:  Non-severe (moderate) malnutrition in context of chronic illness  INTERVENTION:  Begin TF via OGT: Begin Pivot 1.5 @ 74ml/hr, advance by 61ml/hr Q4H until goal rate of 54ml/hr (1337ml/d) is reached At goal, TF provides 1980 kcals, 123g protein, free water    Monitor for BM as none documented x5 days; regimen already in place  NUTRITION DIAGNOSIS:  Moderate Malnutrition related to chronic illness as evidenced by mild muscle depletion, mild fat depletion. -- ongoing  GOAL:  Patient will meet greater than or equal to 90% of their needs -- will address with TF  MONITOR:  Vent status, TF tolerance, Labs, Weight trends  REASON FOR ASSESSMENT:  Consult, Ventilator Enteral/tube feeding initiation and management  ASSESSMENT:  70 yo admitted with chest pain, Code STEMI activated. While waiting i, pt had VF arrest, taking to cath lab and underwent stent to LAD and IABP, intubated. PMH includes HNT, HLD; pt had not seen MD in 10-15 years  6/19 Admitted with STEMI, VF arrest, Cath Lab with stent to LAD and IABP 6/20 IABP removed 6/22 Self extubated 6/24 re-intubated 2/2 cardiogenic shock  Pt re-intubated yesterday, 6/24, for shock and delirium; milrinone restarted. Pt with good UOP w/ lasix drip. Per CCM, plan for continued diuresis and spontaneous breathing trials.   Pt with OGT, gastric tip verified via xray. RD consulted to initiate/manage tube feeding.   Patient is currently intubated on ventilator support MV: 10.9 L/min Temp (24hrs), Avg:99.7 F (37.6 C), Min:96.7 F (35.9 C), Max:100.22 F (37.9 C)  Admit weight: 90.7 kg Current weight: 84.2 kg   UOP: x24 hours I/O: - since admit  Pt continues to have generalized mild pitting edema per RN assessment  Medications: colace, folvite, SSI, mvi with minerals, protonix, miralax Drips: fentanyl, lasix, milrinone, norepinephrine Labs: ionized calcium 1.08 (L), Cr 1.54  (H, lower than yesterday) CBGs: [phone]  Diet Order:   Diet Order             Diet NPO time specified  Diet effective now                  EDUCATION NEEDS:  Not appropriate for education at this time  Skin:  Skin Assessment: Reviewed RN Assessment  Last BM:  6/20  Height:  Ht Readings from Last 1 Encounters:  28-Sep-2020 5\' 9"  (1.753 m)   Weight:  Wt Readings from Last 1 Encounters:  09/11/20 84.2 kg   BMI:  Body mass index is 27.41 kg/m.  Estimated Nutritional Needs:  Kcal:  1998 kcal Protein:  115-130 grams Fluid:  >/= 2 L/day    09/13/20, MS, RD, LDN (she/her/hers) RD pager number and weekend/on-call pager number located in Amion.

## 2020-09-12 NOTE — Progress Notes (Signed)
ANTICOAGULATION CONSULT NOTE  Pharmacy Consult for heparin Indication: post ACS with possible LV thrombus  No Known Allergies  Patient Measurements: Height: 5\' 9"  (175.3 cm) Weight: 84.2 kg (185 lb 10 oz) IBW/kg (Calculated) : 70.7 Heparin Dosing Weight: 90kg  Vital Signs: Temp: 100.4 F (38 C) (06/25 1530) Temp Source: Bladder (06/25 1200) BP: 108/72 (06/25 1530) Pulse Rate: 87 (06/25 1530)  Labs: Recent Labs    09/10/20 0400 09/10/20 0529 09/11/20 0320 09/11/20 1750 09/11/20 2010 09/11/20 2347 09/12/20 0130 09/12/20 0223 09/12/20 0433 09/12/20 1017  HGB 11.5*   < > 11.4*  --  11.2* 10.9*  --   --  12.0*  --   HCT 34.3*   < > 32.3*  --  33.0* 32.0*  --   --  35.2*  --   PLT 260  --  300  --   --   --   --   --  337  --   HEPARINUNFRC  --   --   --    < >  --   --  0.88* 0.50  --  0.42  CREATININE 1.33*  --  1.59*  --   --   --   --   --  1.54*  --    < > = values in this interval not displayed.     Estimated Creatinine Clearance: 45.9 mL/min (A) (by C-G formula based on SCr of 1.54 mg/dL (H)).   Assessment: 81 YOM presenting to [facility] with chest pain, code stemi initiated. Patient s/p stent to LAD and IABP inserted for support.  IABP removed 6/20 and patient started on SQ heparin for VTE ppx.  Has been on and off IV heparin.  He was on prophylactic Lovenox and Pharmacy asked to restart IV heparin for possible LV thrombus.  Last Lovenox dose was  6/24 at 1300.  Heparin drip 1500 uts/hr HL 0.42 at goal h/h stable   Goal of Therapy:  Heparin level 0.3-0.7 units/ml Monitor platelets by anticoagulation protocol: Yes   Plan:   Continue IV heparin at 1500 units/hr  Daily heparin level and CBC   7/24 Pharm.D. CPP, BCPS Clinical Pharmacist [phone] 09/12/2020 3:38 PM

## 2020-09-12 NOTE — Progress Notes (Addendum)
eLink Physician-Brief Progress Note Patient Name: Jesse Lowe DOB: 08-12-1950 MRN: [id number]   Date of Service  09/12/2020  HPI/Events of Note  Ordered CxR/KUB and renewed restraints to prevent self injury and harm. Was wild and might tubes might has misplaced as per RN .  eICU Interventions  As above.     Intervention Category Intermediate Interventions: Other:  Ranee Gosselin 09/12/2020, 8:08 PM  20:45  KUB film reviewed. NG tube in position. Ok to use and discussed with RN

## 2020-09-13 LAB — BASIC METABOLIC PANEL
Anion gap: 10 (ref 5–15)
BUN: 32 mg/dL — ABNORMAL HIGH (ref 8–23)
CO2: 29 mmol/L (ref 22–32)
Calcium: 8.1 mg/dL — ABNORMAL LOW (ref 8.9–10.3)
Chloride: 105 mmol/L (ref 98–111)
Creatinine, Ser: 1.46 mg/dL — ABNORMAL HIGH (ref 0.61–1.24)
GFR, Estimated: 52 mL/min — ABNORMAL LOW (ref >60)
Glucose, Bld: 121 mg/dL — ABNORMAL HIGH (ref 70–99)
Potassium: 3.8 mmol/L (ref 3.5–5.1)
Sodium: 144 mmol/L (ref 135–145)

## 2020-09-13 LAB — CBC
HCT: 34.9 % — ABNORMAL LOW (ref 39.0–52.0)
Hemoglobin: 11.3 g/dL — ABNORMAL LOW (ref 13.0–17.0)
MCH: 30.5 pg (ref 26.0–34.0)
MCHC: 32.4 g/dL (ref 30.0–36.0)
MCV: 94.1 fL (ref 80.0–100.0)
Platelets: 311 10*3/uL (ref 150–400)
RBC: 3.71 MIL/uL — ABNORMAL LOW (ref 4.22–5.81)
RDW: 14 % (ref 11.5–15.5)
WBC: 11.3 10*3/uL — ABNORMAL HIGH (ref 4.0–10.5)
nRBC: 0.3 % — ABNORMAL HIGH (ref 0.0–0.2)

## 2020-09-13 LAB — GLUCOSE, CAPILLARY
Glucose-Capillary: 108 mg/dL — ABNORMAL HIGH (ref 70–99)
Glucose-Capillary: 117 mg/dL — ABNORMAL HIGH (ref 70–99)
Glucose-Capillary: 129 mg/dL — ABNORMAL HIGH (ref 70–99)
Glucose-Capillary: 132 mg/dL — ABNORMAL HIGH (ref 70–99)
Glucose-Capillary: 134 mg/dL — ABNORMAL HIGH (ref 70–99)

## 2020-09-13 LAB — COOXEMETRY PANEL
Carboxyhemoglobin: 0.9 % (ref 0.5–1.5)
Methemoglobin: 0.8 % (ref 0.0–1.5)
O2 Saturation: 68.2 %
Total hemoglobin: 11.6 g/dL — ABNORMAL LOW (ref 12.0–16.0)

## 2020-09-13 LAB — HEPARIN LEVEL (UNFRACTIONATED): Heparin Unfractionated: 0.66 IU/mL (ref 0.30–0.70)

## 2020-09-13 LAB — BRAIN NATRIURETIC PEPTIDE: B Natriuretic Peptide: 1687.7 pg/mL — ABNORMAL HIGH (ref 0.0–100.0)

## 2020-09-13 MED ORDER — POTASSIUM CHLORIDE 20 MEQ PO PACK
40.0000 meq | PACK | Freq: Two times a day (BID) | ORAL | Status: DC
  Administered 2020-09-13 – 2020-09-18 (×11): 40 meq
  Filled 2020-09-13 (×12): qty 2

## 2020-09-13 MED ORDER — DEXMEDETOMIDINE HCL IN NACL 400 MCG/100ML IV SOLN
0.4000 ug/kg/h | INTRAVENOUS | Status: DC
Start: 2020-09-13 — End: 2020-09-14
  Administered 2020-09-13 (×2): 0.4 ug/kg/h via INTRAVENOUS
  Filled 2020-09-13 (×2): qty 100

## 2020-09-13 MED ORDER — QUETIAPINE FUMARATE 25 MG PO TABS
25.0000 mg | ORAL_TABLET | Freq: Two times a day (BID) | ORAL | Status: DC
  Administered 2020-09-13 – 2020-09-21 (×17): 25 mg
  Filled 2020-09-13 (×17): qty 1

## 2020-09-13 MED ORDER — ENOXAPARIN SODIUM 40 MG/0.4ML IJ SOSY
40.0000 mg | PREFILLED_SYRINGE | INTRAMUSCULAR | Status: DC
  Administered 2020-09-14 – 2020-09-18 (×5): 40 mg via SUBCUTANEOUS
  Filled 2020-09-13 (×5): qty 0.4

## 2020-09-13 NOTE — Progress Notes (Signed)
Janalyn Shy, patient's coworker, at bedside. She stated patient does have at least one brother in [place] that he is not in contact with. Steward Drone did say she would attempt to find contact information for this brother and call back to unit with any further information. Brenda's contact number is [phone].

## 2020-09-13 NOTE — Progress Notes (Addendum)
NAME:  Jesse SharperJerry Raden, MRN:  [id number], DOB:  01/19/1951, LOS: 7 ADMISSION DATE:  09/01/2020, CONSULTATION DATE:  6/20 REFERRING MD:  Jacinto HalimGanji, CHIEF COMPLAINT:  delirium and respiratory distress    History of Present Illness:  70 yr old male admitted with anterior wall STEMI .went to cath lab emergently. Found to have lesion in LAD and high grade stenosis of the RCA and Circ. Underwent successful stent of LAD. And IAB placement. He was weaned off pressors. IABP dc'd am 6/20. Progressively more confused over course of day (had received Ambien that evening) 6/20 w/ increased WOB and agitation. PCCM consulted that afternoon for agitated delirium.   Pertinent  Medical History  HTN and HL Had not seen MD in 10-15 yrs.  Significant Hospital Events: Including procedures, antibiotic start and stop dates in addition to other pertinent events   6/19 admitted w/ STEMI. While waiting had VF arrest req'd ACLS w/ defib, amio epi; estimated 5 minutes to ROSC. Was awake and alert after ROSC obtained. Went to cath lab emergently. Found to have lesion in LAD and high grade stenosis of the RCA and Circ. Underwent successful stent of LAD. And IAB placement 6/20 off IABP. Pressors off. Progressive agitation and WOB. Intubated PCCM asked to consult  6/24 reintubated for shock, milrinone restarted    Echo 09/07/2020 > Left ventricular ejection fraction, by estimation, is 20 to 25%. The left ventricle has severely decreased function. Left ventricular diastolic parameters were normal. There is akinesis of the left ventricular, entire anterior wall, anterolateral wall and apical Segment, mildly elevated pulmonary artery systolic pressure. The estimated right ventricular systolic pressure is 33.9 mmHg. Left atrial size was mildly dilated.  Interim History / Subjective:   Remains critically ill, intubated Sedated on fentanyl drip, mild breakthrough agitation overnight Good urine output on Lasix drip Remains on  milrinone  Objective   Blood pressure 114/73, pulse (!) 116, temperature (!) 100.58 F (38.1 C), resp. rate (!) 22, height 5\' 9"  (1.753 m), weight 84.2 kg, SpO2 90 %. CVP:  [0 mmHg-27 mmHg] 3 mmHg  Vent Mode: PSV;CPAP FiO2 (%):  [40 %-60 %] 40 % Set Rate:  [22 bmp] 22 bmp Vt Set:  [490 mL] 490 mL PEEP:  [5 cmH20] 5 cmH20 Pressure Support:  [5 cmH20] 5 cmH20 Plateau Pressure:  [15 cmH20-17 cmH20] 15 cmH20   Intake/Output Summary (Last 24 hours) at 09/13/2020 1012 Last data filed at 09/13/2020 1000 Gross per 24 hour  Intake 2516.04 ml  Output 2675 ml  Net -158.96 ml    Filed Weights   09/05/2020 1552 09/10/20 0600 09/11/20 0500  Weight: 90.7 kg 90.4 kg 84.2 kg    Examination: General: adult male, intubated, sedated on fentanyl, no distress HEENT: No JVD, mild pallor, no icterus Neuro: Sedated, RASS -2, opens eyes to name but does not follow commands Chest: No accessory muscle use, bilateral scattered crackles CV: S1-S2 regular, no murmur GI: Soft, nontender GU: Condom catheter in place. Extremities: warm/dry Skin: no rashes or lesions  Labs/imaging that I havepersonally reviewed  (right click and "Reselect all SmartList Selections" daily)   Chest x-ray 6/25 independently reviewed, ET tube in position, improved interstitial edema pattern, small layering effusions  Coox 68% Labs show stable creatinine at 1.4 ,  Resolved Hospital Problem list     Assessment & Plan:   Acute metabolic encephalopathy -Initially attributed to Ambien and EtOH but 6/24 related to cardiogenic shock 6/24 Head CT negative VF arrest was brief so doubt anoxic injury  Plan Goal RASS 0 to -1 , daily W UA Still having breakthrough agitation so we will hold off extubation, since delirium was the primary reason for intubation Increase Seroquel to 25 twice daily. 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  -echo w/ severe LV dysfxn. EF 20-25% -rpt Echo 6/21 EF looks about 10% Plan Coox has normalized with restarting milrinone, mechanical support not required , off levophed Continue Lasix drip , BNP decreasing but still high Heparin gtt + brilinta per cards  AKI in setting of ATN secondary to shock state Plan Trend BMP Replace potassium in anticipation of continued diuresis  Low-grade fever with mild leukocytosis -Low procalcitonin, no clear infectious source holding off antibiotics  Hyperglycemia -Hemoglobin AIC 5.3 Plan Trend Glucose, SSI   Abnormal LFTs, improving, suspect secondary to ischemia/hypoxia  Plan Trend LFTs intermittently  Summary -getting closer to extubation again but would like delirium to improve  Best Practice (right click and "Reselect all SmartList Selections" daily)   Diet/type:Tube feeds Pain/Anxiety/Delirium protocol Yes and RASS goal: 0 to -1 VAP protocol (if indicated): Yes DVT prophylaxis: prophylactic heparin  GI prophylaxis: PPI Glucose control:  SSI Central venous access:  Yes, and it is still needed Arterial line:  N/A and Yes, and it is still needed Foley:  N/A and Yes, and it is still needed Mobility:  bed rest  PT consulted: N/A Code Status:  full code Last date of multidisciplinary goals of care discussion -he apparently has 2 brothers in [place] and [place] but we do not have their contacts  Disposition: remains critically ill, will stay in intensive care  Labs   CBC: Recent Labs  Lab 08/30/2020 1551 09/07/20 0137 09/09/20 0233 09/09/20 0511 09/10/20 0400 09/10/20 0529 09/11/20 0320 09/11/20 2010 09/11/20 2347 09/12/20 0433 09/13/20 0203  WBC 7.3   < >  13.0*  --  12.4*  --  10.7*  --   --  12.4* 11.3*  NEUTROABS 3.3  --   --   --  10.3*  --   --   --   --   --   --   HGB 13.8   < > 12.0*   < > 11.5*   < > 11.4* 11.2* 10.9* 12.0* 11.3*  HCT 38.3*   < > 35.9*   < > 34.3*   < > 32.3* 33.0* 32.0* 35.2* 34.9*  MCV 95.3   < > 92.1  --  91.7  --  92.6  --   --  92.9 94.1  PLT 361   < > 257  --  260  --  300  --   --  337 311   < > = values in this interval not displayed.     Basic Metabolic Panel: Recent Labs  Lab 09/08/20 0418 09/08/20 0430 09/09/20 0233 09/09/20 0511 09/10/20 0400 09/10/20 0529 09/11/20 0320 09/11/20 2010 09/11/20 2347 09/12/20 0433 09/12/20 2003 09/13/20 0203  NA 140   < > 138   < > 139   < > 139 141 141 142 144 144  K 3.6   < > 3.5   < >  3.5   < > 3.2* 3.2* 3.8 3.8 3.6 3.8  CL 107  --  106  --  101  --  97*  --   --  104 106 105  CO2 21*  --  27  --  30  --  26  --   --  27 29 29   GLUCOSE 156*  --  131*  --  131*  --  137*  --   --  136* 122* 121*  BUN 16  --  23  --  26*  --  35*  --   --  35* 32* 32*  CREATININE 1.36*  --  1.28*  --  1.33*  --  1.59*  --   --  1.54* 1.39* 1.46*  CALCIUM 8.0*  --  8.1*  --  8.0*  --  8.3*  --   --  8.1* 7.9* 8.1*  MG 2.0  --  2.1  --  2.1  --  2.5*  --   --   --  2.8*  --   PHOS  --   --   --   --  2.8  --  4.3  --   --   --   --   --    < > = values in this interval not displayed.    GFR: Estimated Creatinine Clearance: 48.4 mL/min (A) (by C-G formula based on SCr of 1.46 mg/dL (H)). Recent Labs  Lab 09/09/20 0233 09/10/20 0400 09/10/20 0833 09/11/20 0320 09/11/20 1700 09/12/20 0433 09/13/20 0203  PROCALCITON  --   --  0.32 0.35  --  0.29  --   WBC 13.0* 12.4*  --  10.7*  --  12.4* 11.3*  LATICACIDVEN 1.3  --  1.4  --  2.1* 1.9  --      Liver Function Tests: Recent Labs  Lab 09/07/20 0137 09/08/20 0418 09/09/20 0233 09/10/20 0400 09/12/20 2003  AST 683* 314* 161* 96* 91*  ALT 143* 111* 80* 60* 129*  ALKPHOS 85 77 86 85 93  BILITOT 0.9 1.1 1.0 1.1 0.8   PROT 5.7* 5.7* 5.4* 5.9* 6.1*  ALBUMIN 3.1* 2.8* 2.6* 2.6* 2.8*    No results for input(s): LIPASE, AMYLASE in the last 168 hours. No results for input(s): AMMONIA in the last 168 hours.  ABG    Component Value Date/Time   PHART 7.479 (H) 09/11/2020 2347   PCO2ART 39.7 09/11/2020 2347   PO2ART 213 (H) 09/11/2020 2347   HCO3 29.4 (H) 09/11/2020 2347   TCO2 31 09/11/2020 2347   ACIDBASEDEF 2.0 09/08/2020 0430   O2SAT 68.2 09/13/2020 0203      Coagulation Profile: Recent Labs  Lab 08/25/2020 1551 09/07/20 1818  INR 1.1 1.2     Cardiac Enzymes: Recent Labs  Lab 09/09/20 0233  CKTOTAL 880*  CKMB 19.6*     HbA1C: Hgb A1c MFr Bld  Date/Time Value Ref Range Status  09/07/2020 06:14 PM 5.3 4.8 - 5.6 % Final    Comment:    (NOTE)         Prediabetes: 5.7 - 6.4         Diabetes: >6.4         Glycemic control for adults with diabetes: <7.0   08/19/2020 06:59 PM 5.5 4.8 - 5.6 % Final    Comment:    (NOTE)         Prediabetes: 5.7 - 6.4         Diabetes: >6.4  Glycemic control for adults with diabetes: <7.0     CBG: Recent Labs  Lab 09/12/20 1527 09/12/20 2000 09/13/20 0037 09/13/20 0344 09/13/20 0738  GLUCAP 128* 141* 134* 117* 129*     CRITICAL CARE Performed by: Comer Locket. Aceson Labell   Total critical care time: 32 minutes  Critical care time was exclusive of separately billable procedures and treating other patients.  Critical care was necessary to treat or prevent imminent or life-threatening deterioration.  Critical care was time spent personally by me on the following activities: development of treatment plan with patient and/or surrogate as well as nursing, discussions with consultants, evaluation of patient's response to treatment, examination of patient, obtaining history from patient or surrogate, ordering and performing treatments and interventions, ordering and review of laboratory studies, ordering and review of radiographic studies, pulse  oximetry, re-evaluation of patient's condition and participation in multidisciplinary rounds.   Cyril Mourning MD. Tonny Bollman. [facility] Pager : [phone]  If no response to pager , please call [phone] until 7 pm After 7:00 pm call Elink  [phone]   09/13/2020

## 2020-09-13 NOTE — Progress Notes (Addendum)
Subjective:  Remains intubated, sedated  Objective:  Vital Signs in the last 24 hours: Temp:  [99.5 F (37.5 C)-101.1 F (38.4 C)] 100.04 F (37.8 C) (06/26 0819) Pulse Rate:  [71-95] 82 (06/26 0819) Resp:  [18-25] 19 (06/26 0819) BP: (93-125)/(47-80) 114/69 (06/26 0815) SpO2:  [91 %-100 %] 98 % (06/26 0819) FiO2 (%):  [40 %-60 %] 40 % (06/26 0819)  Intake/Output from previous day: 06/25 0701 - 06/26 0700 In: 2511.4 [I.V.:1542.8; NG/GT:968.7] Out: 2875 [Urine:2875]  Physical Exam Vitals and nursing note reviewed.  Constitutional:      General: He is not in acute distress.    Appearance: He is well-developed.  HENT:     Head: Normocephalic and atraumatic.  Eyes:     Conjunctiva/sclera: Conjunctivae normal.     Pupils: Pupils are equal, round, and reactive to light.  Neck:     Vascular: No JVD.  Cardiovascular:     Rate and Rhythm: Normal rate and regular rhythm.     Pulses: Normal pulses and intact distal pulses.     Heart sounds: No murmur heard. Pulmonary:     Effort: Pulmonary effort is normal.     Breath sounds: Normal breath sounds. No wheezing or rales.  Abdominal:     General: Bowel sounds are normal.     Palpations: Abdomen is soft.     Tenderness: There is no rebound.  Musculoskeletal:        General: No tenderness. Normal range of motion.     Right lower leg: No edema.     Left lower leg: No edema.  Lymphadenopathy:     Cervical: No cervical adenopathy.  Skin:    General: Skin is warm and dry.  Neurological:     Comments: Intubated, sedated     Lab Results: BMP Recent Labs    09/12/20 0433 09/12/20 2003 09/13/20 0203  NA 142 144 144  K 3.8 3.6 3.8  CL 104 106 105  CO2 27 29 29   GLUCOSE 136* 122* 121*  BUN 35* 32* 32*  CREATININE 1.54* 1.39* 1.46*  CALCIUM 8.1* 7.9* 8.1*  GFRNONAA 49* 55* 52*     CBC Recent Labs  Lab 09/10/20 0400 09/10/20 0529 09/13/20 0203  WBC 12.4*   < > 11.3*  RBC 3.74*   < > 3.71*  HGB 11.5*   < > 11.3*   HCT 34.3*   < > 34.9*  PLT 260   < > 311  MCV 91.7   < > 94.1  MCH 30.7   < > 30.5  MCHC 33.5   < > 32.4  RDW 13.9   < > 14.0  LYMPHSABS 0.9  --   --   MONOABS 1.1*  --   --   EOSABS 0.0  --   --   BASOSABS 0.0  --   --    < > = values in this interval not displayed.     HEMOGLOBIN A1C Lab Results  Component Value Date   HGBA1C 5.3 09/07/2020   MPG 105 09/07/2020    Cardiac Panel (last 3 results) Recent Labs    09/09/20 0233  CKTOTAL 880*  CKMB 19.6*  RELINDX 2.2     BNP (last 3 results) Recent Labs    09/11/20 0320 09/12/20 0433 09/13/20 0203  BNP 2,312.1* 2,242.1* 1,687.7*     TSH Recent Labs    2020-09-18 1859  TSH 2.235     Lipid Panel     Component Value Date/Time  CHOL 182 02-Oct-2020 1551   TRIG 225 (H) 10/02/2020 1551   HDL 31 (L) 10-02-20 1551   CHOLHDL 5.9 10/02/20 1551   VLDL 45 (H) 10-02-2020 1551   LDLCALC 106 (H) Oct 02, 2020 1551     Hepatic Function Panel Recent Labs    09/09/20 0233 09/10/20 0400 09/12/20 2003  PROT 5.4* 5.9* 6.1*  ALBUMIN 2.6* 2.6* 2.8*  AST 161* 96* 91*  ALT 80* 60* 129*  ALKPHOS 86 85 93  BILITOT 1.0 1.1 0.8  BILIDIR  --  0.2  --   IBILI  --  0.9  --      Imaging: CXR 09/12/2020: IMPRESSION: 1. Improved interstitial edema. 2. Persistent, left greater than right, lung base opacities consistent with a combination of small effusions and atelectasis. 3. Stable well-positioned support apparatus.    Cardiac Studies:  EKG 09/11/2020: Sinus rhythm 84 bpm Anterior infarct, age indeterminate Prolonged Qtc  Echocardiogram 09/12/2020:  1. Left ventricular ejection fraction, by estimation, is <20%. The left  ventricle has severely decreased function. The left ventricle demonstrates  regional wall motion abnormalities (see scoring diagram/findings for  description). Left ventricular  diastolic function could not be evaluated. There is akinesis of the left  ventricular, entire anterior wall,  apical segment and anterolateral wall.   2. Right ventricular systolic function was not well visualized.   3. The mitral valve is normal in structure. No evidence of mitral valve  regurgitation. No evidence of mitral stenosis.   4. The aortic valve is normal in structure. Aortic valve regurgitation is  not visualized. No aortic stenosis is present.   5. The inferior vena cava is dilated in size with <50% respiratory  variability, suggesting right atrial pressure of 15 mmHg.   Conclusion(s)/Recommendation(s): The LV shows mild global hypokinesis  along with wall motion abnormality. LVEF 10-15%. Definity contrast used.  No LV mural thrombus. RV systolic function not assessed due to poor  visualization.   Left heart catheterization 2020-10-02: LV 56/15, EDP 21 mmHg.  Aorta 75/44, mean 57 mmHg.  No pressure gradient across the aortic valve.  Markedly elevated EDP. LM: Large vessel, bifurcates into circumflex and LAD. LAD: Has ulcerated subtotally occluded proximal LAD stenosis that extends to the mid segment.  There is TIMI I flow also in the D1 and D2.  Moderate-sized D1 and small sized D2.  Mild disease in the mid to distal LAD. CX: Moderate sized vessel, gives origin to a large OM1, OM 2 is very small, there is a lesion after OM 2 at 80% focal lesion.  OM 3 is moderate sized with secondary branches. RCA: Dominant.  Proximal segment 90% stenosis, tandem 30 to 40% stenosis in the midsegment followed by a high-grade 90% stenosis.  Mild disease in the distal right coronary.   Intervention: Successful 2 overlapping stent implantation with 3.0 x 24 mm followed by 3.0 x 16 mm Synergy XD stents, stenosis reduced from 99% to 0%, TIMI I improved to TIMI-3 flow.   Recommendation: Patient will need relook at the LAD stent in view of severe spasm and cardiogenic shock and need for pressor support in the cardiac catheterization lab.  Patient was started on Levophed due to low blood pressure and intra-aortic  balloon pump was inserted percutaneously prior to angioplasty.  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  115 mL contrast utilized.  Assessment & Recommendations:  70 y/o  Caucasian male with anterior STEMI (September 18, 2020) complicated by Vfib cardiac arrest (09-18-2020), cardiogenic shock, acute systolic heart failure  Cardiogenic shock: Anterior STEMI (2020/09/18) Successful PPCI prox-mid LAD Cardiogenic shock 2/2 AMI IABP 6/19, removed 6//20  Currently on milrinone 0.25 mcg/kg.min, off norepi. Maintaining MAPs >70. Lactic acid 1.9 (6/25 morning) 2.8 L UOP/24 hrs. Net -ve 5.9 L Co-ox 68% (6/26). With Tmax 101.1, monitor for any component of sepsis.  Will try to wean off norepi, followed by milrinone Do not suspect he will need mechanical support at this time.   STEMI: As above Continue DAPT, lipitor 80 mg Not on BB/ARNI/MRA due to shock   Acute systolic heart failure: EF 10-15%, down from initial EF 25-30% post MI BNP 1687 (6/26), down from 2242 (6/25) Shock improving, no acute indication for mechanical support at this time Not on BB/ARNI/MRA due to shock Continue corlanor 7.5 mg bid  Acute hypoxic respiratory failure:  2/2 pulmonary edema, now improving. Continue diuresis. Currently on lasix drip at 8 mg/hr, diuresing well  Acute encephalopathy: Possible combination of shock, respiratory failure + ? Management as per CCM Will hope to try and extubate in 24-48 hrs.  AKI/CKD: Improving. Cr 1.46 (6/26) I suspect this will continue to improve with diuresis, as long as no recurrent shock develops  Appreciate CCM input  CRITICAL CARE Performed by: Truett Mainland   Total critical care time: 35 minutes   Critical care time was exclusive of separately billable procedures and treating other patients.   Critical care was necessary to treat or prevent imminent  or life-threatening deterioration.   Critical care was time spent personally by me on the following activities: development of treatment plan with patient and/or surrogate as well as nursing, discussions with consultants, evaluation of patient's response to treatment, examination of patient, obtaining history from patient or surrogate, ordering and performing treatments and interventions, ordering and review of laboratory studies, ordering and review of radiographic studies, pulse oximetry and re-evaluation of patient's condition.       Elder Negus, MD Pager: [phone] Office: [phone]

## 2020-09-13 NOTE — Progress Notes (Signed)
Pt placed back on full vent support due to increase agitation and WOB. RN aware.

## 2020-09-13 NOTE — Progress Notes (Signed)
Pt placed on PSV 5/5 per wean protocol. Pt is tolerating well at this time. RN aware. RT to continue to monitor. 

## 2020-09-14 ENCOUNTER — Inpatient Hospital Stay (HOSPITAL_COMMUNITY): Payer: Medicare Other

## 2020-09-14 LAB — BASIC METABOLIC PANEL
Anion gap: 8 (ref 5–15)
Anion gap: 9 (ref 5–15)
BUN: 39 mg/dL — ABNORMAL HIGH (ref 8–23)
BUN: 33 mg/dL — ABNORMAL HIGH (ref 8–23)
CO2: 30 mmol/L (ref 22–32)
CO2: 27 mmol/L (ref 22–32)
Calcium: 8.2 mg/dL — ABNORMAL LOW (ref 8.9–10.3)
Calcium: 8.2 mg/dL — ABNORMAL LOW (ref 8.9–10.3)
Chloride: 109 mmol/L (ref 98–111)
Chloride: 112 mmol/L — ABNORMAL HIGH (ref 98–111)
Creatinine, Ser: 1.61 mg/dL — ABNORMAL HIGH (ref 0.61–1.24)
Creatinine, Ser: 1.47 mg/dL — ABNORMAL HIGH (ref 0.61–1.24)
GFR, Estimated: 46 mL/min — ABNORMAL LOW (ref >60)
GFR, Estimated: 52 mL/min — ABNORMAL LOW (ref >60)
Glucose, Bld: 161 mg/dL — ABNORMAL HIGH (ref 70–99)
Glucose, Bld: 117 mg/dL — ABNORMAL HIGH (ref 70–99)
Potassium: 3.5 mmol/L (ref 3.5–5.1)
Potassium: 3.4 mmol/L — ABNORMAL LOW (ref 3.5–5.1)
Sodium: 147 mmol/L — ABNORMAL HIGH (ref 135–145)
Sodium: 148 mmol/L — ABNORMAL HIGH (ref 135–145)

## 2020-09-14 LAB — GLUCOSE, CAPILLARY
Glucose-Capillary: 134 mg/dL — ABNORMAL HIGH (ref 70–99)
Glucose-Capillary: 127 mg/dL — ABNORMAL HIGH (ref 70–99)
Glucose-Capillary: 129 mg/dL — ABNORMAL HIGH (ref 70–99)
Glucose-Capillary: 133 mg/dL — ABNORMAL HIGH (ref 70–99)
Glucose-Capillary: 128 mg/dL — ABNORMAL HIGH (ref 70–99)
Glucose-Capillary: 155 mg/dL — ABNORMAL HIGH (ref 70–99)
Glucose-Capillary: 110 mg/dL — ABNORMAL HIGH (ref 70–99)
Glucose-Capillary: 102 mg/dL — ABNORMAL HIGH (ref 70–99)

## 2020-09-14 LAB — CBC
HCT: 33.6 % — ABNORMAL LOW (ref 39.0–52.0)
Hemoglobin: 10.8 g/dL — ABNORMAL LOW (ref 13.0–17.0)
MCH: 30.8 pg (ref 26.0–34.0)
MCHC: 32.1 g/dL (ref 30.0–36.0)
MCV: 95.7 fL (ref 80.0–100.0)
Platelets: 336 10*3/uL (ref 150–400)
RBC: 3.51 MIL/uL — ABNORMAL LOW (ref 4.22–5.81)
RDW: 14.2 % (ref 11.5–15.5)
WBC: 12.1 10*3/uL — ABNORMAL HIGH (ref 4.0–10.5)
nRBC: 0 % (ref 0.0–0.2)

## 2020-09-14 LAB — HEPARIN LEVEL (UNFRACTIONATED): Heparin Unfractionated: <0.1 IU/mL — ABNORMAL LOW (ref 0.30–0.70)

## 2020-09-14 LAB — EXPECTORATED SPUTUM ASSESSMENT W GRAM STAIN, RFLX TO RESP C

## 2020-09-14 LAB — COOXEMETRY PANEL
Carboxyhemoglobin: 1 % (ref 0.5–1.5)
Methemoglobin: 0.8 % (ref 0.0–1.5)
O2 Saturation: 69 %
Total hemoglobin: 10.6 g/dL — ABNORMAL LOW (ref 12.0–16.0)

## 2020-09-14 MED ORDER — THIAMINE HCL 100 MG/ML IJ SOLN
100.0000 mg | Freq: Every day | INTRAMUSCULAR | Status: DC
  Administered 2020-09-14 – 2020-09-22 (×9): 100 mg via INTRAVENOUS
  Filled 2020-09-14 (×9): qty 2

## 2020-09-14 MED ORDER — METOCLOPRAMIDE HCL 10 MG PO TABS
10.0000 mg | ORAL_TABLET | Freq: Two times a day (BID) | ORAL | Status: DC
  Administered 2020-09-14 – 2020-09-18 (×10): 10 mg
  Filled 2020-09-14 (×11): qty 1

## 2020-09-14 MED ORDER — LORAZEPAM 2 MG/ML IJ SOLN
INTRAMUSCULAR | Status: AC
  Administered 2020-09-14: 2 mg via INTRAVENOUS
  Filled 2020-09-14: qty 1

## 2020-09-14 MED ORDER — PHENOBARBITAL SODIUM 65 MG/ML IJ SOLN: 32.5000 mg | Freq: Three times a day (TID) | INTRAMUSCULAR | Status: DC

## 2020-09-14 MED ORDER — LORAZEPAM 2 MG/ML IJ SOLN
0.0000 mg | INTRAMUSCULAR | Status: DC
Start: 2020-09-14 — End: 2020-09-16
  Administered 2020-09-14: 4 mg via INTRAVENOUS
  Administered 2020-09-15 – 2020-09-16 (×3): 2 mg via INTRAVENOUS
  Filled 2020-09-14 (×2): qty 1
  Filled 2020-09-14: qty 2
  Filled 2020-09-14 (×2): qty 1

## 2020-09-14 MED ORDER — PHENOBARBITAL SODIUM 65 MG/ML IJ SOLN: 65.0000 mg | Freq: Three times a day (TID) | INTRAMUSCULAR | Status: DC

## 2020-09-14 MED ORDER — POTASSIUM CHLORIDE 20 MEQ PO PACK: 20.0000 meq | PACK | Freq: Once | ORAL | Status: DC

## 2020-09-14 MED ORDER — BISACODYL 10 MG RE SUPP
10.0000 mg | Freq: Every day | RECTAL | Status: DC | PRN
  Administered 2020-09-14: 10 mg via RECTAL
  Filled 2020-09-14: qty 1

## 2020-09-14 MED ORDER — LORAZEPAM 2 MG/ML IJ SOLN: 0.0000 mg | Freq: Three times a day (TID) | INTRAMUSCULAR | Status: DC

## 2020-09-14 MED ORDER — PHENOBARBITAL SODIUM 65 MG/ML IJ SOLN: 130.0000 mg | Freq: Three times a day (TID) | INTRAMUSCULAR | Status: DC

## 2020-09-14 MED ORDER — SODIUM CHLORIDE 0.9 % IV SOLN
2.0000 g | Freq: Two times a day (BID) | INTRAVENOUS | Status: DC
  Administered 2020-09-14 – 2020-09-16 (×6): 2 g via INTRAVENOUS
  Filled 2020-09-14 (×6): qty 2

## 2020-09-14 MED ORDER — METOCLOPRAMIDE HCL 10 MG PO TABS: 10.0000 mg | ORAL_TABLET | Freq: Two times a day (BID) | ORAL | Status: DC

## 2020-09-14 MED ORDER — LORAZEPAM 2 MG/ML IJ SOLN
2.0000 mg | Freq: Once | INTRAMUSCULAR | Status: AC
Start: 2020-09-14 — End: 2020-09-14

## 2020-09-14 MED ORDER — LORAZEPAM 1 MG PO TABS: 1.0000 mg | ORAL_TABLET | ORAL | Status: DC | PRN

## 2020-09-14 MED ORDER — LORAZEPAM 2 MG/ML IJ SOLN
1.0000 mg | INTRAMUSCULAR | Status: DC | PRN
Start: 2020-09-14 — End: 2020-09-17

## 2020-09-14 MED ORDER — VANCOMYCIN HCL 1500 MG/300ML IV SOLN
1500.0000 mg | INTRAVENOUS | Status: DC
  Administered 2020-09-14 – 2020-09-16 (×3): 1500 mg via INTRAVENOUS
  Filled 2020-09-14 (×5): qty 300

## 2020-09-14 NOTE — Progress Notes (Signed)
Patient became very agitated, trying to sit up in the bed, and raising legs to chest. ETT with copious secretions and secretions coming out of mouth as well. Patient with very labored breathing. Took 4 people to hold patient down in order for him not to hurt himself or others.  Patient re-sedated; 100 mcg of fentanyl bolus given and rate increased back to 200 mcg/hr. Patient airway and oral pharynx suctioned. Respiratory called. Patient placed back on full support on vent. MD came to bedside. Orders for 100 mcg of fentanyl bolus and 2-4 mg of ativan.  RN gave 100 mcg of fentanyl from IV infusion and gave 2 mg of ativan.   O2 sats OK and breathing improved after re-sedation. CIWA protocol added per MD orders, See patient MAR and chart.   RN will continue to monitor patient closely.

## 2020-09-14 NOTE — Progress Notes (Signed)
Pharmacy Antibiotic Note  Jesse Lowe is a 70 y.o. male admitted on 09/13/2020 with pneumonia.  Pharmacy has been consulted for vancomycin and cefepime dosing.  Plan: Cefepime 2g IV q 12 hrs. Vancomycin 1500 mg IV q 24 hrs. F/u cultures, renal function and clinical course.  Height: 5\' 9"  (175.3 cm) Weight: 84.2 kg (185 lb 10 oz) IBW/kg (Calculated) : 70.7  Temp (24hrs), Avg:99.9 F (37.7 C), Min:99.1 F (37.3 C), Max:100.58 F (38.1 C)  Recent Labs  Lab 09/08/20 0154 09/08/20 0418 09/09/20 0233 09/10/20 0400 09/10/20 0833 09/11/20 0320 09/11/20 1700 09/12/20 0433 09/12/20 2003 09/13/20 0203 09/14/20 0404 09/14/20 0412  WBC  --    < > 13.0* 12.4*  --  10.7*  --  12.4*  --  11.3* 12.1*  --   CREATININE  --    < > 1.28* 1.33*  --  1.59*  --  1.54* 1.39* 1.46*  --  1.61*  LATICACIDVEN 3.4*  --  1.3  --  1.4  --  2.1* 1.9  --   --   --   --    < > = values in this interval not displayed.    Estimated Creatinine Clearance: 43.9 mL/min (A) (by C-G formula based on SCr of 1.61 mg/dL (H)).    No Known Allergies  Antimicrobials this admission: Vancomycin 6/27 > Cefepime 6/27 >  Microbiology results: 7/27 6/23 ngtd  Thank you for allowing pharmacy to be a part of this patient's care.   7/23, Reece Leader, BCCP Clinical Pharmacist  09/14/2020 11:35 AM   [facility] pharmacy phone numbers are listed on [email or web address]

## 2020-09-14 NOTE — Progress Notes (Addendum)
NAME:  Jesse Lowe, MRN:  [id number], DOB:  08-07-1950, LOS: 8 ADMISSION DATE:  08/27/2020, CONSULTATION DATE:  6/20 REFERRING MD:  Jacinto Halim, CHIEF COMPLAINT:  delirium and respiratory distress    History of Present Illness:  70 yr old male admitted with anterior wall STEMI .went to cath lab emergently. Found to have lesion in LAD and high grade stenosis of the RCA and Circ. Underwent successful stent of LAD. And IAB placement. He was weaned off pressors. IABP dc'd am 6/20. Progressively more confused over course of day (had received Ambien that evening) 6/20 w/ increased WOB and agitation. PCCM consulted that afternoon for agitated delirium.   Pertinent  Medical History  HTN and HL Had not seen MD in 10-15 yrs.  Significant Hospital Events: Including procedures, antibiotic start and stop dates in addition to other pertinent events   6/19 admitted w/ STEMI. While waiting had VF arrest req'd ACLS w/ defib, amio epi; estimated 5 minutes to ROSC. Was awake and alert after ROSC obtained. Went to cath lab emergently. Found to have lesion in LAD and high grade stenosis of the RCA and Circ. Underwent successful stent of LAD. And IAB placement 6/20 off IABP. Pressors off. Progressive agitation and WOB. Intubated PCCM asked to consult  6/24 reintubated for shock, milrinone restarted    Echo 09/07/2020 > Left ventricular ejection fraction, by estimation, is 20 to 25%. The left ventricle has severely decreased function. Left ventricular diastolic parameters were normal. There is akinesis of the left ventricular, entire anterior wall, anterolateral wall and apical Segment, mildly elevated pulmonary artery systolic pressure. The estimated right ventricular systolic pressure is 33.9 mmHg. Left atrial size was mildly dilated.  Interim History / Subjective:  Remains on Fentanyl, 0.25 Milrinone, 6 Levo. SBP 110. Follows basic commands.  Objective   Blood pressure 104/61, pulse (!) 103, temperature 100.04 F  (37.8 C), resp. rate (!) 21, height 5\' 9"  (1.753 m), weight 84.2 kg, SpO2 99 %. CVP:  [3 mmHg-27 mmHg] 8 mmHg  Vent Mode: PRVC FiO2 (%):  [40 %] 40 % Set Rate:  [22 bmp] 22 bmp Vt Set:  [490 mL] 490 mL PEEP:  [5 cmH20] 5 cmH20 Plateau Pressure:  [15 cmH20-17 cmH20] 16 cmH20   Intake/Output Summary (Last 24 hours) at 09/14/2020 0846 Last data filed at 09/14/2020 0600 Gross per 24 hour  Intake 2357.17 ml  Output 2100 ml  Net 257.17 ml    Filed Weights   09/07/2020 1552 09/10/20 0600 09/11/20 0500  Weight: 90.7 kg 90.4 kg 84.2 kg    Examination: General: Adult male, resting in bed, in NAD. Neuro: Sedated but opens eyes to voice and follows basic intermittent commands. HEENT: [place]/AT. Sclerae anicteric. ETT in place. Cardiovascular: RRR, no M/R/G.  Lungs: Respirations even and unlabored.  CTA bilaterally, No W/R/R. Abdomen: BS x 4, soft, NT/ND.  Musculoskeletal: No gross deformities, no edema.  Skin: Intact, warm, no rashes.   Assessment & Plan:   Acute metabolic encephalopathy - Initially attributed to Ambien and EtOH but 6/24 related to cardiogenic shock 6/24 Head CT negative VF arrest was brief so doubt anoxic injury Plan Goal RASS 0 to -1 , daily WUA Still having breakthrough agitation so we will hold off extubation, since delirium was the primary reason for intubation Continue Seroquel to 25 twice daily, Thiamine, Folic Acid  Acute hypoxic respiratory failure w/ progressive pulmonary pulm edema  6/20 ETT >> 6/22 , 6/24 >> Plan Continue weaning trials as sedation is decreased Diuresis per  cards Send trach aspirate given possible emesis with tube feed material like substance noted in ETT  Acute systolic HF w/ EF 20-25%; STEMI s/p stent to LAD, 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  -echo w/ severe LV dysfxn. EF 20-25% -rpt Echo 6/21 EF looks about 10% -Coox has normalized with restarting milrinone, mechanical support not  required , off levophed Plan Continue diuresis as able, SCr bumped today so cards has held Lovenox + brilinta per cards  AKI in setting of ATN secondary to shock state Plan Trend BMP  Low-grade fever with mild leukocytosis Plan Low procalcitonin, no clear infectious source holding off antibiotics Hold tube feeds for now given some concern for emesis, get trach aspirate Add reglan  Constipation Plan Continue Miralax, Colace Add Dulcolax suppository, Reglna  Hyperglycemia -Hemoglobin AIC 5.3 Plan Trend Glucose, SSI   Abnormal LFTs, improving, suspect secondary to ischemia/hypoxia  Plan Trend LFTs intermittently  Summary - getting closer to extubation again but would like delirium to improve  Best Practice (right click and "Reselect all SmartList Selections" daily)   Diet/type:Tube feeds on hold for today given some concern for emesis.  CXR looks ok Pain/Anxiety/Delirium protocol Yes and RASS goal: 0 to -1 VAP protocol (if indicated): Yes DVT prophylaxis: LMWH GI prophylaxis: PPI Glucose control:  SSI Central venous access:  Yes, and it is still needed Arterial line:  N/A Foley:  N/A and Yes, and it is still needed Mobility:  bed rest  PT consulted: N/A Code Status:  full code Last date of multidisciplinary goals of care discussion -he apparently has 2 brothers in [place] and [place] but we do not have their contacts.  A coworker is working on Health and safety inspector for family in [place] (Coworker Steward Drone can be reached on [phone]) Disposition: remains critically ill, will stay in intensive care   CC time: 30 min.   Rutherford Guys, PA - [facility] For pager details, please see AMION or use Epic chat  After 1900, please call [facility] for cross coverage needs 09/14/2020, 9:06 AM'

## 2020-09-14 NOTE — Plan of Care (Signed)
  Problem: Education: Goal: Knowledge of General Education information will improve Description: Including pain rating scale, medication(s)/side effects and non-pharmacologic comfort measures Outcome: Progressing   Problem: Health Behavior/Discharge Planning: Goal: Ability to manage health-related needs will improve Outcome: Progressing   Problem: Clinical Measurements: Goal: Ability to maintain clinical measurements within normal limits will improve Outcome: Progressing Goal: Will remain free from infection Outcome: Progressing Goal: Diagnostic test results will improve Outcome: Progressing Goal: Respiratory complications will improve Outcome: Progressing Goal: Cardiovascular complication will be avoided Outcome: Progressing   Problem: Activity: Goal: Risk for activity intolerance will decrease Outcome: Progressing   Problem: Nutrition: Goal: Adequate nutrition will be maintained Outcome: Progressing   Problem: Coping: Goal: Level of anxiety will decrease Outcome: Progressing   Problem: Elimination: Goal: Will not experience complications related to bowel motility Outcome: Progressing Goal: Will not experience complications related to urinary retention Outcome: Progressing   Problem: Pain Managment: Goal: General experience of comfort will improve Outcome: Progressing   Problem: Safety: Goal: Ability to remain free from injury will improve Outcome: Progressing   Problem: Skin Integrity: Goal: Risk for impaired skin integrity will decrease Outcome: Progressing   Problem: Safety: Goal: Non-violent Restraint(s) Outcome: Progressing   Problem: Education: Goal: Ability to demonstrate management of disease process will improve Outcome: Progressing Goal: Ability to verbalize understanding of medication therapies will improve Outcome: Progressing Goal: Individualized Educational Video(s) Outcome: Progressing   Problem: Activity: Goal: Capacity to carry out  activities will improve Outcome: Progressing   Problem: Cardiac: Goal: Ability to achieve and maintain adequate cardiopulmonary perfusion will improve Outcome: Progressing   Problem: Activity: Goal: Ability to tolerate increased activity will improve Outcome: Progressing   Problem: Respiratory: Goal: Ability to maintain a clear airway and adequate ventilation will improve Outcome: Progressing   Problem: Role Relationship: Goal: Method of communication will improve Outcome: Progressing

## 2020-09-14 NOTE — Progress Notes (Addendum)
Saw agitation real time with sedation wean. This is more c/w a withdraw syndrome rather than low output state. His substance abuse history is unknown. Going to start a benzo taper.  Myrla Halsted MD PCCM

## 2020-09-14 NOTE — Progress Notes (Signed)
Pt placed on PSV 8/5 per wean protocol. Pt is tolerating well at this time. RN aware. RT to continue to monitor.

## 2020-09-14 NOTE — Progress Notes (Signed)
Subjective:  Remains intubated, sedated  Did not tolerate Precedex due to bradycardia Continues to have severe agitation without adequate sedation  Objective:  Vital Signs in the last 24 hours: Temp:  [99.1 F (37.3 C)-100.58 F (38.1 C)] 99.7 F (37.6 C) (06/27 0600) Pulse Rate:  [29-116] 73 (06/27 0600) Resp:  [16-31] 22 (06/27 0600) BP: (74-120)/(46-93) 114/63 (06/27 0545) SpO2:  [84 %-100 %] 100 % (06/27 0600) FiO2 (%):  [40 %] 40 % (06/27 0200)  Intake/Output from previous day: 06/26 0701 - 06/27 0700 In: 2507.8 [I.V.:1317.8; NG/GT:1190] Out: 2350 [Urine:2350]  Physical Exam Vitals and nursing note reviewed.  Constitutional:      General: He is not in acute distress.    Appearance: He is well-developed.  HENT:     Head: Normocephalic and atraumatic.  Eyes:     Conjunctiva/sclera: Conjunctivae normal.     Pupils: Pupils are equal, round, and reactive to light.  Neck:     Vascular: No JVD.  Cardiovascular:     Rate and Rhythm: Normal rate and regular rhythm.     Pulses: Normal pulses and intact distal pulses.     Heart sounds: No murmur heard. Pulmonary:     Effort: Pulmonary effort is normal.     Breath sounds: Normal breath sounds. No wheezing or rales.  Abdominal:     General: Bowel sounds are normal.     Palpations: Abdomen is soft.     Tenderness: There is no rebound.  Musculoskeletal:        General: No tenderness. Normal range of motion.     Right lower leg: No edema.     Left lower leg: No edema.  Lymphadenopathy:     Cervical: No cervical adenopathy.  Skin:    General: Skin is warm and dry.  Neurological:     Comments: Intubated, sedated     Lab Results: BMP Recent Labs    09/12/20 2003 09/13/20 0203 09/14/20 0412  NA 144 144 147*  K 3.6 3.8 3.5  CL 106 105 109  CO2 29 29 30   GLUCOSE 122* 121* 161*  BUN 32* 32* 39*  CREATININE 1.39* 1.46* 1.61*  CALCIUM 7.9* 8.1* 8.2*  GFRNONAA 55* 52* 46*     CBC Recent Labs  Lab  09/10/20 0400 09/10/20 0529 09/14/20 0404  WBC 12.4*   < > 12.1*  RBC 3.74*   < > 3.51*  HGB 11.5*   < > 10.8*  HCT 34.3*   < > 33.6*  PLT 260   < > 336  MCV 91.7   < > 95.7  MCH 30.7   < > 30.8  MCHC 33.5   < > 32.1  RDW 13.9   < > 14.2  LYMPHSABS 0.9  --   --   MONOABS 1.1*  --   --   EOSABS 0.0  --   --   BASOSABS 0.0  --   --    < > = values in this interval not displayed.     HEMOGLOBIN A1C Lab Results  Component Value Date   HGBA1C 5.3 09/07/2020   MPG 105 09/07/2020    Cardiac Panel (last 3 results) Recent Labs    09/09/20 0233  CKTOTAL 880*  CKMB 19.6*  RELINDX 2.2     BNP (last 3 results) Recent Labs    09/11/20 0320 09/12/20 0433 09/13/20 0203  BNP 2,312.1* 2,242.1* 1,687.7*     TSH Recent Labs    09/16/2020 1859  TSH  2.235     Lipid Panel     Component Value Date/Time   CHOL 182 09/30/20 1551   TRIG 225 (H) 2020-09-30 1551   HDL 31 (L) 2020/09/30 1551   CHOLHDL 5.9 09-30-2020 1551   VLDL 45 (H) 09-30-20 1551   LDLCALC 106 (H) 30-Sep-2020 1551     Hepatic Function Panel Recent Labs    09/09/20 0233 09/10/20 0400 09/12/20 2003  PROT 5.4* 5.9* 6.1*  ALBUMIN 2.6* 2.6* 2.8*  AST 161* 96* 91*  ALT 80* 60* 129*  ALKPHOS 86 85 93  BILITOT 1.0 1.1 0.8  BILIDIR  --  0.2  --   IBILI  --  0.9  --      Imaging: CXR 09/12/2020: IMPRESSION: 1. Improved interstitial edema. 2. Persistent, left greater than right, lung base opacities consistent with a combination of small effusions and atelectasis. 3. Stable well-positioned support apparatus.    Cardiac Studies:  EKG 09/11/2020: Sinus rhythm 84 bpm Anterior infarct, age indeterminate Prolonged Qtc  Echocardiogram 09/12/2020:  1. Left ventricular ejection fraction, by estimation, is <20%. The left  ventricle has severely decreased function. The left ventricle demonstrates  regional wall motion abnormalities (see scoring diagram/findings for  description). Left ventricular   diastolic function could not be evaluated. There is akinesis of the left  ventricular, entire anterior wall, apical segment and anterolateral wall.   2. Right ventricular systolic function was not well visualized.   3. The mitral valve is normal in structure. No evidence of mitral valve  regurgitation. No evidence of mitral stenosis.   4. The aortic valve is normal in structure. Aortic valve regurgitation is  not visualized. No aortic stenosis is present.   5. The inferior vena cava is dilated in size with <50% respiratory  variability, suggesting right atrial pressure of 15 mmHg.   Conclusion(s)/Recommendation(s): The LV shows mild global hypokinesis  along with wall motion abnormality. LVEF 10-15%. Definity contrast used.  No LV mural thrombus. RV systolic function not assessed due to poor  visualization.   Left heart catheterization September 30, 2020: LV 56/15, EDP 21 mmHg.  Aorta 75/44, mean 57 mmHg.  No pressure gradient across the aortic valve.  Markedly elevated EDP. LM: Large vessel, bifurcates into circumflex and LAD. LAD: Has ulcerated subtotally occluded proximal LAD stenosis that extends to the mid segment.  There is TIMI I flow also in the D1 and D2.  Moderate-sized D1 and small sized D2.  Mild disease in the mid to distal LAD. CX: Moderate sized vessel, gives origin to a large OM1, OM 2 is very small, there is a lesion after OM 2 at 80% focal lesion.  OM 3 is moderate sized with secondary branches. RCA: Dominant.  Proximal segment 90% stenosis, tandem 30 to 40% stenosis in the midsegment followed by a high-grade 90% stenosis.  Mild disease in the distal right coronary.   Intervention: Successful 2 overlapping stent implantation with 3.0 x 24 mm followed by 3.0 x 16 mm Synergy XD stents, stenosis reduced from 99% to 0%, TIMI I improved to TIMI-3 flow.   Recommendation: Patient will need relook at the LAD stent in view of severe spasm and cardiogenic shock and need for pressor support  in the cardiac catheterization lab.  Patient was started on Levophed due to low blood pressure and intra-aortic balloon pump was inserted percutaneously prior to angioplasty.  He also has high-grade stenosis in the right and circumflex coronary artery that will need PCI in a staged fashion after discharge from  the hospital when stable.  115 mL contrast utilized.  Assessment & Recommendations:  70 y/o Caucasian male with anterior STEMI (08/28/2020) complicated by Vfib cardiac arrest (08/28/2020), cardiogenic shock, acute systolic heart failure  Cardiogenic shock: Anterior STEMI (09/15/2020) Successful PPCI prox-mid LAD Cardiogenic shock 2/2 AMI IABP 6/19, removed 6//20  Currently on milrinone 0.25 mcg/kg.min, back on norepi after sedation Lactic acid 1.9 (6/25 morning) 2.3 L UOP/24 hrs. Net -ve 5.7 L Co-ox 69% (6/27). With Tmax 100.4, monitor for any component of sepsis.  Will try to reduce milrinone to 0.125 mcg/kg/min to see if it helps MAP Continue norepi for now. Do not suspect he will need mechanical support at this time.   STEMI: As above Continue DAPT, lipitor 80 mg Not on BB/ARNI/MRA due to shock   Acute systolic heart failure: EF 10-15%, down from initial EF 25-30% post MI BNP 1687 (6/26), down from 2242 (6/25) Shock improving, no acute indication for mechanical support at this time Not on BB/ARNI/MRA due to shock Continue corlanor 7.5 mg bid Hold lasix today. I think he is euvolumic and has AKI.  Acute hypoxic respiratory failure:  2/2 pulmonary edema, now improving. Continue diuresis. Currently on lasix drip at 8 mg/hr, diuresing well  Acute encephalopathy: Possible combination of shock, respiratory failure + ? Management as per CCM Will hope to try and extubate in 24-48 hrs.  AKI/CKD: Cr 1.61 (6/27) Hold lasix today.  Mental status and agitation is becoming major issue limiting extubation. Unlikely to be a candidate for mechanical support due to his  encephalopathy. Difficult situation. Trying to get hold of family.   Appreciate CCM input  CRITICAL CARE Performed by: Truett Mainland   Total critical care time: 35 minutes   Critical care time was exclusive of separately billable procedures and treating other patients.   Critical care was necessary to treat or prevent imminent or life-threatening deterioration.   Critical care was time spent personally by me on the following activities: development of treatment plan with patient and/or surrogate as well as nursing, discussions with consultants, evaluation of patient's response to treatment, examination of patient, obtaining history from patient or surrogate, ordering and performing treatments and interventions, ordering and review of laboratory studies, ordering and review of radiographic studies, pulse oximetry and re-evaluation of patient's condition.       Elder Negus, MD Pager: [phone] Office: [phone]

## 2020-09-15 ENCOUNTER — Inpatient Hospital Stay (HOSPITAL_COMMUNITY): Payer: Medicare Other

## 2020-09-15 DIAGNOSIS — N171 Acute kidney failure with acute cortical necrosis: Secondary | ICD-10-CM

## 2020-09-15 DIAGNOSIS — I509 Heart failure, unspecified: Secondary | ICD-10-CM

## 2020-09-15 LAB — CULTURE, BLOOD (ROUTINE X 2)
Culture: NO GROWTH
Culture: NO GROWTH
Special Requests: ADEQUATE

## 2020-09-15 LAB — COOXEMETRY PANEL
Carboxyhemoglobin: 1 % (ref 0.5–1.5)
Methemoglobin: 1 % (ref 0.0–1.5)
O2 Saturation: 72.6 %
Total hemoglobin: 10.7 g/dL — ABNORMAL LOW (ref 12.0–16.0)

## 2020-09-15 LAB — CBC
HCT: 33 % — ABNORMAL LOW (ref 39.0–52.0)
Hemoglobin: 10.2 g/dL — ABNORMAL LOW (ref 13.0–17.0)
MCH: 29.6 pg (ref 26.0–34.0)
MCHC: 30.9 g/dL (ref 30.0–36.0)
MCV: 95.7 fL (ref 80.0–100.0)
Platelets: 371 10*3/uL (ref 150–400)
RBC: 3.45 MIL/uL — ABNORMAL LOW (ref 4.22–5.81)
RDW: 14.3 % (ref 11.5–15.5)
WBC: 11 10*3/uL — ABNORMAL HIGH (ref 4.0–10.5)
nRBC: 0.2 % (ref 0.0–0.2)

## 2020-09-15 LAB — BASIC METABOLIC PANEL
Anion gap: 8 (ref 5–15)
BUN: 33 mg/dL — ABNORMAL HIGH (ref 8–23)
CO2: 26 mmol/L (ref 22–32)
Calcium: 8.3 mg/dL — ABNORMAL LOW (ref 8.9–10.3)
Chloride: 113 mmol/L — ABNORMAL HIGH (ref 98–111)
Creatinine, Ser: 1.38 mg/dL — ABNORMAL HIGH (ref 0.61–1.24)
GFR, Estimated: 56 mL/min — ABNORMAL LOW (ref >60)
Glucose, Bld: 135 mg/dL — ABNORMAL HIGH (ref 70–99)
Potassium: 3.5 mmol/L (ref 3.5–5.1)
Sodium: 147 mmol/L — ABNORMAL HIGH (ref 135–145)

## 2020-09-15 LAB — GLUCOSE, CAPILLARY
Glucose-Capillary: 107 mg/dL — ABNORMAL HIGH (ref 70–99)
Glucose-Capillary: 112 mg/dL — ABNORMAL HIGH (ref 70–99)
Glucose-Capillary: 121 mg/dL — ABNORMAL HIGH (ref 70–99)
Glucose-Capillary: 126 mg/dL — ABNORMAL HIGH (ref 70–99)
Glucose-Capillary: 55 mg/dL — ABNORMAL LOW (ref 70–99)
Glucose-Capillary: 59 mg/dL — ABNORMAL LOW (ref 70–99)

## 2020-09-15 MED ORDER — PROSOURCE TF PO LIQD
45.0000 mL | Freq: Two times a day (BID) | ORAL | Status: DC
  Administered 2020-09-15 – 2020-09-22 (×15): 45 mL
  Filled 2020-09-15 (×15): qty 45

## 2020-09-15 MED ORDER — POTASSIUM CHLORIDE 20 MEQ PO PACK
40.0000 meq | PACK | Freq: Once | ORAL | Status: AC
  Administered 2020-09-15: 40 meq
  Filled 2020-09-15: qty 2

## 2020-09-15 MED ORDER — DEXTROSE 10 % IV SOLN: INTRAVENOUS | Status: DC

## 2020-09-15 MED ORDER — SODIUM CHLORIDE 0.9% FLUSH: 10.0000 mL | INTRAVENOUS | Status: DC | PRN

## 2020-09-15 MED ORDER — CHLORHEXIDINE GLUCONATE 0.12 % MT SOLN
OROMUCOSAL | Status: AC
  Administered 2020-09-15: 15 mL
  Filled 2020-09-15: qty 15

## 2020-09-15 MED ORDER — VITAL 1.5 CAL PO LIQD
1000.0000 mL | ORAL | Status: DC
  Administered 2020-09-15 – 2020-09-22 (×7): 1000 mL
  Filled 2020-09-15: qty 1000

## 2020-09-15 MED ORDER — SODIUM CHLORIDE 0.9% FLUSH
10.0000 mL | Freq: Two times a day (BID) | INTRAVENOUS | Status: DC
  Administered 2020-09-15 – 2020-09-18 (×6): 10 mL
  Administered 2020-09-18: 40 mL
  Administered 2020-09-19 – 2020-09-22 (×6): 10 mL

## 2020-09-15 NOTE — Progress Notes (Signed)
Trelyn Vanderlinde, brother, [phone] PhD in history Brother also has CAD hx No history of drugs, alcohol that he knows of No history of psychiatric disorder that he knows of  Notably they are estranged and have been for some time.  He remains default POA.  Updated him as best I could and told him I would reach out if any changes in clinical condition.  Myrla Halsted MD PCCM

## 2020-09-15 NOTE — Progress Notes (Signed)
Nutrition Follow-up  DOCUMENTATION CODES:   Non-severe (moderate) malnutrition in context of chronic illness  INTERVENTION:   No BM x 4 days, consider suppository if no results in 24-48 hours   Plan Cortrak placement tomorrow  Tube feeding:  -Vital 1.5 @ 20 ml/hr via OG -Increase by 10 ml Q8 hours to goal rate of 60 ml/hr (1440 ml) -ProSource Tf 45 ml BID  At goal rate TF provides: 2240 kcals, 127 grams protein, 1100 ml free water.   NUTRITION DIAGNOSIS:   Moderate Malnutrition related to chronic illness as evidenced by mild muscle depletion, mild fat depletion.  Ongoing  GOAL:   Patient will meet greater than or equal to 90% of their needs  Addressed via TF  MONITOR:   Vent status, TF tolerance, Labs, Weight trends  REASON FOR ASSESSMENT:   Consult Enteral/tube feeding initiation and management  ASSESSMENT:   70 yo admitted with chest pain, Code STEMI activated. While waiting i, pt had VF arrest, taking to cath lab and underwent stent to LAD and IABP, intubated. PMH includes HNT, HLD; pt had not seen MD in 10-15 years  6/19 Admitted with STEMI, VF arrest, Cath Lab with stent to LAD and IABP 6/20 IABP removed 6/22 Self extubated 6/24 re-intubated 2/2 cardiogenic shock  Pt discussed during ICU rounds and with RN.   Remains agitated. High secretion burden, may require trach. Now hypernatremic, consider addition of free water flushes. On D10 due to tube feedings being held for possible emesis. Suspect high secretion burden playing a role. Okay to restart TF via CCM. Plan Cortrak placement tomorrow.   Admission weight: 90.4 kg  Current weight: 84.4 kg   UOP: 1480 ml x 24 hrs   Drips: levophed Medications: colace, folic acid, SS novolog, 10 mg reglan BID, miralax, 40 mEq Kcl BID, 100 mg thiamine daily,  Labs: Na 147 (H) Cr 1.38- down from yesterday   Diet Order:   Diet Order             Diet NPO time specified  Diet effective now                   EDUCATION NEEDS:  Not appropriate for education at this time  Skin:  Skin Assessment: Reviewed RN Assessment  Last BM:  6/24  Height:  Ht Readings from Last 1 Encounters:  09-15-20 5\' 9"  (1.753 m)   Weight:  Wt Readings from Last 1 Encounters:  09/15/20 84.4 kg   BMI:  Body mass index is 27.48 kg/m.  Estimated Nutritional Needs:   Kcal:  2100-2300 kcal  Protein:  115-130 grams  Fluid:  >/= 2 L/day  09/17/20 MS, RD, LDN, CNSC Clinical Nutrition Pager listed in AMION

## 2020-09-15 NOTE — Progress Notes (Signed)
eLink Physician-Brief Progress Note Patient Name: Jesse Lowe DOB: 1951/02/23 MRN: [id number]   Date of Service  09/15/2020  HPI/Events of Note  Hypokalemia - K+ = 3.5 and Creatinine = 1.38.   eICU Interventions  Will replace K+.     Intervention Category Major Interventions: Electrolyte abnormality - evaluation and management  [name] 09/15/2020, 5:11 AM

## 2020-09-15 NOTE — Progress Notes (Signed)
eLink Physician-Brief Progress Note Patient Name: Jesse Lowe DOB: [birth year] MRN: [id number]   Date of Service  09/15/2020  HPI/Events of Note  Hypoglycemia - Blood glucose = 55. Already given D50.  eICU Interventions  Plan: D10W IV infusion at 40 mL/hour.     Intervention Category Major Interventions: Other:  Lenell Antu 09/15/2020, 12:41 AM

## 2020-09-15 NOTE — Procedures (Signed)
Bronchoscopy Procedure Note  Alecxis Baltzell  [id number]  1950-12-29  Date:09/15/20  Time:11:44 AM   Provider Performing:Kannen Moxey C Tamala Julian   Procedure(s):  Flexible bronchoscopy with bronchial alveolar lavage [phone])  Indication(s) High secretion burden, desats, hemoptysis, eval for source  Consent Unable to obtain consent due to emergent nature of procedure.  Anesthesia In place for mechanical ventilation   Time Out Verified patient identification, verified procedure, site/side was marked, verified correct patient position, special equipment/implants available, medications/allergies/relevant history reviewed, required imaging and test results available.   Sterile Technique Usual hand hygiene, masks, gowns, and gloves were used   Procedure Description Bronchoscope advanced through endotracheal tube and into airway.  Airways were examined down to subsegmental level with findings noted below.   Following diagnostic evaluation, BAL(s) performed in LUL with normal saline and return of cloudy bloody fluid  Findings:  - Diffuse airway erythema with mucosal irritation/bleeding probably from suction trauma - Copious additional mucopurulent secretions  Complications/Tolerance None; patient tolerated the procedure well. Chest X-ray is not needed post procedure.   EBL Minimal   Specimen(s) LUL BAL

## 2020-09-15 NOTE — Progress Notes (Signed)
NAME:  Jesse Lowe, MRN:  [id number], DOB:  1950-04-09, LOS: 9 ADMISSION DATE:  10/04/2020, CONSULTATION DATE:  6/20 REFERRING MD:  Jacinto Halim, CHIEF COMPLAINT:  delirium and respiratory distress    History of Present Illness:  70 yr old male admitted with anterior wall STEMI .went to cath lab emergently. Found to have lesion in LAD and high grade stenosis of the RCA and Circ. Underwent successful stent of LAD. And IAB placement. He was weaned off pressors. IABP dc'd am 6/20. Progressively more confused over course of day (had received Ambien that evening) 6/20 w/ increased WOB and agitation. PCCM consulted that afternoon for agitated delirium.   Pertinent  Medical History  HTN and HL Had not seen MD in 10-15 yrs.  Significant Hospital Events: Including procedures, antibiotic start and stop dates in addition to other pertinent events   6/19 admitted w/ STEMI. While waiting had VF arrest req'd ACLS w/ defib, amio epi; estimated 5 minutes to ROSC. Was awake and alert after ROSC obtained. Went to cath lab emergently. Found to have lesion in LAD and high grade stenosis of the RCA and Circ. Underwent successful stent of LAD. And IAB placement 6/20 off IABP. Pressors off. Progressive agitation and WOB. Intubated PCCM asked to consult 6/24 reintubated for shock, milrinone restarted Echo 09/07/2020 > Left ventricular ejection fraction, by estimation, is 20 to 25%. The left ventricle has severely decreased function. Left ventricular diastolic parameters were normal. There is akinesis of the left ventricular, entire anterior wall, anterolateral wall and apical Segment, mildly elevated pulmonary artery systolic pressure. The estimated right ventricular systolic pressure is 33.9 mmHg. Left atrial size was mildly dilated.  Interim History / Subjective:  Had withdrawal yesterday during wean with significant agitation. This morning, remains sedated on full vent support.  Objective   Blood pressure 110/68, pulse  74, temperature 99.5 F (37.5 C), temperature source Bladder, resp. rate (!) 22, height 5\' 9"  (1.753 m), weight 84.4 kg, SpO2 97 %. CVP:  [4 mmHg-12 mmHg] 12 mmHg  Vent Mode: PRVC FiO2 (%):  [40 %-50 %] 40 % Set Rate:  [22 bmp] 22 bmp Vt Set:  [490 mL] 490 mL PEEP:  [5 cmH20] 5 cmH20 Pressure Support:  [8 cmH20] 8 cmH20 Plateau Pressure:  [14 cmH20-19 cmH20] 19 cmH20   Intake/Output Summary (Last 24 hours) at 09/15/2020 0836 Last data filed at 09/15/2020 0827 Gross per 24 hour  Intake 3399.75 ml  Output 1555 ml  Net 1844.75 ml    Filed Weights   09/10/20 0600 09/11/20 0500 09/15/20 0404  Weight: 90.4 kg 84.2 kg 84.4 kg    Examination: General: Adult male, resting in bed, in NAD. Neuro: Sedated, not opening eyes or following commands. HEENT: [place]/AT. Sclerae anicteric. ETT in place. Cardiovascular: RRR, no M/R/G.  Lungs: Respirations even and unlabored.  CTA bilaterally, No W/R/R. Abdomen: BS x 4, soft, NT/ND.  Musculoskeletal: No gross deformities, no edema.  Skin: Intact, warm, no rashes.   Assessment & Plan:   Acute metabolic encephalopathy - Initially attributed to Ambien and EtOH but 6/24 related to cardiogenic shock.  6/27 more c/w withdrawal type syndrome 6/24 Head CT negative VF arrest was brief so doubt anoxic injury Plan Goal RASS 0 to -1 , daily WUA Still having breakthrough agitation so we will hold off extubation, since delirium was the primary reason for intubation Continue benzo taper Continue Seroquel to 25 twice daily, Thiamine, Folic Acid  Acute hypoxic respiratory failure w/ progressive pulmonary pulm edema and possible  aspiration of tube feeds 6/27 6/20 ETT >> 6/22 , 6/24 >> Plan Continue weaning trials as sedation is decreased Diuresis per cards Continue vanc, cefepime F/u on trach aspirate given possible emesis with tube feed material like substance noted in ETT 6/27  Acute systolic HF w/ EF 20-25%; STEMI s/p stent to LAD, also has sig CAD  involving the RCA and Circ  Brief V.Fib arrest secondary to above.  -echo w/ severe LV dysfxn. EF 20-25% -rpt Echo 6/21 EF looks about 10% -Coox has normalized with restarting milrinone, mechanical support not required , off levophed Plan Continue gentle diuresis as able, held 6/27 due to SCr bump Lovenox + brilinta per cards  AKI in setting of ATN secondary to shock state Plan Trend BMP  Constipation Plan Continue Miralax, Colace Add Dulcolax suppository, Reglan  Hyperglycemia - complicated by hypoglycemia after TF held 6/27 -Hemoglobin AIC 5.3 Plan Continue D10 infusion while TF on hold Trend Glucose, SSI   Abnormal LFTs, improving, suspect secondary to ischemia/hypoxia  Plan Trend LFTs intermittently   Best Practice (right click and "Reselect all SmartList Selections" daily)   Diet/type:Tube feeds on hold given some concern for emesis.  Pain/Anxiety/Delirium protocol Yes and RASS goal: 0 to -1 VAP protocol (if indicated): Yes DVT prophylaxis: LMWH GI prophylaxis: PPI Glucose control:  SSI Central venous access:  Yes, and it is still needed Arterial line:  N/A Foley:  N/A and Yes, and it is still needed Mobility:  bed rest  PT consulted: N/A Code Status:  full code Last date of multidisciplinary goals of care discussion -he apparently has 2 brothers in [place] and [place] but we do not have their contacts.  A coworker is working on Health and safety inspector for family in [place] (C[employer]ward Drone can be reached on [phone]) Disposition: remains critically ill, will stay in intensive care   CC time: 30 min.   Rutherford Guys, PA - C [facility] For pager details, please see AMION or use Epic chat  After 1900, please call [facility] for cross coverage needs 09/15/2020, 8:36 AM'

## 2020-09-16 ENCOUNTER — Inpatient Hospital Stay (HOSPITAL_COMMUNITY): Payer: Medicare Other

## 2020-09-16 ENCOUNTER — Inpatient Hospital Stay: Payer: Self-pay

## 2020-09-16 LAB — ECHOCARDIOGRAM LIMITED
Area-P 1/2: 3.36 cm2
Height: 69 in
S' Lateral: 4.8 cm
Weight: 2959.46 oz

## 2020-09-16 LAB — RENAL FUNCTION PANEL
Albumin: 2.1 g/dL — ABNORMAL LOW (ref 3.5–5.0)
Anion gap: 8 (ref 5–15)
BUN: 41 mg/dL — ABNORMAL HIGH (ref 8–23)
CO2: 22 mmol/L (ref 22–32)
Calcium: 8.2 mg/dL — ABNORMAL LOW (ref 8.9–10.3)
Chloride: 119 mmol/L — ABNORMAL HIGH (ref 98–111)
Creatinine, Ser: 1.51 mg/dL — ABNORMAL HIGH (ref 0.61–1.24)
GFR, Estimated: 50 mL/min — ABNORMAL LOW (ref >60)
Glucose, Bld: 163 mg/dL — ABNORMAL HIGH (ref 70–99)
Phosphorus: 5.4 mg/dL — ABNORMAL HIGH (ref 2.5–4.6)
Potassium: 5.3 mmol/L — ABNORMAL HIGH (ref 3.5–5.1)
Sodium: 149 mmol/L — ABNORMAL HIGH (ref 135–145)

## 2020-09-16 LAB — COOXEMETRY PANEL
Carboxyhemoglobin: 0.8 % (ref 0.5–1.5)
Methemoglobin: 0.7 % (ref 0.0–1.5)
O2 Saturation: 73.4 %
Total hemoglobin: 13.8 g/dL (ref 12.0–16.0)

## 2020-09-16 LAB — GLUCOSE, CAPILLARY
Glucose-Capillary: 107 mg/dL — ABNORMAL HIGH (ref 70–99)
Glucose-Capillary: 130 mg/dL — ABNORMAL HIGH (ref 70–99)
Glucose-Capillary: 133 mg/dL — ABNORMAL HIGH (ref 70–99)
Glucose-Capillary: 134 mg/dL — ABNORMAL HIGH (ref 70–99)
Glucose-Capillary: 137 mg/dL — ABNORMAL HIGH (ref 70–99)
Glucose-Capillary: 137 mg/dL — ABNORMAL HIGH (ref 70–99)
Glucose-Capillary: 141 mg/dL — ABNORMAL HIGH (ref 70–99)
Glucose-Capillary: 143 mg/dL — ABNORMAL HIGH (ref 70–99)
Glucose-Capillary: 160 mg/dL — ABNORMAL HIGH (ref 70–99)
Glucose-Capillary: 76 mg/dL (ref 70–99)

## 2020-09-16 LAB — BASIC METABOLIC PANEL
Anion gap: 4 — ABNORMAL LOW (ref 5–15)
BUN: 34 mg/dL — ABNORMAL HIGH (ref 8–23)
CO2: 23 mmol/L (ref 22–32)
Calcium: 8.5 mg/dL — ABNORMAL LOW (ref 8.9–10.3)
Chloride: 120 mmol/L — ABNORMAL HIGH (ref 98–111)
Creatinine, Ser: 1.23 mg/dL (ref 0.61–1.24)
GFR, Estimated: >60 mL/min (ref >60)
Glucose, Bld: 145 mg/dL — ABNORMAL HIGH (ref 70–99)
Potassium: 4.6 mmol/L (ref 3.5–5.1)
Sodium: 147 mmol/L — ABNORMAL HIGH (ref 135–145)

## 2020-09-16 LAB — CBC
HCT: 37.4 % — ABNORMAL LOW (ref 39.0–52.0)
Hemoglobin: 11.7 g/dL — ABNORMAL LOW (ref 13.0–17.0)
MCH: 30.2 pg (ref 26.0–34.0)
MCHC: 31.3 g/dL (ref 30.0–36.0)
MCV: 96.4 fL (ref 80.0–100.0)
Platelets: 343 10*3/uL (ref 150–400)
RBC: 3.88 MIL/uL — ABNORMAL LOW (ref 4.22–5.81)
RDW: 14.5 % (ref 11.5–15.5)
WBC: 10.4 10*3/uL (ref 4.0–10.5)
nRBC: 0.2 % (ref 0.0–0.2)

## 2020-09-16 LAB — AMMONIA: Ammonia: 33 umol/L (ref 9–35)

## 2020-09-16 MED ORDER — ACETAMINOPHEN 325 MG PO TABS: 650.0000 mg | ORAL_TABLET | ORAL | Status: DC | PRN

## 2020-09-16 MED ORDER — PERFLUTREN LIPID MICROSPHERE
1.0000 mL | INTRAVENOUS | Status: AC | PRN
  Administered 2020-09-16: 3 mL via INTRAVENOUS
  Filled 2020-09-16: qty 10

## 2020-09-16 MED ORDER — GABAPENTIN 250 MG/5ML PO SOLN
100.0000 mg | Freq: Three times a day (TID) | ORAL | Status: DC
  Administered 2020-09-16 – 2020-09-21 (×17): 100 mg
  Filled 2020-09-16 (×18): qty 2

## 2020-09-16 MED ORDER — ACETAMINOPHEN 160 MG/5ML PO SOLN
650.0000 mg | ORAL | Status: DC | PRN
  Administered 2020-09-16 – 2020-09-22 (×11): 650 mg
  Filled 2020-09-16 (×11): qty 20.3

## 2020-09-16 MED ORDER — ACETAMINOPHEN 325 MG PO TABS
650.0000 mg | ORAL_TABLET | ORAL | Status: DC | PRN
  Filled 2020-09-16: qty 2

## 2020-09-16 MED ORDER — MIDAZOLAM 50MG/50ML (1MG/ML) PREMIX INFUSION
0.5000 mg/h | INTRAVENOUS | Status: DC
  Administered 2020-09-16 (×2): 3 mg/h via INTRAVENOUS
  Filled 2020-09-16 (×2): qty 50

## 2020-09-16 MED ORDER — FUROSEMIDE 10 MG/ML IJ SOLN
80.0000 mg | Freq: Three times a day (TID) | INTRAMUSCULAR | Status: DC
  Administered 2020-09-16 – 2020-09-17 (×3): 80 mg via INTRAVENOUS
  Filled 2020-09-16 (×3): qty 8

## 2020-09-16 MED ORDER — MAGNESIUM SULFATE 2 GM/50ML IV SOLN
2.0000 g | Freq: Once | INTRAVENOUS | Status: AC
  Administered 2020-09-16: 2 g via INTRAVENOUS
  Filled 2020-09-16: qty 50

## 2020-09-16 MED ORDER — METOLAZONE 5 MG PO TABS
5.0000 mg | ORAL_TABLET | Freq: Once | ORAL | Status: AC
  Administered 2020-09-16: 5 mg via ORAL
  Filled 2020-09-16: qty 1

## 2020-09-16 NOTE — Plan of Care (Signed)
  Problem: Education: Goal: Knowledge of General Education information will improve Description: Including pain rating scale, medication(s)/side effects and non-pharmacologic comfort measures Outcome: Not Progressing   Problem: Clinical Measurements: Goal: Will remain free from infection Outcome: Progressing Goal: Diagnostic test results will improve Outcome: Progressing Goal: Respiratory complications will improve Outcome: Not Progressing   Problem: Activity: Goal: Risk for activity intolerance will decrease Outcome: Not Progressing

## 2020-09-16 NOTE — Progress Notes (Addendum)
NAME:  Jesse Lowe, MRN:  [id number], DOB:  [birth year], LOS: 10 ADMISSION DATE:  09/09/2020, CONSULTATION DATE:  6/20 REFERRING MD:  Jesse Lowe, CHIEF COMPLAINT:  delirium and respiratory distress    History of Present Illness:  70 yr old male admitted with anterior wall STEMI .went to cath lab emergently. Found to have lesion in LAD and high grade stenosis of the RCA and Circ. Underwent successful stent of LAD. And IAB placement. He was weaned off pressors. IABP dc'd am 6/20. Progressively more confused over course of day (had received Ambien that evening) 6/20 w/ increased WOB and agitation. PCCM consulted that afternoon for agitated delirium.   Pertinent  Medical History  HTN and HL Had not seen MD in 10-15 yrs.  Significant Hospital Events: Including procedures, antibiotic start and stop dates in addition to other pertinent events   6/19 admitted w/ STEMI. While waiting had VF arrest req'd ACLS w/ defib, amio epi; estimated 5 minutes to ROSC. Was awake and alert after ROSC obtained. Went to cath lab emergently. Found to have lesion in LAD and high grade stenosis of the RCA and Circ. Underwent successful stent of LAD. And IAB placement 6/20 off IABP. Pressors off. Progressive agitation and WOB. Intubated PCCM asked to consult 6/24 reintubated for shock, milrinone restarted Echo 09/07/2020 > Left ventricular ejection fraction, by estimation, is 20 to 25%. The left ventricle has severely decreased function. Left ventricular diastolic parameters were normal. There is akinesis of the left ventricular, entire anterior wall, anterolateral wall and apical Segment, mildly elevated pulmonary artery systolic pressure. The estimated right ventricular systolic pressure is 33.9 mmHg. Left atrial size was mildly dilated. 6/28 weaning sedation. Bronched with copious secretions, some hemoptysis seemed like from suction trauma 6/29 incr sedation. Having high RR with desats. Worse rhonchi, new wheeze. Adding  aggressive lasix. Adding versed gtt and gaba   Interim History / Subjective:  RR 40s on PRVC with desats from high 90s to low 80s  Fent incr and got a bolus LIJ with 2 malfunctioning lumens.   D/w dr Nadara Eaton at bedside  Objective   Blood pressure 109/61, pulse 81, temperature 100.22 F (37.9 C), resp. rate 20, height 5\' 9"  (1.753 m), weight 83.9 kg, SpO2 97 %. CVP:  [11 mmHg-12 mmHg] 11 mmHg  Vent Mode: PRVC FiO2 (%):  [40 %-50 %] 40 % Set Rate:  [22 bmp] 22 bmp Vt Set:  [490 mL] 490 mL PEEP:  [5 cmH20] 5 cmH20 Pressure Support:  [10 cmH20] 10 cmH20 Plateau Pressure:  [12 cmH20-19 cmH20] 13 cmH20   Intake/Output Summary (Last 24 hours) at 09/16/2020 0718 Last data filed at 09/16/2020 0600 Gross per 24 hour  Intake 2053.39 ml  Output 1130 ml  Net 923.39 ml   Filed Weights   09/11/20 0500 09/15/20 0404 09/16/20 0500  Weight: 84.2 kg 84.4 kg 83.9 kg    Examination: General: Critically ill older adult M intubated sedated, distressed appearing Neuro: Sedated, does not follow command. Moved LUE spontaneously. + corneal. Furrowed brow to pain. PERRL  HEENT: NCAT pink mm ETT secure anicteric sclera  Cardiovascular: tachycardic rate reg rhythm.  S1s2 no rgm. Cap refill brisk  Lungs: incr RR abd muscle use, scalene use, bilateral rhonchi and wheeze. Mechanically ventilated  Abdomen: soft round ndnt +bowel sounds  Musculoskeletal: BUE BLE edema. No acute joint deformity no cyanosis or clubbing  Skin: pale c/d/w no rash    Assessment & Plan:   Acute metabolic encephalopathy  -? Etoh (no known  hx of abuse/dependence), shock, CNS depressing meds ? delirium -CT not c/w anoxic injury P RASS goal 0 to -1 Adding versed gtt to fent gtt Adding enteral gaba, cont enteral seroquel Thiamine, Folic Acid  Acute hypoxic respiratory failure  -pulm edema, suspected aspiration, hemoptysis due to suction trauma -worse rhonchi 6/29 and cardiogenic wheeze  P PRN nebs, 2g mag 80mg  lasix q8   Vanc, cefepime  Continue vanc, cefepime Follow trach aspirate  Wean vent as able, not a candidate for WUA/SBT 6/29  Brief VF arrest due to STEMI  STEMI s/p LAD stent Acute systolic HFrEF  -- EV approx 10%  Cardiogenic shock Hx CAD, involving RCA and LCX  P Lovenox, brilinta per cards Trend coox NE, milrinone -- weaning Ne 6/29   AKI -- ATN due to shock, improved  Volume overload  P Trend renal indices, UOP Lasix as above starting 6/29  Constipation P Continue aggressive bowel reg   Transaminitis - improving -secondary to ischemia, hypoxia, shock  Plan PRN LFTs    Best Practice (right click and "Reselect all SmartList Selections" daily)   Diet/type: EN  Pain/Anxiety/Delirium protocol Yes and RASS goal: 0 to -1 VAP protocol (if indicated): Yes DVT prophylaxis: LMWH GI prophylaxis: PPI Glucose control:  SSI Central venous access:  Yes, and it is still needed Arterial line:  N/A Foley:  N/A and Yes, and it is still needed Mobility:  bed rest  PT consulted: N/A Code Status:  full code Last date of multidisciplinary goals of care discussion -he apparently has 2 brothers in [place] and [place] but we do not have their contacts.  A coworker is working on [place] for family in Health and safety inspector ([employer] [place] can be reached on [phone]) In ccm conversation w Dr. [phone] 6/29, dr 7/29 indicated pt previously has expressed being estranged from family friends and not having a person he would have/want to make medical decisions  Disposition: remains critically ill, will stay in intensive care   CRITICAL CARE Performed by: Jesse Lowe  Total critical care time: 49 minutes  Critical care time was exclusive of separately billable procedures and treating other patients. Critical care was necessary to treat or prevent imminent or life-threatening deterioration.  Critical care was time spent personally by me on the following activities: development of treatment plan  with patient and/or surrogate as well as nursing, discussions with consultants, evaluation of patient's response to treatment, examination of patient, obtaining history from patient or surrogate, ordering and performing treatments and interventions, ordering and review of laboratory studies, ordering and review of radiographic studies, pulse oximetry and re-evaluation of patient's condition.   Lanier Clam MSN, AGACNP-BC [facility] Amion for pager  09/16/2020, 7:18 AM

## 2020-09-16 NOTE — Progress Notes (Signed)
Subjective:   Patient remains intubated  Intake/Output from previous day:  I/O last 3 completed shifts: In: 4724.4 [I.V.:1966.6; NG/GT:1857.8; IV Piggyback:900.1] Out: 2075 [Urine:2075] Total I/O In: 71.7 [I.V.:51.7; NG/GT:20] Out: 200 [Urine:200]  Blood pressure 133/74, pulse (!) 114, temperature 100.04 F (37.8 C), resp. rate (!) 30, height 5' 9"  (1.753 m), weight 84.4 kg, SpO2 91 %.  Vitals with BMI 09/15/2020 09/15/2020 09/15/2020  Height - - -  Weight - - -  BMI - - -  Systolic 878 676 720  Diastolic 74 70 62  Pulse 947 75 81     Physical Exam Vitals reviewed.  Constitutional:      General: He is not in acute distress.    Appearance: Normal appearance.  HENT:     Head: Normocephalic and atraumatic.  Neck:     Vascular: JVD present. No carotid bruit.  Cardiovascular:     Rate and Rhythm: Normal rate and regular rhythm.     Pulses: Normal pulses and intact distal pulses.     Heart sounds: S1 normal and S2 normal. No murmur heard.   No gallop.  Pulmonary:     Effort: Respiratory distress present.     Breath sounds: Rhonchi (Bilateral diffuse and extensive) and rales (Bilateral diffuse and extensive) present. No wheezing.  Abdominal:     General: Bowel sounds are normal. There is no distension.  Musculoskeletal:     Right lower leg: No edema.     Left lower leg: No edema.  Skin:    General: Skin is warm.     Capillary Refill: Capillary refill takes less than 2 seconds.  Neurological:     Mental Status: He is disoriented and confused.  5.  Lab Results: BMP BNP (last 3 results) Recent Labs    09/11/20 0320 09/12/20 0433 09/13/20 0203  BNP 2,312.1* 2,242.1* 1,687.7*    ProBNP (last 3 results) No results for input(s): PROBNP in the last 8760 hours. BMP Latest Ref Rng & Units 09/15/2020 09/14/2020 09/14/2020  Glucose 70 - 99 mg/dL 135(H) 117(H) 161(H)  BUN 8 - 23 mg/dL 33(H) 33(H) 39(H)  Creatinine 0.61 - 1.24 mg/dL 1.38(H) 1.47(H) 1.61(H)  Sodium 135 -  145 mmol/L 147(H) 148(H) 147(H)  Potassium 3.5 - 5.1 mmol/L 3.5 3.4(L) 3.5  Chloride 98 - 111 mmol/L 113(H) 112(H) 109  CO2 22 - 32 mmol/L 26 27 30   Calcium 8.9 - 10.3 mg/dL 8.3(L) 8.2(L) 8.2(L)   Hepatic Function Latest Ref Rng & Units 09/12/2020 09/10/2020 09/09/2020  Total Protein 6.5 - 8.1 g/dL 6.1(L) 5.9(L) 5.4(L)  Albumin 3.5 - 5.0 g/dL 2.8(L) 2.6(L) 2.6(L)  AST 15 - 41 U/L 91(H) 96(H) 161(H)  ALT 0 - 44 U/L 129(H) 60(H) 80(H)  Alk Phosphatase 38 - 126 U/L 93 85 86  Total Bilirubin 0.3 - 1.2 mg/dL 0.8 1.1 1.0  Bilirubin, Direct 0.0 - 0.2 mg/dL - 0.2 -   CBC Latest Ref Rng & Units 09/15/2020 09/14/2020 09/13/2020  WBC 4.0 - 10.5 K/uL 11.0(H) 12.1(H) 11.3(H)  Hemoglobin 13.0 - 17.0 g/dL 10.2(L) 10.8(L) 11.3(L)  Hematocrit 39.0 - 52.0 % 33.0(L) 33.6(L) 34.9(L)  Platelets 150 - 400 K/uL 371 336 311   Lipid Panel     Component Value Date/Time   CHOL 182 09/13/2020 1551   TRIG 225 (H) 09/08/2020 1551   HDL 31 (L) 09/11/2020 1551   CHOLHDL 5.9 09/11/2020 1551   VLDL 45 (H) 08/25/2020 1551   LDLCALC 106 (H) 08/24/2020 1551   Cardiac Panel (last 3  results) No results for input(s): CKTOTAL, CKMB, TROPONINI, RELINDX in the last 72 hours.   HEMOGLOBIN A1C Lab Results  Component Value Date   HGBA1C 5.3 09/07/2020   MPG 105 09/07/2020   TSH Recent Labs    08/21/2020 1859  TSH 2.235    BNP (last 3 results) Recent Labs    09/11/20 0320 09/12/20 0433 09/13/20 0203  BNP 2,312.1* 2,242.1* 1,687.7*     ProBNP (last 3 results) No results for input(s): PROBNP in the last 8760 hours.  Imaging: Portable chest x-ray 09/09/2020:  Central lines and tubes in stable position.  Cardiomegaly again noted.  Bilateral pulmonary infiltrates/edema again noted.  Findings suggestive of CHF with bilateral pleural effusion.  DG Chest X-ray 08/26/2020:  Cardiac shadow is within normal limits. Prior coronary stenting is seen. Intra-aortic balloon pump is noted just below the aortic knob. Lungs  are well aerated bilaterally with mild interstitial edema.  IMPRESSION: Mild interstitial edema. Balloon pump in satisfactory position.  Cardiac Studies: Left heart catheterization 08/21/2020: LV 56/15, EDP 21 mmHg.  Aorta 75/44, mean 57 mmHg.  No pressure gradient across the aortic valve.  Markedly elevated EDP. LM: Large vessel, bifurcates into circumflex and LAD. LAD: Has ulcerated subtotally occluded proximal LAD stenosis that extends to the mid segment.  There is TIMI I flow also in the D1 and D2.  Moderate-sized D1 and small sized D2.  Mild disease in the mid to distal LAD. CX: Moderate sized vessel, gives origin to a large OM1, OM 2 is very small, there is a lesion after OM 2 at 80% focal lesion.  OM 3 is moderate sized with secondary branches. RCA: Dominant.  Proximal segment 90% stenosis, tandem 30 to 40% stenosis in the midsegment followed by a high-grade 90% stenosis.  Mild disease in the distal right coronary.   Intervention: Successful 2 overlapping stent implantation with 3.0 x 24 mm followed by 3.0 x 16 mm Synergy XD stents, stenosis reduced from 99% to 0%, TIMI I improved to TIMI-3 flow.   Recommendation: Patient will need relook at the LAD stent in view of severe spasm and cardiogenic shock and need for pressor support in the cardiac catheterization lab.  Patient was started on Levophed due to low blood pressure and intra-aortic balloon pump was inserted percutaneously prior to angioplasty.  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  115 mL contrast utilized.  Echocardiogram 09/07/2020:   1. Left ventricular ejection fraction, by estimation, is 20 to 25%. The left ventricle has severely decreased function. The left ventricle demonstrates regional wall motion abnormalities (see scoring diagram/findings for description). Left ventricular diastolic parameters were  normal. There is akinesis of the left ventricular, entire anterior wall, anterolateral wall and apical segment.   2. Right ventricular systolic function is normal. The right ventricular size is normal. There is mildly elevated pulmonary artery systolic pressure. The estimated right ventricular systolic pressure is 09.6 mmHg.   3. Left atrial size was mildly dilated.   4. The mitral valve is normal in structure. No evidence of mitral valve regurgitation. No evidence of mitral stenosis.   5. The aortic valve is normal in structure. Aortic valve regurgitation is not visualized. No aortic stenosis is present.  Limited echocardiogram 09/11/2020: LVEF 10 to 20%.  Global hypokinesis, anterior, anterolateral and apical akinesis.  No significant valvular abnormality.  No pericardial effusion.  EKG:   EKG  09/10/2020: Normal sinus rhythm at rate of 83 bpm, normal axis, poor R wave progression, cannot exclude anteroseptal infarct old.  Nonspecific ST-T abnormality.  Compared to 09/15/2020, minimal anterolateral ST elevation not present.  09/13/2020: 1557 hrs.: Ventricular fibrillation. 08/22/2020 at 1550 hrs.: Anteroseptal and high lateral STEMI.   Telemetry: Sinus rhythm with runs of nonsustained ventricular tachycardia 6-8 beats  Scheduled Meds:  aspirin  81 mg Per Tube Daily   atorvastatin  80 mg Per Tube Daily   chlorhexidine gluconate (MEDLINE KIT)  15 mL Mouth Rinse BID   Chlorhexidine Gluconate Cloth  6 each Topical Daily   docusate  100 mg Per Tube BID   enoxaparin (LOVENOX) injection  40 mg Subcutaneous Q24H   feeding supplement (PROSource TF)  45 mL Per Tube BID   fentaNYL (SUBLIMAZE) injection  25 mcg Intravenous Once   folic acid  1 mg Per Tube Daily   insulin aspart  0-15 Units Subcutaneous Q4H   ivabradine  7.5 mg Per Tube BID WC   LORazepam  0-4 mg Intravenous Q4H   Followed by   LORazepam  0-4 mg Intravenous Q8H   mouth rinse  15 mL Mouth Rinse 10 times per day   metoCLOPramide   10 mg Per Tube BID   multivitamin with minerals  1 tablet Per Tube Daily   pantoprazole (PROTONIX) IV  40 mg Intravenous Daily   polyethylene glycol  17 g Per Tube Daily   potassium chloride  40 mEq Per Tube BID   QUEtiapine  25 mg Per Tube BID   sodium chloride flush  10-40 mL Intracatheter Q12H   sodium chloride flush  3 mL Intravenous Q12H   thiamine injection  100 mg Intravenous Daily   ticagrelor  90 mg Per Tube BID   Continuous Infusions:  sodium chloride Stopped (09/15/20 0107)   ceFEPime (MAXIPIME) IV 2 g (09/15/20 2156)   feeding supplement (VITAL 1.5 CAL) 30 mL/hr at 09/15/20 2327   fentaNYL infusion INTRAVENOUS 100 mcg/hr (09/15/20 2000)   milrinone 0.25 mcg/kg/min (09/15/20 2000)   norepinephrine (LEVOPHED) Adult infusion 5 mcg/min (09/15/20 2000)   vancomycin Stopped (09/15/20 1320)   PRN Meds:.albuterol, bisacodyl, fentaNYL, LORazepam **OR** LORazepam, midazolam, midazolam, nitroGLYCERIN, ondansetron (ZOFRAN) IV, sodium chloride flush, sodium chloride flush  Assessment/Plan:   1.  Cardiogenic shock.  2.  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  Acute renal failure secondary to ATN from cardiogenic shock. Renal function improving 4. Altered mental status,  out of proportion to anoxic brain injury now emparically being treated for EtOH withdrawal.   Recommendation:   Very slow to recover. Patient gets agitated on withdrawal of sedatives. BP is stable on low dose of Levo. He is positive on fluid balance and still congested with JVD and also elevated BNP and needing respiratory support. Will need to be diuresed and may need loop diuretics. If no change by tomorrow will discuss with PCCM and also [place] may not be a bad idea.   Adrian Prows, MD, [facility] 09/16/2020, 12:09 AM Office: [phone] Fax: [phone] Pager: [phone]

## 2020-09-16 NOTE — Progress Notes (Signed)
Peripherally Inserted Central Catheter Placement  The IV Nurse has discussed with the patient and/or persons authorized to consent for the patient, the purpose of this procedure and the potential benefits and risks involved with this procedure.  The benefits include less needle sticks, lab draws from the catheter, and the patient may be discharged home with the catheter. Risks include, but not limited to, infection, bleeding, blood clot (thrombus formation), and puncture of an artery; nerve damage and irregular heartbeat and possibility to perform a PICC exchange if needed/ordered by physician.  Alternatives to this procedure were also discussed.  Bard Power PICC patient education guide, fact sheet on infection prevention and patient information card has been provided to patient /or left at bedside.    PICC Placement Documentation  PICC Triple Lumen 09/16/20 PICC Right Brachial 40 cm 0 cm (Active)  Indication for Insertion or Continuance of Line Vasoactive infusions 09/16/20 1800  Exposed Catheter (cm) 0 cm 09/16/20 1800  Site Assessment Clean;Dry;Intact 09/16/20 1800  Lumen #1 Status Flushed;Blood return noted;Saline locked 09/16/20 1800  Lumen #2 Status Flushed;Blood return noted;Saline locked 09/16/20 1800  Lumen #3 Status Flushed;Blood return noted;Saline locked 09/16/20 1800  Dressing Type Transparent 09/16/20 1800  Dressing Status Clean;Dry;Intact 09/16/20 1800  Antimicrobial disc in place? Yes 09/16/20 1800  Dressing Change Due 09/22/2020 09/16/20 1800       Audrie Gallus 09/16/2020, 6:04 PM

## 2020-09-16 NOTE — Progress Notes (Signed)
Subjective:   Patient remains intubated, in mild respiratory distress this morning. UOP marginal.  Intake/Output from previous day:  I/O last 3 completed shifts: In: 3689.9 [I.V.:1846.5; NG/GT:1243.3; IV Piggyback:600.1] Out: 1925 [Urine:1925] Total I/O In: -  Out: 225 [Urine:225]  Blood pressure 139/90, pulse (!) 122, temperature 100.22 F (37.9 C), resp. rate (!) 35, height _0  (1.753 m), weight 83.9 kg, SpO2 92 %.  Vitals with BMI 09/16/2020 09/16/2020 09/16/2020  Height - - -  Weight - - 184 lbs 15 oz  BMI - - 50.0  Systolic 370 488 891  Diastolic 90 61 67  Pulse 694 81 80     Physical Exam Vitals reviewed.  Constitutional:      General: He is in acute distress (respiratory).     Appearance: He is ill-appearing and diaphoretic.     Interventions: He is sedated and intubated.  HENT:     Head: Normocephalic and atraumatic.  Neck:     Vascular: JVD present. No carotid bruit.  Cardiovascular:     Rate and Rhythm: Regular rhythm. Tachycardia present.     Pulses: Normal pulses and intact distal pulses.     Heart sounds: S1 normal and S2 normal. No murmur heard.   No gallop.  Pulmonary:     Effort: Respiratory distress present. He is intubated.     Breath sounds: Rhonchi (Bilateral diffuse and extensive) and rales (Bilateral diffuse and extensive) present. No wheezing.  Abdominal:     General: Bowel sounds are normal. There is no distension.  Musculoskeletal:     Right lower leg: No edema.     Left lower leg: No edema.  Skin:    General: Skin is warm.     Capillary Refill: Capillary refill takes less than 2 seconds.  Neurological:     Mental Status: He is disoriented and confused.  5.  Lab Results: BMP BNP (last 3 results) Recent Labs    09/11/20 0320 09/12/20 0433 09/13/20 0203  BNP 2,312.1* 2,242.1* 1,687.7*    ProBNP (last 3 results) No results for input(s): PROBNP in the last 8760 hours. BMP Latest Ref Rng & Units 09/16/2020 09/15/2020 09/14/2020   Glucose 70 - 99 mg/dL 145(H) 135(H) 117(H)  BUN 8 - 23 mg/dL 34(H) 33(H) 33(H)  Creatinine 0.61 - 1.24 mg/dL 1.23 1.38(H) 1.47(H)  Sodium 135 - 145 mmol/L 147(H) 147(H) 148(H)  Potassium 3.5 - 5.1 mmol/L 4.6 3.5 3.4(L)  Chloride 98 - 111 mmol/L 120(H) 113(H) 112(H)  CO2 22 - 32 mmol/L _1 Calcium 8.9 - 10.3 mg/dL 8.5(L) 8.3(L) 8.2(L)   Hepatic Function Latest Ref Rng & Units 09/12/2020 09/10/2020 09/09/2020  Total Protein 6.5 - 8.1 g/dL 6.1(L) 5.9(L) 5.4(L)  Albumin 3.5 - 5.0 g/dL 2.8(L) 2.6(L) 2.6(L)  AST 15 - 41 U/L 91(H) 96(H) 161(H)  ALT 0 - 44 U/L 129(H) 60(H) 80(H)  Alk Phosphatase 38 - 126 U/L 93 85 86  Total Bilirubin 0.3 - 1.2 mg/dL 0.8 1.1 1.0  Bilirubin, Direct 0.0 - 0.2 mg/dL - 0.2 -   CBC Latest Ref Rng & Units 09/16/2020 09/15/2020 09/14/2020  WBC 4.0 - 10.5 K/uL 10.4 11.0(H) 12.1(H)  Hemoglobin 13.0 - 17.0 g/dL 11.7(L) 10.2(L) 10.8(L)  Hematocrit 39.0 - 52.0 % 37.4(L) 33.0(L) 33.6(L)  Platelets 150 - 400 K/uL 343 371 336   Lipid Panel     Component Value Date/Time   CHOL 182 08/26/2020 1551   TRIG 225 (H) 08/20/2020 1551   HDL 31 (L) 08/27/2020 1551  CHOLHDL 5.9 08/22/2020 1551   VLDL 45 (H) 09/05/2020 1551   LDLCALC 106 (H) 08/24/2020 1551   Cardiac Panel (last 3 results) No results for input(s): CKTOTAL, CKMB, TROPONINI, RELINDX in the last 72 hours.   HEMOGLOBIN A1C Lab Results  Component Value Date   HGBA1C 5.3 09/07/2020   MPG 105 09/07/2020   TSH Recent Labs    09/13/2020 1859  TSH 2.235    BNP (last 3 results) Recent Labs    09/11/20 0320 09/12/20 0433 09/13/20 0203  BNP 2,312.1* 2,242.1* 1,687.7*     ProBNP (last 3 results) No results for input(s): PROBNP in the last 8760 hours.  Imaging: Portable chest x-ray 09/09/2020:  Central lines and tubes in stable position.  Cardiomegaly again noted.  Bilateral pulmonary infiltrates/edema again noted.  Findings suggestive of CHF with bilateral pleural effusion.  DG Chest X-ray  08/23/2020:  Cardiac shadow is within normal limits. Prior coronary stenting is seen. Intra-aortic balloon pump is noted just below the aortic knob. Lungs are well aerated bilaterally with mild interstitial edema.  IMPRESSION: Mild interstitial edema. Balloon pump in satisfactory position.  Cardiac Studies: Left heart catheterization 09/07/2020: LV 56/15, EDP 21 mmHg.  Aorta 75/44, mean 57 mmHg.  No pressure gradient across the aortic valve.  Markedly elevated EDP. LM: Large vessel, bifurcates into circumflex and LAD. LAD: Has ulcerated subtotally occluded proximal LAD stenosis that extends to the mid segment.  There is TIMI I flow also in the D1 and D2.  Moderate-sized D1 and small sized D2.  Mild disease in the mid to distal LAD. CX: Moderate sized vessel, gives origin to a large OM1, OM 2 is very small, there is a lesion after OM 2 at 80% focal lesion.  OM 3 is moderate sized with secondary branches. RCA: Dominant.  Proximal segment 90% stenosis, tandem 30 to 40% stenosis in the midsegment followed by a high-grade 90% stenosis.  Mild disease in the distal right coronary.   Intervention: Successful 2 overlapping stent implantation with 3.0 x 24 mm followed by 3.0 x 16 mm Synergy XD stents, stenosis reduced from 99% to 0%, TIMI I improved to TIMI-3 flow.   Recommendation: Patient will need relook at the LAD stent in view of severe spasm and cardiogenic shock and need for pressor support in the cardiac catheterization lab.  Patient was started on Levophed due to low blood pressure and intra-aortic balloon pump was inserted percutaneously prior to angioplasty.  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  115 mL contrast utilized.  Echocardiogram 09/07/2020:   1. Left ventricular ejection fraction, by estimation, is 20 to 25%. The left ventricle has severely decreased function. The left  ventricle demonstrates regional wall motion abnormalities (see scoring diagram/findings for description). Left ventricular diastolic parameters were normal. There is akinesis of the left ventricular, entire anterior wall, anterolateral wall and apical segment.   2. Right ventricular systolic function is normal. The right ventricular size is normal. There is mildly elevated pulmonary artery systolic pressure. The estimated right ventricular systolic pressure is 25.6 mmHg.   3. Left atrial size was mildly dilated.   4. The mitral valve is normal in structure. No evidence of mitral valve regurgitation. No evidence of mitral stenosis.   5. The aortic valve is normal in structure. Aortic valve regurgitation is not visualized. No aortic stenosis is present.  Limited echocardiogram 09/11/2020: LVEF 10  to 20%.  Global hypokinesis, anterior, anterolateral and apical akinesis.  No significant valvular abnormality.  No pericardial effusion.  EKG:   EKG 09/10/2020: Normal sinus rhythm at rate of 83 bpm, normal axis, poor R wave progression, cannot exclude anteroseptal infarct old.  Nonspecific ST-T abnormality.  Compared to 09/15/2020, minimal anterolateral ST elevation not present.  09/09/2020: 1557 hrs.: Ventricular fibrillation. 08/20/2020 at 1550 hrs.: Anteroseptal and high lateral STEMI.   Telemetry: Sinus rhythm with runs of nonsustained ventricular tachycardia 6-8 beats  Scheduled Meds:  aspirin  81 mg Per Tube Daily   atorvastatin  80 mg Per Tube Daily   chlorhexidine gluconate (MEDLINE KIT)  15 mL Mouth Rinse BID   Chlorhexidine Gluconate Cloth  6 each Topical Daily   docusate  100 mg Per Tube BID   enoxaparin (LOVENOX) injection  40 mg Subcutaneous Q24H   feeding supplement (PROSource TF)  45 mL Per Tube BID   fentaNYL (SUBLIMAZE) injection  25 mcg Intravenous Once   folic acid  1 mg Per Tube Daily   furosemide  80 mg Intravenous Q8H   gabapentin  100 mg Per Tube Q8H   insulin aspart  0-15  Units Subcutaneous Q4H   ivabradine  7.5 mg Per Tube BID WC   LORazepam  0-4 mg Intravenous Q4H   Followed by   LORazepam  0-4 mg Intravenous Q8H   mouth rinse  15 mL Mouth Rinse 10 times per day   metoCLOPramide  10 mg Per Tube BID   multivitamin with minerals  1 tablet Per Tube Daily   pantoprazole (PROTONIX) IV  40 mg Intravenous Daily   polyethylene glycol  17 g Per Tube Daily   potassium chloride  40 mEq Per Tube BID   QUEtiapine  25 mg Per Tube BID   sodium chloride flush  10-40 mL Intracatheter Q12H   sodium chloride flush  3 mL Intravenous Q12H   thiamine injection  100 mg Intravenous Daily   ticagrelor  90 mg Per Tube BID   Continuous Infusions:  sodium chloride Stopped (09/15/20 0107)   ceFEPime (MAXIPIME) IV 2 g (09/16/20 0920)   feeding supplement (VITAL 1.5 CAL) 40 mL/hr at 09/16/20 0730   fentaNYL infusion INTRAVENOUS 125 mcg/hr (09/16/20 0856)   magnesium sulfate bolus IVPB     midazolam     milrinone 0.25 mcg/kg/min (09/16/20 0600)   norepinephrine (LEVOPHED) Adult infusion 5 mcg/min (09/16/20 0600)   vancomycin Stopped (09/15/20 1320)   PRN Meds:.acetaminophen, albuterol, bisacodyl, fentaNYL, LORazepam **OR** LORazepam, midazolam, midazolam, nitroGLYCERIN, ondansetron (ZOFRAN) IV, sodium chloride flush, sodium chloride flush  Assessment/Plan:   1.  Cardiogenic shock.  2.  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  Acute renal failure secondary to ATN from cardiogenic shock. Renal function improving 4. Altered mental status,  out of proportion to anoxic brain injury now emparically being treated for EtOH withdrawal.   Recommendation:   Very slow to recover. Patient gets agitated on withdrawal of sedatives. BP is stable on low dose of Levo (90mg).  He is positive on fluid balance and still congested with JVD and also elevated BNP and needing respiratory support. Will need to be diuresed, add furosemide 80 mg  TID.  D/W PCCM about goals of therapy. For now continue aggressive management and make decisions regarding comfort measures vs trach and further management.  When I first met him, he had stated no one to be contacted and that he has no friends that I could  call and he was alone. Hence I am comfortable to make decisions regarding his care along with my colleagues involved in his care.  He has bronchospasm and copious aspiration from bronchoscopy yesterday, Hopefully magnesium and albuterol will help relieve some of the respiratory distress that I witnessed this morning.   35 minutes of critical care time   Adrian Prows, MD, [facility] 09/16/2020, 9:20 AM Office: [phone] Fax: [phone] Pager: [phone]

## 2020-09-16 NOTE — Procedures (Signed)
Cortrak  Person Inserting Tube:  Osa Craver, RD Tube Type:  Cortrak - 43 inches Tube Size:  10 Tube Location:  Right nare Initial Placement:  Postpyloric Secured by: Bridle Technique Used to Measure Tube Placement:  Marking at nare/corner of mouth Cortrak Secured At:  91 cm  Cortrak Tube Team Note:  Consult received to place a Cortrak feeding tube.   X-ray is required, abdominal x-ray has been ordered by the Cortrak team. Please confirm tube placement before using the Cortrak tube.   If the tube becomes dislodged please keep the tube and contact the Cortrak team at [email or web address] ([email or web address]) for replacement.  If after hours and replacement cannot be delayed, place a NG tube and confirm placement with an abdominal x-ray.   Romelle Starcher MS, RDN, LDN, CNSC Registered Dietitian III Clinical Nutrition RD Pager and On-Call Pager Number Located in [place]

## 2020-09-16 NOTE — Plan of Care (Signed)

## 2020-09-16 NOTE — Progress Notes (Signed)
  Echocardiogram 2D Echocardiogram has been performed.  Gerda Diss 09/16/2020, 12:08 PM

## 2020-09-17 ENCOUNTER — Inpatient Hospital Stay (HOSPITAL_COMMUNITY): Payer: Medicare Other

## 2020-09-17 LAB — GLUCOSE, CAPILLARY
Glucose-Capillary: 124 mg/dL — ABNORMAL HIGH (ref 70–99)
Glucose-Capillary: 152 mg/dL — ABNORMAL HIGH (ref 70–99)
Glucose-Capillary: 163 mg/dL — ABNORMAL HIGH (ref 70–99)
Glucose-Capillary: 176 mg/dL — ABNORMAL HIGH (ref 70–99)
Glucose-Capillary: 180 mg/dL — ABNORMAL HIGH (ref 70–99)
Glucose-Capillary: 183 mg/dL — ABNORMAL HIGH (ref 70–99)

## 2020-09-17 LAB — RENAL FUNCTION PANEL
Albumin: 2.2 g/dL — ABNORMAL LOW (ref 3.5–5.0)
Albumin: 2.2 g/dL — ABNORMAL LOW (ref 3.5–5.0)
Anion gap: 11 (ref 5–15)
Anion gap: 12 (ref 5–15)
BUN: 53 mg/dL — ABNORMAL HIGH (ref 8–23)
BUN: 66 mg/dL — ABNORMAL HIGH (ref 8–23)
CO2: 24 mmol/L (ref 22–32)
CO2: 23 mmol/L (ref 22–32)
Calcium: 8.5 mg/dL — ABNORMAL LOW (ref 8.9–10.3)
Calcium: 8.2 mg/dL — ABNORMAL LOW (ref 8.9–10.3)
Chloride: 112 mmol/L — ABNORMAL HIGH (ref 98–111)
Chloride: 114 mmol/L — ABNORMAL HIGH (ref 98–111)
Creatinine, Ser: 1.86 mg/dL — ABNORMAL HIGH (ref 0.61–1.24)
Creatinine, Ser: 2.24 mg/dL — ABNORMAL HIGH (ref 0.61–1.24)
GFR, Estimated: 39 mL/min — ABNORMAL LOW (ref >60)
GFR, Estimated: 31 mL/min — ABNORMAL LOW (ref >60)
Glucose, Bld: 192 mg/dL — ABNORMAL HIGH (ref 70–99)
Glucose, Bld: 197 mg/dL — ABNORMAL HIGH (ref 70–99)
Phosphorus: 4.8 mg/dL — ABNORMAL HIGH (ref 2.5–4.6)
Phosphorus: 4.2 mg/dL (ref 2.5–4.6)
Potassium: 3.4 mmol/L — ABNORMAL LOW (ref 3.5–5.1)
Potassium: 4.6 mmol/L (ref 3.5–5.1)
Sodium: 147 mmol/L — ABNORMAL HIGH (ref 135–145)
Sodium: 149 mmol/L — ABNORMAL HIGH (ref 135–145)

## 2020-09-17 LAB — CULTURE, RESPIRATORY W GRAM STAIN

## 2020-09-17 LAB — CBC
HCT: 33.9 % — ABNORMAL LOW (ref 39.0–52.0)
Hemoglobin: 10.5 g/dL — ABNORMAL LOW (ref 13.0–17.0)
MCH: 30 pg (ref 26.0–34.0)
MCHC: 31 g/dL (ref 30.0–36.0)
MCV: 96.9 fL (ref 80.0–100.0)
Platelets: 405 10*3/uL — ABNORMAL HIGH (ref 150–400)
RBC: 3.5 MIL/uL — ABNORMAL LOW (ref 4.22–5.81)
RDW: 14.5 % (ref 11.5–15.5)
WBC: 12.1 10*3/uL — ABNORMAL HIGH (ref 4.0–10.5)
nRBC: 0 % (ref 0.0–0.2)

## 2020-09-17 LAB — COOXEMETRY PANEL
Carboxyhemoglobin: 0.8 % (ref 0.5–1.5)
Methemoglobin: 0.8 % (ref 0.0–1.5)
O2 Saturation: 80.2 %
Total hemoglobin: 10.3 g/dL — ABNORMAL LOW (ref 12.0–16.0)

## 2020-09-17 MED ORDER — SODIUM CHLORIDE 0.9 % IV SOLN
2.0000 g | INTRAVENOUS | Status: DC
  Administered 2020-09-17 – 2020-09-22 (×6): 2 g via INTRAVENOUS
  Filled 2020-09-17 (×2): qty 20
  Filled 2020-09-17: qty 2
  Filled 2020-09-17 (×3): qty 20
  Filled 2020-09-17: qty 2

## 2020-09-17 MED ORDER — PROPOFOL 1000 MG/100ML IV EMUL
0.0000 ug/kg/min | INTRAVENOUS | Status: DC
  Administered 2020-09-17: 5 ug/kg/min via INTRAVENOUS
  Administered 2020-09-17: 10 ug/kg/min via INTRAVENOUS
  Filled 2020-09-17 (×3): qty 100

## 2020-09-17 MED ORDER — POTASSIUM CHLORIDE 10 MEQ/50ML IV SOLN
10.0000 meq | INTRAVENOUS | Status: AC
  Administered 2020-09-17 (×2): 10 meq via INTRAVENOUS
  Filled 2020-09-17 (×2): qty 50

## 2020-09-17 MED ORDER — CHLORHEXIDINE GLUCONATE CLOTH 2 % EX PADS
6.0000 | MEDICATED_PAD | Freq: Every day | CUTANEOUS | Status: DC
  Administered 2020-09-17 – 2020-09-20 (×4): 6 via TOPICAL

## 2020-09-17 MED ORDER — METOLAZONE 5 MG PO TABS
5.0000 mg | ORAL_TABLET | Freq: Once | ORAL | Status: AC
  Administered 2020-09-17: 5 mg via ORAL
  Filled 2020-09-17: qty 1

## 2020-09-17 MED ORDER — POTASSIUM CHLORIDE 20 MEQ PO PACK
40.0000 meq | PACK | Freq: Once | ORAL | Status: AC
  Administered 2020-09-17: 40 meq
  Filled 2020-09-17: qty 2

## 2020-09-17 MED ORDER — FUROSEMIDE 10 MG/ML IJ SOLN
80.0000 mg | Freq: Two times a day (BID) | INTRAMUSCULAR | Status: DC
  Administered 2020-09-17 – 2020-09-18 (×2): 80 mg via INTRAVENOUS
  Filled 2020-09-17 (×2): qty 8

## 2020-09-17 NOTE — Progress Notes (Signed)
eLink Physician-Brief Progress Note Patient Name: Jesse Lowe DOB: 1951/02/12 MRN: [id number]   Date of Service  09/17/2020  HPI/Events of Note  K+ 3.4, patient is receiving oral K+ replacement.  eICU Interventions  KCL 10 meq iv Q 1 hour x 2 ordered.        Jesse Lowe 09/17/2020, 6:20 AM

## 2020-09-17 NOTE — Progress Notes (Signed)
NAME:  Jesse Lowe, MRN:  [id number], DOB:  03-02-1951, LOS: 11 ADMISSION DATE:  08/30/2020, CONSULTATION DATE:  6/20 REFERRING MD:  Jacinto Halim, CHIEF COMPLAINT:  delirium and respiratory distress    History of Present Illness:  70 yr old male admitted with anterior wall STEMI .went to cath lab emergently. Found to have lesion in LAD and high grade stenosis of the RCA and Circ. Underwent successful stent of LAD. And IAB placement. He was weaned off pressors. IABP dc'd am 6/20. Progressively more confused over course of day (had received Ambien that evening) 6/20 w/ increased WOB and agitation. PCCM consulted that afternoon for agitated delirium.   Pertinent  Medical History  HTN and HL Had not seen MD in 10-15 yrs.  Significant Hospital Events: Including procedures, antibiotic start and stop dates in addition to other pertinent events   6/19 admitted w/ STEMI. While waiting had VF arrest req'd ACLS w/ defib, amio epi; estimated 5 minutes to ROSC. Was awake and alert after ROSC obtained. Went to cath lab emergently. Found to have lesion in LAD and high grade stenosis of the RCA and Circ. Underwent successful stent of LAD. And IAB placement 6/20 off IABP. Pressors off. Progressive agitation and WOB. Intubated PCCM asked to consult 6/24 reintubated for shock, milrinone restarted Echo 09/07/2020 > Left ventricular ejection fraction, by estimation, is 20 to 25%. The left ventricle has severely decreased function. Left ventricular diastolic parameters were normal. There is akinesis of the left ventricular, entire anterior wall, anterolateral wall and apical Segment, mildly elevated pulmonary artery systolic pressure. The estimated right ventricular systolic pressure is 33.9 mmHg. Left atrial size was mildly dilated. 6/28 weaning sedation. Bronched with copious secretions, some hemoptysis seemed like from suction trauma 6/29 incr sedation. Having high RR with desats. Worse rhonchi, new wheeze. Adding  aggressive lasix. Adding versed gtt and gaba   Interim History / Subjective:  On fent 1mg  versed Off NE Still on milrinone  Had robust UOP after lasix 80mg  q8   Objective   Blood pressure 113/69, pulse 88, temperature 99.32 F (37.4 C), resp. rate (!) 25, height 5\' 9"  (1.753 m), weight 82.1 kg, SpO2 100 %. CVP:  [5 mmHg-8 mmHg] 8 mmHg  Vent Mode: PRVC FiO2 (%):  [40 %-100 %] 40 % Set Rate:  [22 bmp] 22 bmp Vt Set:  [490 mL] 490 mL PEEP:  [8 cmH20] 8 cmH20 Plateau Pressure:  [15 cmH20-25 cmH20] 22 cmH20   Intake/Output Summary (Last 24 hours) at 09/17/2020 0958 Last data filed at 09/17/2020 0800 Gross per 24 hour  Intake 2191.98 ml  Output 5160 ml  Net -2968.02 ml   Filed Weights   09/15/20 0404 09/16/20 0500 09/17/20 0500  Weight: 84.4 kg 83.9 kg 82.1 kg    Examination: General: Critically ill appearing older adult M intubated sedate NAD  Neuro: Sedated. Does not follow commands, grimaces and localizes to command  HEENT: NCAT pink mm anicteric sclera Ett secure  Cardiovascular: RRR s1s2 cap refill brisk  Lungs:bilateral rhonchi. Even, unlabored on PSV with elongated I time.  Abdomen: soft round ndnt + bowel sounds  Musculoskeletal: BUE BLE non-pitting edema. No acute joint deformity  Skin: pale c/d/w no rash   Assessment & Plan:   Acute metabolic encephalopathy  -? Etoh (no known hx of abuse/dependence and should be out of window now -- lends against etoh dts being culprit for worsening encephalopathy), shock/hypoperfusion, CNS depressing meds ? Delirium, infection -CT not c/w anoxic injury -ammonia normal  -  does have H flu PNA but not sure if infection explains encephalopathy  P RASS 0 to -1 Fent gtt. Changing versed gtt to prop  Cont Enteral seroquel and gabapentin   Acute hypoxic respiratory failure  H Flu PNA  Pulmonary edema - improving  -pulm edema, suspected aspiration, hemoptysis due to suction trauma -CXR 6/29 and 6/30 reviewed -- 6/30 with some  improvement in edema  P Abx de-escalation from vanc/cefepime to rocephin for Hflu PNA 80mg  Lasix BID + 5mg  metolazone  WUA/SBT 7/1 AM CXR   Brief VF arrest due to STEMI  STEMI s/p LAD stent Acute systolic HFrEF  -- EV approx 10%  Cardiogenic shock Hx CAD, involving RCA and LCX  P Lovenox, brilinta per cards Trend coox Off NE /30 Cont milrinone  Optimize fluid status   AKI -prior AKI w ATN improved -current AKI (6/30) in response to diuresis, predictable Cr bump  Volume overload  P Trend renal indices, UOP Lasix + metolazone   Constipation  P Continue aggressive bowel reg  Transaminitis, improving  PRN LFTs  Best Practice (right click and "Reselect all SmartList Selections" daily)   Diet/type: EN  Pain/Anxiety/Delirium protocol Yes and RASS goal: 0 to -1 VAP protocol (if indicated): Yes DVT prophylaxis: LMWH GI prophylaxis: PPI Glucose control:  SSI Central venous access:  Yes, and it is still needed Arterial line:  N/A Foley:  N/A and Yes, and it is still needed Mobility:  bed rest  PT consulted: N/A Code Status:  full code Last date of multidisciplinary goals of care discussion has 2 brothers in 9/1 and 03-22-1996. A coworker is working on [place] for family in [place] ([employer] and safety inspector can be reached on [phone]) In ccm conversation w Dr. Steward Drone 6/29, dr Jacinto Halim indicated pt previously has expressed being estranged from family friends and not having a person he would have/want to make medical decisions  Disposition: remains critically ill, will stay in intensive care   CRITICAL CARE Performed by: 7/29   Total critical care time: 40 minutes  Critical care time was exclusive of separately billable procedures and treating other patients. Critical care was necessary to treat or prevent imminent or life-threatening deterioration.  Critical care was time spent personally by me on the following activities: development of treatment plan with  patient and/or surrogate as well as nursing, discussions with consultants, evaluation of patient's response to treatment, examination of patient, obtaining history from patient or surrogate, ordering and performing treatments and interventions, ordering and review of laboratory studies, ordering and review of radiographic studies, pulse oximetry and re-evaluation of patient's condition.  Jacinto Halim MSN, AGACNP-BC [facility] Pulmonary/Critical Care Medicine Amion for pager  09/17/2020, 9:58 AM

## 2020-09-17 NOTE — Progress Notes (Signed)
I have placed a consult for ethics committee, Dr. Sunnie Nielsen reached out to me and gave me information regarding Jesse Lowe [phone].   I personally discussed with Ms. Jesse Lowe, according to [facility] board, patient who is unable to make any decisions, the attending physician can certainly make decisions for end-of-life issues or any medical care.  However if the physician feels the care is futile, he can be withdrawn from life support.  As he does have a brother that we have made contact, it would be appropriate for the brother to make a decision as well and to keep him in the loop.  Will continue to follow along and I will have multidisciplinary discussion regarding future care.   Yates Decamp, MD, [facility] 09/17/2020, 11:15 AM Office: [phone] Fax: [phone] Pager: [phone]

## 2020-09-17 NOTE — Plan of Care (Signed)
  Problem: Education: Goal: Knowledge of General Education information will improve Description: Including pain rating scale, medication(s)/side effects and non-pharmacologic comfort measures Outcome: Progressing   Problem: Health Behavior/Discharge Planning: Goal: Ability to manage health-related needs will improve Outcome: Progressing   Problem: Clinical Measurements: Goal: Ability to maintain clinical measurements within normal limits will improve Outcome: Progressing Goal: Will remain free from infection Outcome: Progressing Goal: Diagnostic test results will improve Outcome: Progressing Goal: Respiratory complications will improve Outcome: Progressing Goal: Cardiovascular complication will be avoided Outcome: Progressing   Problem: Activity: Goal: Risk for activity intolerance will decrease Outcome: Progressing   Problem: Nutrition: Goal: Adequate nutrition will be maintained Outcome: Progressing   Problem: Coping: Goal: Level of anxiety will decrease Outcome: Progressing   Problem: Elimination: Goal: Will not experience complications related to bowel motility Outcome: Progressing Goal: Will not experience complications related to urinary retention Outcome: Progressing   Problem: Pain Managment: Goal: General experience of comfort will improve Outcome: Progressing   Problem: Safety: Goal: Ability to remain free from injury will improve Outcome: Progressing   Problem: Skin Integrity: Goal: Risk for impaired skin integrity will decrease Outcome: Progressing   Problem: Safety: Goal: Non-violent Restraint(s) Outcome: Progressing   Problem: Education: Goal: Ability to demonstrate management of disease process will improve Outcome: Progressing Goal: Ability to verbalize understanding of medication therapies will improve Outcome: Progressing Goal: Individualized Educational Video(s) Outcome: Progressing   Problem: Activity: Goal: Capacity to carry out  activities will improve Outcome: Progressing   Problem: Cardiac: Goal: Ability to achieve and maintain adequate cardiopulmonary perfusion will improve Outcome: Progressing   Problem: Activity: Goal: Ability to tolerate increased activity will improve Outcome: Progressing

## 2020-09-17 NOTE — Progress Notes (Signed)
Subjective:   Patient remains intubated, uneventful last night.   Intake/Output from previous day:  I/O last 3 completed shifts: In: 2875.1 [I.V.:778.5; OI/TG:5498.2; IV Piggyback:649.9] Out: 5395 [Urine:5395] Total I/O In: -  Out: 525 [Urine:525]  Blood pressure 113/69, pulse 88, temperature 99.32 F (37.4 C), resp. rate (!) 25, height 5' 9"  (1.753 m), weight 82.1 kg, SpO2 100 %.  Vitals with BMI 09/17/2020 09/17/2020 09/17/2020  Height - - -  Weight - - -  BMI - - -  Systolic 641 583 094  Diastolic 69 65 68  Pulse 88 85 86     Physical Exam Vitals reviewed.  Constitutional:      General: He is not in acute distress.    Appearance: He is ill-appearing. He is not diaphoretic.     Interventions: He is sedated and intubated.  HENT:     Head: Normocephalic and atraumatic.  Neck:     Vascular: No carotid bruit.     Comments: Could not evaluate JVD. Cardiovascular:     Rate and Rhythm: Regular rhythm. Tachycardia present.     Pulses: Normal pulses and intact distal pulses.     Heart sounds: S1 normal and S2 normal. No murmur heard.   No gallop.  Pulmonary:     Effort: No respiratory distress. He is intubated.     Breath sounds: Normal breath sounds and air entry. No wheezing, rhonchi or rales (Bilateral diffuse and extensive).  Abdominal:     General: Bowel sounds are normal. There is no distension.  Musculoskeletal:     Right lower leg: No edema.     Left lower leg: No edema.  Skin:    General: Skin is warm.     Capillary Refill: Capillary refill takes less than 2 seconds.  5.  Lab Results:  BNP (last 3 results) Recent Labs    09/11/20 0320 09/12/20 0433 09/13/20 0203  BNP 2,312.1* 2,242.1* 1,687.7*   ProBNP (last 3 results) No results for input(s): PROBNP in the last 8760 hours. BMP Latest Ref Rng & Units 09/17/2020 09/16/2020 09/16/2020  Glucose 70 - 99 mg/dL 192(H) 163(H) 145(H)  BUN 8 - 23 mg/dL 53(H) 41(H) 34(H)  Creatinine 0.61 - 1.24 mg/dL 1.86(H)  1.51(H) 1.23  Sodium 135 - 145 mmol/L 147(H) 149(H) 147(H)  Potassium 3.5 - 5.1 mmol/L 3.4(L) 5.3(H) 4.6  Chloride 98 - 111 mmol/L 112(H) 119(H) 120(H)  CO2 22 - 32 mmol/L 24 22 23   Calcium 8.9 - 10.3 mg/dL 8.5(L) 8.2(L) 8.5(L)   Hepatic Function Latest Ref Rng & Units 09/17/2020 09/16/2020 09/12/2020  Total Protein 6.5 - 8.1 g/dL - - 6.1(L)  Albumin 3.5 - 5.0 g/dL 2.2(L) 2.1(L) 2.8(L)  AST 15 - 41 U/L - - 91(H)  ALT 0 - 44 U/L - - 129(H)  Alk Phosphatase 38 - 126 U/L - - 93  Total Bilirubin 0.3 - 1.2 mg/dL - - 0.8  Bilirubin, Direct 0.0 - 0.2 mg/dL - - -   CBC Latest Ref Rng & Units 09/17/2020 09/16/2020 09/15/2020  WBC 4.0 - 10.5 K/uL 12.1(H) 10.4 11.0(H)  Hemoglobin 13.0 - 17.0 g/dL 10.5(L) 11.7(L) 10.2(L)  Hematocrit 39.0 - 52.0 % 33.9(L) 37.4(L) 33.0(L)  Platelets 150 - 400 K/uL 405(H) 343 371   Lipid Panel     Component Value Date/Time   CHOL 182 08/23/2020 1551   TRIG 225 (H) 09/16/2020 1551   HDL 31 (L) 08/27/2020 1551   CHOLHDL 5.9 09/15/2020 1551   VLDL 45 (H) 08/23/2020 1551  [place] 106 (H) 09/12/2020 1551    HEMOGLOBIN A1C Lab Results  Component Value Date   HGBA1C 5.3 09/07/2020   MPG 105 09/07/2020   TSH Recent Labs    08/20/2020 1859  TSH 2.235   BNP (last 3 results) Recent Labs    09/11/20 0320 09/12/20 0433 09/13/20 0203  BNP 2,312.1* 2,242.1* 1,687.7*    ProBNP (last 3 results) No results for input(s): PROBNP in the last 8760 hours.  Imaging: Portable chest x-ray 09/09/2020:  Central lines and tubes in stable position.  Cardiomegaly again noted.  Bilateral pulmonary infiltrates/edema again noted.  Findings suggestive of CHF with bilateral pleural effusion.  DG Chest X-ray 09/08/2020:  Cardiac shadow is within normal limits. Prior coronary stenting is seen. Intra-aortic balloon pump is noted just below the aortic knob. Lungs are well aerated bilaterally with mild interstitial edema.  IMPRESSION: Mild interstitial edema. Balloon pump in  satisfactory position.  Cardiac Studies: Left heart catheterization 08/23/2020: LV 56/15, EDP 21 mmHg.  Aorta 75/44, mean 57 mmHg.  No pressure gradient across the aortic valve.  Markedly elevated EDP. LM: Large vessel, bifurcates into circumflex and LAD. LAD: Has ulcerated subtotally occluded proximal LAD stenosis that extends to the mid segment.  There is TIMI I flow also in the D1 and D2.  Moderate-sized D1 and small sized D2.  Mild disease in the mid to distal LAD. CX: Moderate sized vessel, gives origin to a large OM1, OM 2 is very small, there is a lesion after OM 2 at 80% focal lesion.  OM 3 is moderate sized with secondary branches. RCA: Dominant.  Proximal segment 90% stenosis, tandem 30 to 40% stenosis in the midsegment followed by a high-grade 90% stenosis.  Mild disease in the distal right coronary.   Intervention: Successful 2 overlapping stent implantation with 3.0 x 24 mm followed by 3.0 x 16 mm Synergy XD stents, stenosis reduced from 99% to 0%, TIMI I improved to TIMI-3 flow.   Recommendation: Patient will need relook at the LAD stent in view of severe spasm and cardiogenic shock and need for pressor support in the cardiac catheterization lab.  Patient was started on Levophed due to low blood pressure and intra-aortic balloon pump was inserted percutaneously prior to angioplasty.  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  115 mL contrast utilized.  Echocardiogram 09/16/2020:  1. Left ventricular ejection fraction, by estimation, is 20 to 25%. Left ventricular ejection fraction by PLAX is 35 %. The left ventricle has severely decreased function. The left ventricle demonstrates regional wall motion abnormalities (see scoring diagram/findings for description). Left ventricular diastolic function could not be evaluated. There is akinesis of the left ventricular, entire  anterolateral wall, anterior wall and apical segment. There is akinesis of the left ventricular, entire apical segment.  2. Right ventricular systolic function was not well visualized. The right ventricular size is normal.  3. The mitral valve is normal in structure. No evidence of mitral valve regurgitation. No evidence of mitral stenosis.  4. The aortic valve is normal in structure. Aortic valve regurgitation is not visualized. No aortic stenosis is present.  5. There is mild dilatation of the aortic root, measuring 39 mm.  6. The inferior vena cava is dilated in size with <50% respiratory variability, suggesting right atrial pressure of 15 mmHg.   Comparison(s): Compared to 09/11/2020, LVEF is improved marginally from 10 to  15% to the present 25% to 30%.  EKG:   EKG 09/10/2020: Normal sinus rhythm at rate of 83 bpm, normal axis, poor R wave progression, cannot exclude anteroseptal infarct old.  Nonspecific ST-T abnormality.  Compared to 09/15/2020, minimal anterolateral ST elevation not present.  08/27/2020: 1557 hrs.: Ventricular fibrillation. 09/17/2020 at 1550 hrs.: Anteroseptal and high lateral STEMI.   Telemetry: Sinus rhythm with runs of nonsustained ventricular tachycardia 6-8 beats  Scheduled Meds:  aspirin  81 mg Per Tube Daily   atorvastatin  80 mg Per Tube Daily   chlorhexidine gluconate (MEDLINE KIT)  15 mL Mouth Rinse BID   Chlorhexidine Gluconate Cloth  6 each Topical Daily   docusate  100 mg Per Tube BID   enoxaparin (LOVENOX) injection  40 mg Subcutaneous Q24H   feeding supplement (PROSource TF)  45 mL Per Tube BID   fentaNYL (SUBLIMAZE) injection  25 mcg Intravenous Once   folic acid  1 mg Per Tube Daily   furosemide  80 mg Intravenous BID   gabapentin  100 mg Per Tube Q8H   insulin aspart  0-15 Units Subcutaneous Q4H   ivabradine  7.5 mg Per Tube BID WC   mouth rinse  15 mL Mouth Rinse 10 times per day   metoCLOPramide  10 mg Per Tube BID   multivitamin with  minerals  1 tablet Per Tube Daily   pantoprazole (PROTONIX) IV  40 mg Intravenous Daily   polyethylene glycol  17 g Per Tube Daily   potassium chloride  40 mEq Per Tube BID   potassium chloride  40 mEq Per Tube Once   QUEtiapine  25 mg Per Tube BID   sodium chloride flush  10-40 mL Intracatheter Q12H   sodium chloride flush  3 mL Intravenous Q12H   thiamine injection  100 mg Intravenous Daily   ticagrelor  90 mg Per Tube BID   Continuous Infusions:  sodium chloride 10 mL/hr at 09/17/20 0500   feeding supplement (VITAL 1.5 CAL) 60 mL/hr at 09/16/20 2253   fentaNYL infusion INTRAVENOUS 50 mcg/hr (09/17/20 0600)   midazolam 1 mg/hr (09/17/20 0600)   milrinone 0.25 mcg/kg/min (09/17/20 0600)   norepinephrine (LEVOPHED) Adult infusion 3 mcg/min (09/17/20 0600)   potassium chloride 10 mEq (09/17/20 0746)   propofol (DIPRIVAN) infusion 5 mcg/kg/min (09/17/20 0818)   PRN Meds:.acetaminophen (TYLENOL) oral liquid 160 mg/5 mL, albuterol, bisacodyl, fentaNYL, midazolam, midazolam, nitroGLYCERIN, ondansetron (ZOFRAN) IV, sodium chloride flush, sodium chloride flush  Assessment/Plan:   1.  Cardiogenic shock.  2.  Heart failure with reduced ejection fraction,  Ischemic cardiomyopathy 3.  Acute renal failure secondary to ATN from cardiogenic shock.  4. Altered mental status,  out of proportion to anoxic brain injury now emparically being treated for EtOH withdrawal.  Recommendation:   Patient is still volume overloaded and requiring ventilatory support, has not been able to tolerate weaning him off of sedatives.  Continues to be confused.  He has had good urinary output, will continue furosemide 80 mg 3 times daily today, follow-up on BNP today and tomorrow as well.  Coox remain stable.  Continue Milrinone for now until fluid status is stable. Echo reveals slight improvement in LVEF.  When I first met him, he had stated no one to be contacted and that he has no friends that I could call and he  was alone. Hence I am comfortable to make decisions regarding his care along with my colleagues involved in his care.  If he does not recover  in the next 2-3 days, we may have to consider trach vs evaluation by Ethics committee regarding making end of life issue discussions.  I will get this process started.   35 minutes of critical care time   Adrian Prows, MD, [facility] 09/17/2020, 8:27 AM Office: [phone] Fax: [phone] Pager: [phone]

## 2020-09-18 ENCOUNTER — Inpatient Hospital Stay (HOSPITAL_COMMUNITY): Payer: Medicare Other

## 2020-09-18 LAB — CULTURE, RESPIRATORY W GRAM STAIN: Culture: NORMAL

## 2020-09-18 LAB — RENAL FUNCTION PANEL
Albumin: 2.1 g/dL — ABNORMAL LOW (ref 3.5–5.0)
Albumin: 2.1 g/dL — ABNORMAL LOW (ref 3.5–5.0)
Anion gap: 13 (ref 5–15)
Anion gap: 14 (ref 5–15)
BUN: 77 mg/dL — ABNORMAL HIGH (ref 8–23)
BUN: 93 mg/dL — ABNORMAL HIGH (ref 8–23)
CO2: 23 mmol/L (ref 22–32)
CO2: 25 mmol/L (ref 22–32)
Calcium: 8.1 mg/dL — ABNORMAL LOW (ref 8.9–10.3)
Calcium: 8.3 mg/dL — ABNORMAL LOW (ref 8.9–10.3)
Chloride: 112 mmol/L — ABNORMAL HIGH (ref 98–111)
Chloride: 110 mmol/L (ref 98–111)
Creatinine, Ser: 2.43 mg/dL — ABNORMAL HIGH (ref 0.61–1.24)
Creatinine, Ser: 2.8 mg/dL — ABNORMAL HIGH (ref 0.61–1.24)
GFR, Estimated: 28 mL/min — ABNORMAL LOW (ref >60)
GFR, Estimated: 24 mL/min — ABNORMAL LOW (ref >60)
Glucose, Bld: 193 mg/dL — ABNORMAL HIGH (ref 70–99)
Glucose, Bld: 184 mg/dL — ABNORMAL HIGH (ref 70–99)
Phosphorus: 5.1 mg/dL — ABNORMAL HIGH (ref 2.5–4.6)
Phosphorus: 5.8 mg/dL — ABNORMAL HIGH (ref 2.5–4.6)
Potassium: 4.4 mmol/L (ref 3.5–5.1)
Potassium: 4.4 mmol/L (ref 3.5–5.1)
Sodium: 148 mmol/L — ABNORMAL HIGH (ref 135–145)
Sodium: 149 mmol/L — ABNORMAL HIGH (ref 135–145)

## 2020-09-18 LAB — COOXEMETRY PANEL
Carboxyhemoglobin: 0.8 % (ref 0.5–1.5)
Carboxyhemoglobin: 0.9 % (ref 0.5–1.5)
Methemoglobin: 1.1 % (ref 0.0–1.5)
Methemoglobin: 1.1 % (ref 0.0–1.5)
O2 Saturation: 87 %
O2 Saturation: 80 %
Total hemoglobin: 10.9 g/dL — ABNORMAL LOW (ref 12.0–16.0)
Total hemoglobin: 10.8 g/dL — ABNORMAL LOW (ref 12.0–16.0)

## 2020-09-18 LAB — CBC
HCT: 33.4 % — ABNORMAL LOW (ref 39.0–52.0)
Hemoglobin: 10.5 g/dL — ABNORMAL LOW (ref 13.0–17.0)
MCH: 31.2 pg (ref 26.0–34.0)
MCHC: 31.4 g/dL (ref 30.0–36.0)
MCV: 99.1 fL (ref 80.0–100.0)
Platelets: 501 10*3/uL — ABNORMAL HIGH (ref 150–400)
RBC: 3.37 MIL/uL — ABNORMAL LOW (ref 4.22–5.81)
RDW: 14.5 % (ref 11.5–15.5)
WBC: 13.9 10*3/uL — ABNORMAL HIGH (ref 4.0–10.5)
nRBC: 0 % (ref 0.0–0.2)

## 2020-09-18 LAB — GLUCOSE, CAPILLARY
Glucose-Capillary: 137 mg/dL — ABNORMAL HIGH (ref 70–99)
Glucose-Capillary: 141 mg/dL — ABNORMAL HIGH (ref 70–99)
Glucose-Capillary: 151 mg/dL — ABNORMAL HIGH (ref 70–99)
Glucose-Capillary: 153 mg/dL — ABNORMAL HIGH (ref 70–99)
Glucose-Capillary: 162 mg/dL — ABNORMAL HIGH (ref 70–99)
Glucose-Capillary: 164 mg/dL — ABNORMAL HIGH (ref 70–99)

## 2020-09-18 LAB — TRIGLYCERIDES: Triglycerides: 369 mg/dL — ABNORMAL HIGH (ref <150)

## 2020-09-18 LAB — BRAIN NATRIURETIC PEPTIDE: B Natriuretic Peptide: 2719 pg/mL — ABNORMAL HIGH (ref 0.0–100.0)

## 2020-09-18 MED ORDER — FUROSEMIDE 10 MG/ML IJ SOLN: 80.0000 mg | Freq: Every day | INTRAMUSCULAR | Status: DC

## 2020-09-18 MED ORDER — SODIUM CHLORIDE 3 % IN NEBU
4.0000 mL | INHALATION_SOLUTION | Freq: Once | RESPIRATORY_TRACT | Status: DC | PRN
  Filled 2020-09-18: qty 4

## 2020-09-18 MED ORDER — ENOXAPARIN SODIUM 30 MG/0.3ML IJ SOSY
30.0000 mg | PREFILLED_SYRINGE | INTRAMUSCULAR | Status: DC
  Administered 2020-09-19 – 2020-09-22 (×4): 30 mg via SUBCUTANEOUS
  Filled 2020-09-18 (×4): qty 0.3

## 2020-09-18 NOTE — Progress Notes (Signed)
D/w Rrt Selena Batten -- desat, incr RR -- placed back on PRVC. Difficult to suction. Likely a plug  Will try hypertonic neb, very well may need bronch hypertonic + pulm hygiene not successful    Tessie Fass MSN, AGACNP-BC [facility] Pulmonary/Critical Care Medicine 09/18/2020, 12:16 PM

## 2020-09-18 NOTE — Progress Notes (Addendum)
NAME:  Jesse Lowe, MRN:  [id number], DOB:  [birth year], LOS: 12 ADMISSION DATE:  09/07/2020, CONSULTATION DATE:  6/20 REFERRING MD:  Jacinto Halim, CHIEF COMPLAINT:  delirium and respiratory distress    History of Present Illness:  70 yr old male admitted with anterior wall STEMI .went to cath lab emergently. Found to have lesion in LAD and high grade stenosis of the RCA and Circ. Underwent successful stent of LAD. And IAB placement. He was weaned off pressors. IABP dc'd am 6/20. Progressively more confused over course of day (had received Ambien that evening) 6/20 w/ increased WOB and agitation. PCCM consulted that afternoon for agitated delirium.   Pertinent  Medical History  HTN and HL Had not seen MD in 10-15 yrs.  Significant Hospital Events: Including procedures, antibiotic start and stop dates in addition to other pertinent events   6/19 admitted w/ STEMI. While waiting had VF arrest req'd ACLS w/ defib, amio epi; estimated 5 minutes to ROSC. Was awake and alert after ROSC obtained. Went to cath lab emergently. Found to have lesion in LAD and high grade stenosis of the RCA and Circ. Underwent successful stent of LAD. And IAB placement 6/20 off IABP. Pressors off. Progressive agitation and WOB. Intubated PCCM asked to consult 6/24 reintubated for shock, milrinone restarted Echo 09/07/2020 > Left ventricular ejection fraction, by estimation, is 20 to 25%. The left ventricle has severely decreased function. Left ventricular diastolic parameters were normal. There is akinesis of the left ventricular, entire anterior wall, anterolateral wall and apical Segment, mildly elevated pulmonary artery systolic pressure. The estimated right ventricular systolic pressure is 33.9 mmHg. Left atrial size was mildly dilated. 6/28 weaning sedation. Bronched with copious secretions, some hemoptysis seemed like from suction trauma 6/29 incr sedation. Having high RR with desats. Worse rhonchi, new wheeze. Adding  aggressive lasix. Adding versed gtt and gaba  6/30 versed changed to prop. Back on NE 7/1 weaning sedation and NE   Interim History / Subjective:  Sedation has increased significantly yesterday and overnight-- in parallel with this, NE has also been restarted and has increased (pt was fent 1mg  versed 0 NE. Now 150 mcg fent 30 prop 28 NE) -- though MAPs are well above target and NE has room for wean   Cr continues to rise  Coox 87%   Trigs 369   Objective   Blood pressure 130/80, pulse (!) 101, temperature 98.2 F (36.8 C), resp. rate 20, height 5\' 9"  (1.753 m), weight 82.1 kg, SpO2 96 %. CVP:  [7 mmHg-21 mmHg] 19 mmHg  Vent Mode: PRVC FiO2 (%):  [40 %-50 %] 50 % Set Rate:  [22 bmp] 22 bmp Vt Set:  [490 mL] 490 mL PEEP:  [8 cmH20] 8 cmH20 Plateau Pressure:  [16 cmH20-22 cmH20] 18 cmH20   Intake/Output Summary (Last 24 hours) at 09/18/2020 0710 Last data filed at 09/18/2020 0400 Gross per 24 hour  Intake 2573.79 ml  Output 2600 ml  Net -26.21 ml   Filed Weights   09/15/20 0404 09/16/20 0500 09/17/20 0500  Weight: 84.4 kg 83.9 kg 82.1 kg    Examination: General: Critically and chronically ill appearing M, appears older than stated age, intubated sedated NAD  Neuro: sedated, grimaces to pain. Does not follow commands  HEENT: NCAT pink mm anicteric sclera  Cardiovascular: rrr s1s2 cap refill < 3 sec.  Lungs: rhonchi R>L. Mechanically ventilated. Increased I time on PSV  Abdomen: soft round ndnt + bowel sounds  Musculoskeletal:  no acute deformity  no cyanosis or clubbing  Skin: pale c/d/w   Assessment & Plan:   Acute metabolic encephalopathy  -? Etoh (no known hx of abuse/dependence and should be out of window now -- lends against etoh dts being culprit for worsening encephalopathy), shock/hypoperfusion, CNS depressing meds ? Delirium, infection -CT not c/w anoxic injury -ammonia normal  -does have H flu PNA but not sure if infection explains encephalopathy  P RASS 0  to -1 Prop fent, wean as able -- I will put a lower ceiling on the prp Cont Enteral seroquel and gabapentin   Acute hypoxic respiratory failure H Flu PNA Pulm edema - improving  -pulm edema, suspected aspiration, hemoptysis due to suction trauma -CXR 6/29 and 6/30 reviewed -- 6/30 with some improvement in edema  P Rocephin for Hflu PNA Placed on PSV -- he has a long itime, lots of vent alarming on PRVC which led to significant uptitration in sedation and thus pressors. Getting good minute ventilation on current mode, will keep. If need to change could do SIMV  VAP, PAD WUA/SBT Might end up needing trach   VF arrest due to STEMI  STEMI, s/p LAD stent Acute systolic HFrEF Cardiogenic shock + vasodilatory shock related to medications  Hx CAD involving RCA LCX  P Lovenox, brilinta per cards Trend coox -- 85 on 7/1  NE, milrinone -- trying to wean NE with prop wean. Milrinone per cards  Possible swan per cards   AKI - -prior AKI w ATN improved -current AKI in response to diuresis, predictable Cr bump  Volume overload - improving  P Trend renal indices, UOP Likely that Cr will equilibrate as diuresis is de-escalated, he is producing good urine still and I think this Cr bump is just due to lasix Will de-escalate to 80mg  qD   Constipation  Continue aggressive bowel reg  Transaminitis, improving  PRN LFTs  Best Practice (right click and "Reselect all SmartList Selections" daily)   Diet/type: EN  Pain/Anxiety/Delirium protocol Yes and RASS goal: 0 to -1 VAP protocol (if indicated): Yes DVT prophylaxis: LMWH GI prophylaxis: PPI Glucose control:  SSI Central venous access:  Yes, and it is still needed Arterial line:  N/A Foley:  N/A and Yes, and it is still needed Mobility:  bed rest  PT consulted: N/A Code Status:  full code Last date of multidisciplinary goals of care discussion has 2 brothers in and [place]. A coworker is working on [place] for family  in Health and safety inspector ([employer] [place] can be reached on [phone]) In ccm conversation w Dr. [phone] 6/29, dr 7/29 indicated pt previously has expressed being estranged from family friends and not having a person he would have/want to make medical decisions  Disposition: remains critically ill, will stay in intensive care   CRITICAL CARE Performed by: Jacinto Halim   Total critical care time: 41 minutes  Critical care time was exclusive of separately billable procedures and treating other patients. Critical care was necessary to treat or prevent imminent or life-threatening deterioration.  Critical care was time spent personally by me on the following activities: development of treatment plan with patient and/or surrogate as well as nursing, discussions with consultants, evaluation of patient's response to treatment, examination of patient, obtaining history from patient or surrogate, ordering and performing treatments and interventions, ordering and review of laboratory studies, ordering and review of radiographic studies, pulse oximetry and re-evaluation of patient's condition.  Lanier Clam MSN, AGACNP-BC [place] Pul[facility]/Critical Care Medicine Amion for pager  09/18/2020, 7:10  AM

## 2020-09-18 NOTE — Progress Notes (Signed)
Subjective:   Patient remains intubated, uneventful last night.   Intake/Output from previous day:  I/O last 3 completed shifts: In: 3547.4 [I.V.:1251.2; NG/GT:1996.2; IV Piggyback:300.1] Out: 6834 [Urine:5860] Total I/O In: 244.6 [I.V.:244.6] Out: 185 [Urine:185]  Blood pressure 113/67, pulse 98, temperature 99.5 F (37.5 C), resp. rate (!) 29, height 5' 9" (1.753 m), weight 80.8 kg, SpO2 97 %.  Vitals with BMI 09/18/2020 09/18/2020 09/18/2020  Height - - -  Weight - 178 lbs 2 oz -  BMI - 19.62 -  Systolic 229 798 921  Diastolic 67 80 71  Pulse 98 101 102     Physical Exam Vitals reviewed.  Constitutional:      General: He is not in acute distress.    Appearance: He is ill-appearing. He is not diaphoretic.     Interventions: He is sedated and intubated.  HENT:     Head: Normocephalic and atraumatic.  Neck:     Vascular: No carotid bruit.     Comments: Could not evaluate JVD. Cardiovascular:     Rate and Rhythm: Regular rhythm. Tachycardia present.     Pulses: Normal pulses and intact distal pulses.     Heart sounds: S1 normal and S2 normal. No murmur heard.   No gallop.  Pulmonary:     Effort: No respiratory distress. He is intubated.     Breath sounds: Normal breath sounds and air entry. No wheezing, rhonchi or rales (Bilateral diffuse and extensive).  Abdominal:     General: Bowel sounds are normal. There is no distension.  Musculoskeletal:     Right lower leg: No edema.     Left lower leg: No edema.  Skin:    General: Skin is warm.     Capillary Refill: Capillary refill takes less than 2 seconds.  5.  Lab Results:  BNP (last 3 results) Recent Labs    09/11/20 0320 09/12/20 0433 09/13/20 0203  BNP 2,312.1* 2,242.1* 1,687.7*    ProBNP (last 3 results) No results for input(s): PROBNP in the last 8760 hours. BMP Latest Ref Rng & Units 09/18/2020 09/17/2020 09/17/2020  Glucose 70 - 99 mg/dL 193(H) 197(H) 192(H)  BUN 8 - 23 mg/dL 77(H) 66(H) 53(H)   Creatinine 0.61 - 1.24 mg/dL 2.43(H) 2.24(H) 1.86(H)  Sodium 135 - 145 mmol/L 148(H) 149(H) 147(H)  Potassium 3.5 - 5.1 mmol/L 4.4 4.6 3.4(L)  Chloride 98 - 111 mmol/L 112(H) 114(H) 112(H)  CO2 22 - 32 mmol/L _0 Calcium 8.9 - 10.3 mg/dL 8.1(L) 8.2(L) 8.5(L)   Hepatic Function Latest Ref Rng & Units 09/18/2020 09/17/2020 09/17/2020  Total Protein 6.5 - 8.1 g/dL - - -  Albumin 3.5 - 5.0 g/dL 2.1(L) 2.2(L) 2.2(L)  AST 15 - 41 U/L - - -  ALT 0 - 44 U/L - - -  Alk Phosphatase 38 - 126 U/L - - -  Total Bilirubin 0.3 - 1.2 mg/dL - - -  Bilirubin, Direct 0.0 - 0.2 mg/dL - - -   CBC Latest Ref Rng & Units 09/18/2020 09/17/2020 09/16/2020  WBC 4.0 - 10.5 K/uL 13.9(H) 12.1(H) 10.4  Hemoglobin 13.0 - 17.0 g/dL 10.5(L) 10.5(L) 11.7(L)  Hematocrit 39.0 - 52.0 % 33.4(L) 33.9(L) 37.4(L)  Platelets 150 - 400 K/uL 501(H) 405(H) 343   Lipid Panel     Component Value Date/Time   CHOL 182 09/04/2020 1551   TRIG 369 (H) 09/18/2020 0342   HDL 31 (L) 08/31/2020 1551   CHOLHDL 5.9 08/22/2020 1551   VLDL 45 (  H) 08/23/2020 1551   LDLCALC 106 (H) 08/29/2020 1551    HEMOGLOBIN A1C Lab Results  Component Value Date   HGBA1C 5.3 09/07/2020   MPG 105 09/07/2020   TSH Recent Labs    09/05/2020 1859  TSH 2.235    BNP (last 3 results) Recent Labs    09/11/20 0320 09/12/20 0433 09/13/20 0203  BNP 2,312.1* 2,242.1* 1,687.7*     ProBNP (last 3 results) No results for input(s): PROBNP in the last 8760 hours.  Imaging: Portable chest x-ray 09/09/2020:  Central lines and tubes in stable position.  Cardiomegaly again noted.  Bilateral pulmonary infiltrates/edema again noted.  Findings suggestive of CHF with bilateral pleural effusion.  DG Chest X-ray 09/05/2020:  Cardiac shadow is within normal limits. Prior coronary stenting is seen. Intra-aortic balloon pump is noted just below the aortic knob. Lungs are well aerated bilaterally with mild interstitial edema.  IMPRESSION: Mild interstitial  edema. Balloon pump in satisfactory position.  Cardiac Studies: Left heart catheterization 09/11/2020: LV 56/15, EDP 21 mmHg.  Aorta 75/44, mean 57 mmHg.  No pressure gradient across the aortic valve.  Markedly elevated EDP. LM: Large vessel, bifurcates into circumflex and LAD. LAD: Has ulcerated subtotally occluded proximal LAD stenosis that extends to the mid segment.  There is TIMI I flow also in the D1 and D2.  Moderate-sized D1 and small sized D2.  Mild disease in the mid to distal LAD. CX: Moderate sized vessel, gives origin to a large OM1, OM 2 is very small, there is a lesion after OM 2 at 80% focal lesion.  OM 3 is moderate sized with secondary branches. RCA: Dominant.  Proximal segment 90% stenosis, tandem 30 to 40% stenosis in the midsegment followed by a high-grade 90% stenosis.  Mild disease in the distal right coronary.   Intervention: Successful 2 overlapping stent implantation with 3.0 x 24 mm followed by 3.0 x 16 mm Synergy XD stents, stenosis reduced from 99% to 0%, TIMI I improved to TIMI-3 flow.   Recommendation: Patient will need relook at the LAD stent in view of severe spasm and cardiogenic shock and need for pressor support in the cardiac catheterization lab.  Patient was started on Levophed due to low blood pressure and intra-aortic balloon pump was inserted percutaneously prior to angioplasty.  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  115 mL contrast utilized.  Echocardiogram 09/16/2020:  1. Left ventricular ejection fraction, by estimation, is 20 to 25%. Left ventricular ejection fraction by PLAX is 35 %. The left ventricle has severely decreased function. The left ventricle demonstrates regional wall motion abnormalities (see scoring diagram/findings for description). Left ventricular diastolic function could not be evaluated. There is akinesis of the  left ventricular, entire anterolateral wall, anterior wall and apical segment. There is akinesis of the left ventricular, entire apical segment.  2. Right ventricular systolic function was not well visualized. The right ventricular size is normal.  3. The mitral valve is normal in structure. No evidence of mitral valve regurgitation. No evidence of mitral stenosis.  4. The aortic valve is normal in structure. Aortic valve regurgitation is not visualized. No aortic stenosis is present.  5. There is mild dilatation of the aortic root, measuring 39 mm.  6. The inferior vena cava is dilated in size with <50% respiratory variability, suggesting right atrial pressure of 15 mmHg.   Comparison(s): Compared to 09/11/2020,  LVEF is improved marginally from 10 to 15% to the present 25% to 30%.  EKG:   EKG 09/10/2020: Normal sinus rhythm at rate of 83 bpm, normal axis, poor R wave progression, cannot exclude anteroseptal infarct old.  Nonspecific ST-T abnormality.  Compared to 09/15/2020, minimal anterolateral ST elevation not present.  09/03/2020: 1557 hrs.: Ventricular fibrillation. 09/11/2020 at 1550 hrs.: Anteroseptal and high lateral STEMI.   Telemetry: Sinus rhythm with runs of nonsustained ventricular tachycardia 6-8 beats  Scheduled Meds:  aspirin  81 mg Per Tube Daily   atorvastatin  80 mg Per Tube Daily   chlorhexidine gluconate (MEDLINE KIT)  15 mL Mouth Rinse BID   Chlorhexidine Gluconate Cloth  6 each Topical Q2000   docusate  100 mg Per Tube BID   enoxaparin (LOVENOX) injection  40 mg Subcutaneous Q24H   feeding supplement (PROSource TF)  45 mL Per Tube BID   fentaNYL (SUBLIMAZE) injection  25 mcg Intravenous Once   folic acid  1 mg Per Tube Daily   furosemide  80 mg Intravenous BID   gabapentin  100 mg Per Tube Q8H   insulin aspart  0-15 Units Subcutaneous Q4H   ivabradine  7.5 mg Per Tube BID WC   mouth rinse  15 mL Mouth Rinse 10 times per day   metoCLOPramide  10 mg Per Tube BID    multivitamin with minerals  1 tablet Per Tube Daily   pantoprazole (PROTONIX) IV  40 mg Intravenous Daily   polyethylene glycol  17 g Per Tube Daily   potassium chloride  40 mEq Per Tube BID   QUEtiapine  25 mg Per Tube BID   sodium chloride flush  10-40 mL Intracatheter Q12H   sodium chloride flush  3 mL Intravenous Q12H   thiamine injection  100 mg Intravenous Daily   ticagrelor  90 mg Per Tube BID   Continuous Infusions:  sodium chloride Stopped (09/18/20 0340)   cefTRIAXone (ROCEPHIN)  IV Stopped (09/17/20 1202)   feeding supplement (VITAL 1.5 CAL) 60 mL/hr at 09/16/20 2253   fentaNYL infusion INTRAVENOUS 150 mcg/hr (09/18/20 0800)   midazolam Stopped (09/17/20 0752)   milrinone 0.25 mcg/kg/min (09/18/20 0800)   norepinephrine (LEVOPHED) Adult infusion 22 mcg/min (09/18/20 0800)   propofol (DIPRIVAN) infusion 45 mcg/kg/min (09/18/20 0800)   PRN Meds:.acetaminophen (TYLENOL) oral liquid 160 mg/5 mL, albuterol, bisacodyl, fentaNYL, midazolam, midazolam, nitroGLYCERIN, ondansetron (ZOFRAN) IV, sodium chloride flush, sodium chloride flush  Assessment/Plan:   1.  Cardiogenic shock.  2.  Heart failure with reduced ejection fraction,  Ischemic cardiomyopathy 3.  Acute renal failure secondary to ATN from cardiogenic shock.  4. Altered mental status,  out of proportion to anoxic brain injury now emparically being treated for EtOH withdrawal.  Recommendation:   Patient is still volume overloaded and requiring ventilatory support, has not been able to tolerate weaning him off of sedatives.  Continues to be confused.  Is advising that he has not made any progress and with diuresis continues to have worsening renal function.  I will discuss with PCCM, may be will place a Swan-Ganz catheter to evaluate his cardiac output and also pulmonary capillary wedge pressure.  I had a long discussion with Ms. Rolanda  with regard to ethics committee stations.  At this time we will try to get  his brother to discuss regarding future care.  Otherwise continues to remain on Primacor and also Levophed, presently on fentanyl drip and also to prevent rapid and continues to be on the ventilator.  No change in physical exam.  35 minutes of critical care time   Adrian Prows, MD, [facility] 09/18/2020, 8:54 AM Office: [phone] Fax: [phone] Pager: [phone]

## 2020-09-18 NOTE — Progress Notes (Signed)
RT note-Patient had been on SBT, elevated HR, RR, and desaturation. Difficulty passing catheter down ETT. Lavage and aggressive suctioning performed, moderate amount of plugs and thick secretions, RN aware, continue to monitor.

## 2020-09-18 NOTE — Progress Notes (Signed)
CCM Interval Progress Note   70 yo admitted 6/19 with STEMI, did have brief cardiac arrest in this setting. Underwent LAD stent. Course c/b cardiogenic shock, required IABP which was dc 6/20, with ongoing pressor requirement. Respiratory failure with hypoxia due to pulm edema and H flu PNA, volume overload requiring diuresis with resulting AKI, and ongoing encephalopathy.  Today, have worked on Theatre stage manager and NE. Continues on Milrinone.  Was on PSV wean but had a mucus plug and is now back on PRVC and had a lavage with RRT.   I d/w Dr. Jacinto Halim regarding determining goals of care. From a cards standpoint, may not be much more to offer. Family does not have much by way of family/friends to aid in decision making (see Dr. Jacinto Halim notes RE ethics)   CXR with improving pulm edema  Coox 87 BNP has resulted since this morning, and is 2700 (after a few days of aggressive diuresis) Cr has bumped to 2.43 in this setting   Pt sedated NAD  He is newly following commands -- weakly opens eyes, sticks out tongue, follows bilateral hand and feet commands ETT secure, pink mmm, thick tan secretions Rrr cap refill < 3 sec  BUE BLE edema Foley with robust UOP Soft abdomen   Acute encephalopathy  Acute respiratory failure with hypoxia -pulm edema, Hflu PNA Shock -- likely multifactorial  S/p STEMI  AKI  Goals of Care Discussion: -I had d/w Dr. Jacinto Halim repeating a CT H to r/o intracranial process contributing to ongoing encephalopathy however neuro status seems to have made some improvement over the course of the day -- now following commands. Lends toward sedation related, ?ICU delirium. Of course also possible there are other factors, dose have an infectious.   -having a lot of mucus plugging. I wonder if moving toward an early trach would be in this pts best interest if aggressive interventions were pursued (think if we did trach he might move toward ATC fairly quickly, w aggressive  suctioning) -repeat echo hasn't had great recovery, however coox has globally improved. Remains on pressors, but wonder if current shock picture is multifactorial (infection, sedation) vs pure cardiogenic still  -Cr bump with aggressive diuresis for pulm edema. Still having good response to lasix, but Cr trend up. BNP is still elevated  suggesting pt needs more diuresis  -with multiple confounding problems, may need further workup/tx before determining possible care futility P -continue sedation wean, delirium precautions  -Dr. Denese Killings to bedside ultrasound, try to get an impression for volume status -- will dictate subsequent plans  -continue abx, efforts at vent wean. If care not felt to be futile, favor an early trach  -cont GOC discussions. Pt does have a brother who has largely deferred decisions to medical team -- still communicate w him RE any procedures / changes to tx plans      Additional CCT:  42 min    Tessie Fass MSN, AGACNP-BC [facility] Pulmonary/Critical Care Medicine [phone] If no answer, [phone] 09/18/2020, 2:06 PM

## 2020-09-18 NOTE — Progress Notes (Addendum)
When this RN came onto shift, pt's Milrinone was going at 0.125 mcg/kg/min when ordered dose is 0.25 mcg/kg/min. Per dayshift RN, was titrated down by one of the doctors during day shift due to pt's co-ox. The order was not updated. Paged Ganji MD to confirm milrinone rate and got verbal order to let it stay at 0.125 mcg/kg/min until doctors round in the AM. Will continue to monitor throughout shift.

## 2020-09-18 DEATH — deceased

## 2020-09-19 LAB — RENAL FUNCTION PANEL
Albumin: 2.1 g/dL — ABNORMAL LOW (ref 3.5–5.0)
Albumin: 2.1 g/dL — ABNORMAL LOW (ref 3.5–5.0)
Anion gap: 13 (ref 5–15)
Anion gap: 10 (ref 5–15)
BUN: 107 mg/dL — ABNORMAL HIGH (ref 8–23)
BUN: 115 mg/dL — ABNORMAL HIGH (ref 8–23)
CO2: 26 mmol/L (ref 22–32)
CO2: 27 mmol/L (ref 22–32)
Calcium: 8.4 mg/dL — ABNORMAL LOW (ref 8.9–10.3)
Calcium: 8.2 mg/dL — ABNORMAL LOW (ref 8.9–10.3)
Chloride: 114 mmol/L — ABNORMAL HIGH (ref 98–111)
Chloride: 118 mmol/L — ABNORMAL HIGH (ref 98–111)
Creatinine, Ser: 2.86 mg/dL — ABNORMAL HIGH (ref 0.61–1.24)
Creatinine, Ser: 2.74 mg/dL — ABNORMAL HIGH (ref 0.61–1.24)
GFR, Estimated: 23 mL/min — ABNORMAL LOW (ref >60)
GFR, Estimated: 24 mL/min — ABNORMAL LOW (ref >60)
Glucose, Bld: 175 mg/dL — ABNORMAL HIGH (ref 70–99)
Glucose, Bld: 166 mg/dL — ABNORMAL HIGH (ref 70–99)
Phosphorus: 6 mg/dL — ABNORMAL HIGH (ref 2.5–4.6)
Phosphorus: 5.8 mg/dL — ABNORMAL HIGH (ref 2.5–4.6)
Potassium: 4.2 mmol/L (ref 3.5–5.1)
Potassium: 3.9 mmol/L (ref 3.5–5.1)
Sodium: 153 mmol/L — ABNORMAL HIGH (ref 135–145)
Sodium: 155 mmol/L — ABNORMAL HIGH (ref 135–145)

## 2020-09-19 LAB — CBC
HCT: 31.2 % — ABNORMAL LOW (ref 39.0–52.0)
Hemoglobin: 10 g/dL — ABNORMAL LOW (ref 13.0–17.0)
MCH: 31.1 pg (ref 26.0–34.0)
MCHC: 32.1 g/dL (ref 30.0–36.0)
MCV: 96.9 fL (ref 80.0–100.0)
Platelets: 411 10*3/uL — ABNORMAL HIGH (ref 150–400)
RBC: 3.22 MIL/uL — ABNORMAL LOW (ref 4.22–5.81)
RDW: 14.6 % (ref 11.5–15.5)
WBC: 12.8 10*3/uL — ABNORMAL HIGH (ref 4.0–10.5)
nRBC: 0 % (ref 0.0–0.2)

## 2020-09-19 LAB — GLUCOSE, CAPILLARY
Glucose-Capillary: 141 mg/dL — ABNORMAL HIGH (ref 70–99)
Glucose-Capillary: 148 mg/dL — ABNORMAL HIGH (ref 70–99)
Glucose-Capillary: 138 mg/dL — ABNORMAL HIGH (ref 70–99)
Glucose-Capillary: 159 mg/dL — ABNORMAL HIGH (ref 70–99)
Glucose-Capillary: 152 mg/dL — ABNORMAL HIGH (ref 70–99)

## 2020-09-19 LAB — HEPATIC FUNCTION PANEL
ALT: 37 U/L (ref 0–44)
AST: 41 U/L (ref 15–41)
Albumin: 2.2 g/dL — ABNORMAL LOW (ref 3.5–5.0)
Alkaline Phosphatase: 109 U/L (ref 38–126)
Bilirubin, Direct: 0.1 mg/dL (ref 0.0–0.2)
Indirect Bilirubin: 0.6 mg/dL (ref 0.3–0.9)
Total Bilirubin: 0.7 mg/dL (ref 0.3–1.2)
Total Protein: 6 g/dL — ABNORMAL LOW (ref 6.5–8.1)

## 2020-09-19 LAB — TRIGLYCERIDES: Triglycerides: 166 mg/dL — ABNORMAL HIGH (ref <150)

## 2020-09-19 LAB — COOXEMETRY PANEL
Carboxyhemoglobin: 0.9 % (ref 0.5–1.5)
Carboxyhemoglobin: 0.9 % (ref 0.5–1.5)
Methemoglobin: 0.9 % (ref 0.0–1.5)
Methemoglobin: 1 % (ref 0.0–1.5)
O2 Saturation: 85.5 %
O2 Saturation: 78.3 %
Total hemoglobin: 10.3 g/dL — ABNORMAL LOW (ref 12.0–16.0)
Total hemoglobin: 11 g/dL — ABNORMAL LOW (ref 12.0–16.0)

## 2020-09-19 LAB — BRAIN NATRIURETIC PEPTIDE: B Natriuretic Peptide: 3486.3 pg/mL — ABNORMAL HIGH (ref 0.0–100.0)

## 2020-09-19 MED ORDER — PANTOPRAZOLE SODIUM 40 MG PO PACK
40.0000 mg | PACK | Freq: Every day | ORAL | Status: DC
  Administered 2020-09-19 – 2020-09-22 (×4): 40 mg
  Filled 2020-09-19 (×4): qty 20

## 2020-09-19 MED ORDER — FENTANYL BOLUS VIA INFUSION: 50.0000 ug | INTRAVENOUS | Status: DC | PRN

## 2020-09-19 MED ORDER — FENTANYL CITRATE (PF) 100 MCG/2ML IJ SOLN
50.0000 ug | INTRAMUSCULAR | Status: DC | PRN
  Administered 2020-09-22 (×2): 50 ug via INTRAVENOUS
  Filled 2020-09-19 (×2): qty 2

## 2020-09-19 MED ORDER — IVABRADINE HCL 5 MG PO TABS
2.5000 mg | ORAL_TABLET | Freq: Two times a day (BID) | ORAL | Status: DC
  Administered 2020-09-19 – 2020-09-20 (×2): 2.5 mg
  Filled 2020-09-19 (×2): qty 1

## 2020-09-19 MED ORDER — DEXMEDETOMIDINE HCL IN NACL 400 MCG/100ML IV SOLN
0.4000 ug/kg/h | INTRAVENOUS | Status: DC
  Administered 2020-09-19: 0.4 ug/kg/h via INTRAVENOUS
  Administered 2020-09-19: 0.5 ug/kg/h via INTRAVENOUS
  Administered 2020-09-20: 0.3 ug/kg/h via INTRAVENOUS
  Administered 2020-09-20 – 2020-09-21 (×2): 0.6 ug/kg/h via INTRAVENOUS
  Administered 2020-09-22: 0.2 ug/kg/h via INTRAVENOUS
  Filled 2020-09-19: qty 100
  Filled 2020-09-19: qty 200
  Filled 2020-09-19 (×3): qty 100

## 2020-09-19 MED ORDER — FENTANYL CITRATE (PF) 100 MCG/2ML IJ SOLN: 100.0000 ug | INTRAMUSCULAR | Status: DC | PRN

## 2020-09-19 MED ORDER — FREE WATER
200.0000 mL | Status: DC
  Administered 2020-09-19 – 2020-09-21 (×13): 200 mL

## 2020-09-19 NOTE — Progress Notes (Signed)
NAME:  Jesse Lowe, MRN:  [id number], DOB:  September 20, 1950, LOS: 13 ADMISSION DATE:  10-03-20, CONSULTATION DATE:  6/20 REFERRING MD:  Jacinto Halim, CHIEF COMPLAINT:  delirium and respiratory distress    History of Present Illness:  70 yr old male admitted with anterior wall STEMI .went to cath lab emergently. Found to have lesion in LAD and high grade stenosis of the RCA and Circ. Underwent successful stent of LAD. And IAB placement. He was weaned off pressors. IABP dc'd am 6/20. Progressively more confused over course of day (had received Ambien that evening) 6/20 w/ increased WOB and agitation. PCCM consulted that afternoon for agitated delirium.   Pertinent  Medical History  HTN and HL Had not seen MD in 10-15 yrs.  Significant Hospital Events: Including procedures, antibiotic start and stop dates in addition to other pertinent events   6/19 admitted w/ STEMI. While waiting had VF arrest req'd ACLS w/ defib, amio epi; estimated 5 minutes to ROSC. Was awake and alert after ROSC obtained. Went to cath lab emergently. Found to have lesion in LAD and high grade stenosis of the RCA and Circ. Underwent successful stent of LAD. And IAB placement 6/20 off IABP. Pressors off. Progressive agitation and WOB. Intubated PCCM asked to consult 6/24 reintubated for shock, milrinone restarted Echo 09/07/2020 > Left ventricular ejection fraction, by estimation, is 20 to 25%. The left ventricle has severely decreased function. Left ventricular diastolic parameters were normal. There is akinesis of the left ventricular, entire anterior wall, anterolateral wall and apical Segment, mildly elevated pulmonary artery systolic pressure. The estimated right ventricular systolic pressure is 33.9 mmHg. Left atrial size was mildly dilated. 6/28 weaning sedation. Bronched with copious secretions, some hemoptysis seemed like from suction trauma 6/29 incr sedation. Having high RR with desats. Worse rhonchi, new wheeze. Adding  aggressive lasix. Adding versed gtt and gaba  6/30 versed changed to prop. Back on NE 7/1 weaning sedation and NE   Interim History / Subjective:  Weaning sedation today. Off NE. Minimal secretions   Objective   Blood pressure 112/67, pulse 85, temperature (!) 101.48 F (38.6 C), temperature source Bladder, resp. rate (!) 30, height 5\' 9"  (1.753 m), weight 82.7 kg, SpO2 95 %. CVP:  [12 mmHg-17 mmHg] 17 mmHg  Vent Mode: PSV;CPAP FiO2 (%):  [40 %] 40 % Set Rate:  [22 bmp] 22 bmp Vt Set:  [490 mL] 490 mL PEEP:  [8 cmH20] 8 cmH20 Pressure Support:  [10 cmH20] 10 cmH20 Plateau Pressure:  [19 cmH20-22 cmH20] 21 cmH20   Intake/Output Summary (Last 24 hours) at 09/19/2020 1231 Last data filed at 09/19/2020 1100 Gross per 24 hour  Intake 1357.19 ml  Output 2465 ml  Net -1107.81 ml    Filed Weights   09/17/20 0500 09/18/20 0600 09/19/20 0418  Weight: 82.1 kg 80.8 kg 82.7 kg    Examination: General: Critically and chronically ill appearing M, appears  stated age, intubated sedated NAD  Neuro: will track to voice and follow commands intermittently.  HEENT: NGT and ETT in place.  Cardiovascular: JVP 3cm ASA. HS normal, no edemal Lungs: chest clear. Tolerating PSV  Abdomen: soft round ndnt + bowel sounds  Musculoskeletal:  no acute deformity no cyanosis or clubbing  Skin: pale c/d/w   Assessment & Plan:   Acute metabolic encephalopathy  due critical illness and 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  Acute hypoxic respiratory failure due to H Flu PNA and Pulm edema - improving  VF arrest due to STEMI  STEMI,  s/p LAD stent Acute systolic HFrEF Cardiogenic shock + vasodilatory shock related to medications  Hx CAD involving RCA LCX  AKI - Constipation  Hypernatremia due to decreased free water access.   Plan:  - stop milrinone today and follow ScvO2 - Appears close to euvolemic - no diuretic - Start free water. - Reglan stopped.  - SBT today.  Extubation likely to be  dictated by mental status and muscle strength - dexmedetomidine if needed for overnight sedation.   Best Practice (right click and "Reselect all SmartList Selections" daily)   Diet/type: EN  Pain/Anxiety/Delirium protocol Yes and RASS goal: 0 to -1 VAP protocol (if indicated): Yes DVT prophylaxis: LMWH GI prophylaxis: PPI Glucose control:  SSI Central venous access:  Yes, and it is still needed Arterial line:  N/A Foley:  N/A and Yes, and it is still needed Mobility:  bed rest  PT consulted: N/A Code Status:  full code Last date of multidisciplinary goals of care discussion has 2 brothers in [place] and [place]. A coworker is working on Health and safety inspector for family in [place] (Coworker Steward Drone can be reached on [phone]) In ccm conversation w Dr. Jacinto Halim 6/29, dr Jacinto Halim indicated pt previously has expressed being estranged from family friends and not having a person he would have/want to make medical decisions  Disposition: remains critically ill, will stay in intensive care   CRITICAL CARE Performed by: Lynnell Catalan   Total critical care time: 40 minutes  Critical care time was exclusive of separately billable procedures and treating other patients. Critical care was necessary to treat or prevent imminent or life-threatening deterioration.  Critical care was time spent personally by me on the following activities: development of treatment plan with patient and/or surrogate as well as nursing, discussions with consultants, evaluation of patient's response to treatment, examination of patient, obtaining history from patient or surrogate, ordering and performing treatments and interventions, ordering and review of laboratory studies, ordering and review of radiographic studies, pulse oximetry and re-evaluation of patient's condition.  Lynnell Catalan, MD [facility] ICU Physician [facility] [place] Critical Care  Pager: [phone] Or Epic Secure Chat After hours: [phone].  09/19/2020,  12:44 PM     09/19/2020, 12:31 PM

## 2020-09-19 NOTE — Plan of Care (Signed)
  Problem: Clinical Measurements: Goal: Ability to maintain clinical measurements within normal limits will improve Outcome: Progressing Goal: Cardiovascular complication will be avoided Outcome: Progressing   Problem: Nutrition: Goal: Adequate nutrition will be maintained Outcome: Progressing   

## 2020-09-19 NOTE — Progress Notes (Signed)
eLink Physician-Brief Progress Note Patient Name: Jesse Lowe DOB: [birth year] MRN: [id number]   Date of Service  09/19/2020  HPI/Events of Note  Frequent loose, watery stools. Nursing request for Flexiseal.   eICU Interventions  Plan: Place Flexiseal     Intervention Category Major Interventions: Other:  [name] 09/19/2020, 5:13 AM

## 2020-09-19 NOTE — Progress Notes (Signed)
Subjective:   Patient remains intubated, frequent watery stools yesterday  Intake/Output from previous day:  I/O last 3 completed shifts: In: 2673.8 [I.V.:1253.8; NG/GT:1320; IV Piggyback:100] Out: 0388 [Urine:2710; Emesis/NG output:240; Other:100; Stool:100] Total I/O In: 679.9 [I.V.:19.8; NG/GT:560; IV Piggyback:100.1] Out: 700 [Urine:500; Stool:200]  Blood pressure 112/67, pulse 85, temperature (!) 101.48 F (38.6 C), temperature source Bladder, resp. rate (!) 30, height 5' 9"  (1.753 m), weight 82.7 kg, SpO2 95 %.  Vitals with BMI 09/19/2020 09/19/2020 09/19/2020  Height - - -  Weight - - -  BMI - - -  Systolic 828 003 491  Diastolic 67 62 61  Pulse 85 81 80     Physical Exam Vitals reviewed.  Constitutional:      General: He is not in acute distress.    Appearance: He is ill-appearing. He is not diaphoretic.     Interventions: He is sedated and intubated.  HENT:     Head: Normocephalic and atraumatic.  Neck:     Vascular: No carotid bruit.     Comments: Could not evaluate JVD. Cardiovascular:     Rate and Rhythm: Regular rhythm. Tachycardia present.     Pulses: Normal pulses and intact distal pulses.     Heart sounds: S1 normal and S2 normal. No murmur heard.   No gallop.  Pulmonary:     Effort: No respiratory distress. He is intubated.     Breath sounds: Normal breath sounds and air entry. No wheezing, rhonchi or rales (Bilateral diffuse and extensive).  Abdominal:     General: Bowel sounds are normal. There is no distension.  Musculoskeletal:     Right lower leg: No edema.     Left lower leg: No edema.  Skin:    General: Skin is warm.     Capillary Refill: Capillary refill takes less than 2 seconds.  5.  Lab Results:  BNP (last 3 results) Recent Labs    09/13/20 0203 09/18/20 1125 09/19/20 0408  BNP 1,687.7* 2,719.0* 3,486.3*   ProBNP (last 3 results) No results for input(s): PROBNP in the last 8760 hours. BMP Latest Ref Rng & Units 09/19/2020 09/18/2020  09/18/2020  Glucose 70 - 99 mg/dL 175(H) 184(H) 193(H)  BUN 8 - 23 mg/dL 107(H) 93(H) 77(H)  Creatinine 0.61 - 1.24 mg/dL 2.86(H) 2.80(H) 2.43(H)  Sodium 135 - 145 mmol/L 153(H) 149(H) 148(H)  Potassium 3.5 - 5.1 mmol/L 4.2 4.4 4.4  Chloride 98 - 111 mmol/L 114(H) 110 112(H)  CO2 22 - 32 mmol/L 26 25 23   Calcium 8.9 - 10.3 mg/dL 8.4(L) 8.3(L) 8.1(L)   Hepatic Function Latest Ref Rng & Units 09/19/2020 09/19/2020 09/18/2020  Total Protein 6.5 - 8.1 g/dL - 6.0(L) -  Albumin 3.5 - 5.0 g/dL 2.1(L) 2.2(L) 2.1(L)  AST 15 - 41 U/L - 41 -  ALT 0 - 44 U/L - 37 -  Alk Phosphatase 38 - 126 U/L - 109 -  Total Bilirubin 0.3 - 1.2 mg/dL - 0.7 -  Bilirubin, Direct 0.0 - 0.2 mg/dL - 0.1 -   CBC Latest Ref Rng & Units 09/19/2020 09/18/2020 09/17/2020  WBC 4.0 - 10.5 K/uL 12.8(H) 13.9(H) 12.1(H)  Hemoglobin 13.0 - 17.0 g/dL 10.0(L) 10.5(L) 10.5(L)  Hematocrit 39.0 - 52.0 % 31.2(L) 33.4(L) 33.9(L)  Platelets 150 - 400 K/uL 411(H) 501(H) 405(H)   Lipid Panel     Component Value Date/Time   CHOL 182 08/22/2020 1551   TRIG 166 (H) 09/19/2020 0408   HDL 31 (L) 09/13/2020 1551  CHOLHDL 5.9 08/30/2020 1551   VLDL 45 (H) 08/25/2020 1551   LDLCALC 106 (H) 09/11/2020 1551    HEMOGLOBIN A1C Lab Results  Component Value Date   HGBA1C 5.3 09/07/2020   MPG 105 09/07/2020   TSH Recent Labs    09/01/2020 1859  TSH 2.235   BNP (last 3 results) Recent Labs    09/13/20 0203 09/18/20 1125 09/19/20 0408  BNP 1,687.7* 2,719.0* 3,486.3*  Imaging:  PORTABLE CHEST 1 VIEW 09/18/2020:   COMPARISON:  09/17/2020 and prior studies FINDINGS: An endotracheal tube with tip 4 cm above the carina, RIGHT PICC line with tip overlying the SUPERIOR cavoatrial junction and enteric tube entering the stomach with tip off the field of view again noted.   Mild pulmonary vascular congestion and mild bibasilar atelectasis again noted.   There is no evidence of pneumothorax.Portable chest x-ray 09/09/2020:  Central lines and  tubes in stable position.  Cardiomegaly again noted.  Bilateral pulmonary infiltrates/edema again noted.  Findings suggestive of CHF with bilateral pleural effusion.  DG Chest X-ray 08/20/2020:  Cardiac shadow is within normal limits. Prior coronary stenting is seen. Intra-aortic balloon pump is noted just below the aortic knob. Lungs are well aerated bilaterally with mild interstitial edema.  IMPRESSION: Mild interstitial edema. Balloon pump in satisfactory position.  Cardiac Studies: Left heart catheterization 08/21/2020: LV 56/15, EDP 21 mmHg.  Aorta 75/44, mean 57 mmHg.  No pressure gradient across the aortic valve.  Markedly elevated EDP. LM: Large vessel, bifurcates into circumflex and LAD. LAD: Has ulcerated subtotally occluded proximal LAD stenosis that extends to the mid segment.  There is TIMI I flow also in the D1 and D2.  Moderate-sized D1 and small sized D2.  Mild disease in the mid to distal LAD. CX: Moderate sized vessel, gives origin to a large OM1, OM 2 is very small, there is a lesion after OM 2 at 80% focal lesion.  OM 3 is moderate sized with secondary branches. RCA: Dominant.  Proximal segment 90% stenosis, tandem 30 to 40% stenosis in the midsegment followed by a high-grade 90% stenosis.  Mild disease in the distal right coronary.   Intervention: Successful 2 overlapping stent implantation with 3.0 x 24 mm followed by 3.0 x 16 mm Synergy XD stents, stenosis reduced from 99% to 0%, TIMI I improved to TIMI-3 flow.   Recommendation: Patient will need relook at the LAD stent in view of severe spasm and cardiogenic shock and need for pressor support in the cardiac catheterization lab.  Patient was started on Levophed due to low blood pressure and intra-aortic balloon pump was inserted percutaneously prior to angioplasty.  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  115  mL contrast utilized.  Echocardiogram 09/16/2020:  1. Left ventricular ejection fraction, by estimation, is 20 to 25%. Left ventricular ejection fraction by PLAX is 35 %. The left ventricle has severely decreased function. The left ventricle demonstrates regional wall motion abnormalities (see scoring diagram/findings for description). Left ventricular diastolic function could not be evaluated. There is akinesis of the left ventricular, entire anterolateral wall, anterior wall and apical segment. There is akinesis of the left ventricular, entire apical segment.  2. Right ventricular systolic function was not well visualized. The right ventricular size is normal.  3. The mitral valve is normal in structure. No evidence of mitral valve regurgitation. No evidence of mitral stenosis.  4.  The aortic valve is normal in structure. Aortic valve regurgitation is not visualized. No aortic stenosis is present.  5. There is mild dilatation of the aortic root, measuring 39 mm.  6. The inferior vena cava is dilated in size with <50% respiratory variability, suggesting right atrial pressure of 15 mmHg.   Comparison(s): Compared to 09/11/2020, LVEF is improved marginally from 10 to 15% to the present 25% to 30%.  EKG:   EKG 09/10/2020: Normal sinus rhythm at rate of 83 bpm, normal axis, poor R wave progression, cannot exclude anteroseptal infarct old.  Nonspecific ST-T abnormality.  Compared to 09/15/2020, minimal anterolateral ST elevation not present.  08/21/2020: 1557 hrs.: Ventricular fibrillation. 08/25/2020 at 1550 hrs.: Anteroseptal and high lateral STEMI.   Telemetry: Sinus rhythm with runs of nonsustained ventricular tachycardia 6-8 beats  Scheduled Meds:  aspirin  81 mg Per Tube Daily   atorvastatin  80 mg Per Tube Daily   chlorhexidine gluconate (MEDLINE KIT)  15 mL Mouth Rinse BID   Chlorhexidine Gluconate Cloth  6 each Topical Q2000   docusate  100 mg Per Tube BID   enoxaparin (LOVENOX)  injection  30 mg Subcutaneous Q24H   feeding supplement (PROSource TF)  45 mL Per Tube BID   fentaNYL (SUBLIMAZE) injection  25 mcg Intravenous Once   folic acid  1 mg Per Tube Daily   free water  200 mL Per Tube Q4H   gabapentin  100 mg Per Tube Q8H   insulin aspart  0-15 Units Subcutaneous Q4H   ivabradine  2.5 mg Per Tube BID WC   mouth rinse  15 mL Mouth Rinse 10 times per day   multivitamin with minerals  1 tablet Per Tube Daily   pantoprazole sodium  40 mg Per Tube Daily   polyethylene glycol  17 g Per Tube Daily   QUEtiapine  25 mg Per Tube BID   sodium chloride flush  10-40 mL Intracatheter Q12H   sodium chloride flush  3 mL Intravenous Q12H   thiamine injection  100 mg Intravenous Daily   ticagrelor  90 mg Per Tube BID   Continuous Infusions:  sodium chloride 10 mL/hr at 09/19/20 1200   cefTRIAXone (ROCEPHIN)  IV Stopped (09/19/20 1115)   dexmedetomidine (PRECEDEX) IV infusion     feeding supplement (VITAL 1.5 CAL) 1,000 mL (09/19/20 1230)   PRN Meds:.acetaminophen (TYLENOL) oral liquid 160 mg/5 mL, albuterol, bisacodyl, fentaNYL (SUBLIMAZE) injection, midazolam, nitroGLYCERIN, ondansetron (ZOFRAN) IV, sodium chloride flush, sodium chloride flush  Assessment/Plan:   1.  Cardiogenic shock.  2.  Heart failure with reduced ejection fraction,  Ischemic cardiomyopathy 3.  Acute renal failure secondary to ATN from cardiogenic shock.  4. Altered mental status,  out of proportion to anoxic brain injury now emparically being treated for EtOH withdrawal.  Recommendation:   Patient is still volume overloaded and requiring ventilatory support, off of inotrope this morning. Levo off yesterday resultant elevation in BNP and reduced UOP.  In spite of being off of sedation, patient remains confused and not following commands.  Will discuss with PPCM regarding goals of therapy and I did get in touch with patient's estranged brother Calan Doren. Witnessed by Dione Plover, RN. Discussed all  the medical facts and asked his opinion regarding withdrawal of care.  He completely understands the implications, states that he just had to do a similar situation and had to make decisions for his wife sister who passed away this morning.  Plan of care is to watch him  today, make him DNR today, and terminally extubate him tomorrow, however if situation changes tonight he can be terminally extubated at anytime, if he does not make any progress.  However if clinical situation were to change, we will continue aggressive measures.  His brother does not wish to do any endotracheal intubation or keep him on life support indefinitely.  I spent a total of 45 minutes with review of his chart, medical issues, medications and discussions with the family.  Dr. With CCCM Dr. Doyne Keel, who is also in agreement.   45 minutes of critical care time   Adrian Prows, MD, [facility] 09/19/2020, 2:08 PM Office: [phone] Fax: [phone] Pager: [phone]

## 2020-09-20 LAB — CBC
HCT: 33.1 % — ABNORMAL LOW (ref 39.0–52.0)
Hemoglobin: 9.9 g/dL — ABNORMAL LOW (ref 13.0–17.0)
MCH: 29.5 pg (ref 26.0–34.0)
MCHC: 29.9 g/dL — ABNORMAL LOW (ref 30.0–36.0)
MCV: 98.5 fL (ref 80.0–100.0)
Platelets: 423 10*3/uL — ABNORMAL HIGH (ref 150–400)
RBC: 3.36 MIL/uL — ABNORMAL LOW (ref 4.22–5.81)
RDW: 14.7 % (ref 11.5–15.5)
WBC: 11.5 10*3/uL — ABNORMAL HIGH (ref 4.0–10.5)
nRBC: 0 % (ref 0.0–0.2)

## 2020-09-20 LAB — GLUCOSE, CAPILLARY
Glucose-Capillary: 133 mg/dL — ABNORMAL HIGH (ref 70–99)
Glucose-Capillary: 134 mg/dL — ABNORMAL HIGH (ref 70–99)
Glucose-Capillary: 139 mg/dL — ABNORMAL HIGH (ref 70–99)
Glucose-Capillary: 144 mg/dL — ABNORMAL HIGH (ref 70–99)
Glucose-Capillary: 145 mg/dL — ABNORMAL HIGH (ref 70–99)
Glucose-Capillary: 153 mg/dL — ABNORMAL HIGH (ref 70–99)
Glucose-Capillary: 153 mg/dL — ABNORMAL HIGH (ref 70–99)

## 2020-09-20 LAB — RENAL FUNCTION PANEL
Albumin: 2.1 g/dL — ABNORMAL LOW (ref 3.5–5.0)
Anion gap: 14 (ref 5–15)
BUN: 137 mg/dL — ABNORMAL HIGH (ref 8–23)
CO2: 26 mmol/L (ref 22–32)
Calcium: 8.5 mg/dL — ABNORMAL LOW (ref 8.9–10.3)
Chloride: 112 mmol/L — ABNORMAL HIGH (ref 98–111)
Creatinine, Ser: 3 mg/dL — ABNORMAL HIGH (ref 0.61–1.24)
GFR, Estimated: 22 mL/min — ABNORMAL LOW (ref >60)
Glucose, Bld: 160 mg/dL — ABNORMAL HIGH (ref 70–99)
Phosphorus: 7.4 mg/dL — ABNORMAL HIGH (ref 2.5–4.6)
Potassium: 4.1 mmol/L (ref 3.5–5.1)
Sodium: 152 mmol/L — ABNORMAL HIGH (ref 135–145)

## 2020-09-20 LAB — BRAIN NATRIURETIC PEPTIDE: B Natriuretic Peptide: >4500 pg/mL — ABNORMAL HIGH (ref 0.0–100.0)

## 2020-09-20 LAB — COOXEMETRY PANEL
Carboxyhemoglobin: 0.8 % (ref 0.5–1.5)
Methemoglobin: 0.7 % (ref 0.0–1.5)
O2 Saturation: 72.8 %
Total hemoglobin: 10.5 g/dL — ABNORMAL LOW (ref 12.0–16.0)

## 2020-09-20 MED ORDER — ALBUTEROL SULFATE (2.5 MG/3ML) 0.083% IN NEBU: 2.5000 mg | INHALATION_SOLUTION | RESPIRATORY_TRACT | Status: DC | PRN

## 2020-09-20 MED ORDER — GERHARDT'S BUTT CREAM
TOPICAL_CREAM | Freq: Three times a day (TID) | CUTANEOUS | Status: DC | PRN
  Administered 2020-09-20: 1 via TOPICAL
  Filled 2020-09-20: qty 1

## 2020-09-20 MED ORDER — ALBUTEROL SULFATE (2.5 MG/3ML) 0.083% IN NEBU
INHALATION_SOLUTION | RESPIRATORY_TRACT | Status: AC
  Administered 2020-09-20: 2.5 mg
  Filled 2020-09-20: qty 3

## 2020-09-20 MED ORDER — FUROSEMIDE 10 MG/ML IJ SOLN
4.0000 mg/h | INTRAVENOUS | Status: DC
  Administered 2020-09-20: 8 mg/h via INTRAVENOUS
  Filled 2020-09-20 (×2): qty 20

## 2020-09-20 MED ORDER — FUROSEMIDE 10 MG/ML IJ SOLN
80.0000 mg | Freq: Once | INTRAMUSCULAR | Status: AC
  Administered 2020-09-20: 80 mg via INTRAVENOUS
  Filled 2020-09-20: qty 8

## 2020-09-20 MED ORDER — MORPHINE SULFATE (PF) 2 MG/ML IV SOLN
2.0000 mg | INTRAVENOUS | Status: AC
  Administered 2020-09-20: 2 mg via INTRAVENOUS
  Filled 2020-09-20: qty 1

## 2020-09-20 NOTE — Progress Notes (Signed)
Subjective:   Patient remains intubated, was in mild respiratory distress this morning hence had to be very sedated.  Appears to follow some commands as per nursing notes.  Intake/Output from previous day:  I/O last 3 completed shifts: In: 3826.1 [I.V.:501; Other:45; NG/GT:3180; IV Piggyback:100.1] Out: 2805 [Urine:2105; Stool:700] Total I/O In: 623.1 [I.V.:53.1; NG/GT:470; IV Piggyback:100] Out: 1200 [Urine:600; Stool:600]  Blood pressure 119/73, pulse 87, temperature 99.5 F (37.5 C), resp. rate (!) 22, height 5' 9" (1.753 m), weight 79.7 kg, SpO2 94 %.  Vitals with BMI 09/20/2020 09/20/2020 09/20/2020  Height - - -  Weight - - -  BMI - - -  Systolic 009 381 87  Diastolic 73 65 56  Pulse 87 75 59     Physical Exam Vitals reviewed.  Constitutional:      General: He is in acute distress (Mild tachypnea and respiratory distress).     Appearance: He is ill-appearing. He is not diaphoretic.     Interventions: He is sedated and intubated.  HENT:     Head: Normocephalic and atraumatic.  Neck:     Vascular: No carotid bruit.     Comments: Could not evaluate JVD. Cardiovascular:     Rate and Rhythm: Regular rhythm. Tachycardia present.     Pulses: Normal pulses and intact distal pulses.     Heart sounds: S1 normal and S2 normal. No murmur heard.   No gallop.  Pulmonary:     Effort: No respiratory distress. He is intubated.     Breath sounds: Normal air entry. Rales (Right basal crackles, faint left basal crackles) present. No wheezing or rhonchi.  Abdominal:     General: Bowel sounds are normal. There is no distension.  Musculoskeletal:     Right lower leg: No edema.     Left lower leg: No edema.  Skin:    General: Skin is warm.     Capillary Refill: Capillary refill takes less than 2 seconds.  5.  Lab Results:  BNP (last 3 results) Recent Labs    09/18/20 1125 09/19/20 0408 09/20/20 0322  BNP 2,719.0* 3,486.3* >4,500.0*    ProBNP (last 3 results) No results for  input(s): PROBNP in the last 8760 hours. BMP Latest Ref Rng & Units 09/20/2020 09/19/2020 09/19/2020  Glucose 70 - 99 mg/dL 160(H) 166(H) 175(H)  BUN 8 - 23 mg/dL 137(H) 115(H) 107(H)  Creatinine 0.61 - 1.24 mg/dL 3.00(H) 2.74(H) 2.86(H)  Sodium 135 - 145 mmol/L 152(H) 155(H) 153(H)  Potassium 3.5 - 5.1 mmol/L 4.1 3.9 4.2  Chloride 98 - 111 mmol/L 112(H) 118(H) 114(H)  CO2 22 - 32 mmol/L _0 Calcium 8.9 - 10.3 mg/dL 8.5(L) 8.2(L) 8.4(L)   Hepatic Function Latest Ref Rng & Units 09/20/2020 09/19/2020 09/19/2020  Total Protein 6.5 - 8.1 g/dL - - -  Albumin 3.5 - 5.0 g/dL 2.1(L) 2.1(L) 2.1(L)  AST 15 - 41 U/L - - -  ALT 0 - 44 U/L - - -  Alk Phosphatase 38 - 126 U/L - - -  Total Bilirubin 0.3 - 1.2 mg/dL - - -  Bilirubin, Direct 0.0 - 0.2 mg/dL - - -   CBC Latest Ref Rng & Units 09/20/2020 09/19/2020 09/18/2020  WBC 4.0 - 10.5 K/uL 11.5(H) 12.8(H) 13.9(H)  Hemoglobin 13.0 - 17.0 g/dL 9.9(L) 10.0(L) 10.5(L)  Hematocrit 39.0 - 52.0 % 33.1(L) 31.2(L) 33.4(L)  Platelets 150 - 400 K/uL 423(H) 411(H) 501(H)   Lipid Panel     Component Value Date/Time  CHOL 182 08/31/2020 1551   TRIG 166 (H) 09/19/2020 0408   HDL 31 (L) 08/23/2020 1551   CHOLHDL 5.9 09/07/2020 1551   VLDL 45 (H) 09/05/2020 1551   LDLCALC 106 (H) 09/02/2020 1551    HEMOGLOBIN A1C Lab Results  Component Value Date   HGBA1C 5.3 09/07/2020   MPG 105 09/07/2020   TSH Recent Labs    09/05/2020 1859  TSH 2.235    BNP (last 3 results) Recent Labs    09/18/20 1125 09/19/20 0408 09/20/20 0322  BNP 2,719.0* 3,486.3* >4,500.0*   Imaging:  PORTABLE CHEST 1 VIEW 09/18/2020:   COMPARISON:  09/17/2020 and prior studies FINDINGS: An endotracheal tube with tip 4 cm above the carina, RIGHT PICC line with tip overlying the SUPERIOR cavoatrial junction and enteric tube entering the stomach with tip off the field of view again noted.   Mild pulmonary vascular congestion and mild bibasilar atelectasis again noted.   There is no  evidence of pneumothorax.Portable chest x-ray 09/09/2020:  Central lines and tubes in stable position.  Cardiomegaly again noted.  Bilateral pulmonary infiltrates/edema again noted.  Findings suggestive of CHF with bilateral pleural effusion.  DG Chest X-ray 09/10/2020:  Cardiac shadow is within normal limits. Prior coronary stenting is seen. Intra-aortic balloon pump is noted just below the aortic knob. Lungs are well aerated bilaterally with mild interstitial edema.  IMPRESSION: Mild interstitial edema. Balloon pump in satisfactory position.  Cardiac Studies: Left heart catheterization 08/30/2020: LV 56/15, EDP 21 mmHg.  Aorta 75/44, mean 57 mmHg.  No pressure gradient across the aortic valve.  Markedly elevated EDP. LM: Large vessel, bifurcates into circumflex and LAD. LAD: Has ulcerated subtotally occluded proximal LAD stenosis that extends to the mid segment.  There is TIMI I flow also in the D1 and D2.  Moderate-sized D1 and small sized D2.  Mild disease in the mid to distal LAD. CX: Moderate sized vessel, gives origin to a large OM1, OM 2 is very small, there is a lesion after OM 2 at 80% focal lesion.  OM 3 is moderate sized with secondary branches. RCA: Dominant.  Proximal segment 90% stenosis, tandem 30 to 40% stenosis in the midsegment followed by a high-grade 90% stenosis.  Mild disease in the distal right coronary.   Intervention: Successful 2 overlapping stent implantation with 3.0 x 24 mm followed by 3.0 x 16 mm Synergy XD stents, stenosis reduced from 99% to 0%, TIMI I improved to TIMI-3 flow.   Recommendation: Patient will need relook at the LAD stent in view of severe spasm and cardiogenic shock and need for pressor support in the cardiac catheterization lab.  Patient was started on Levophed due to low blood pressure and intra-aortic balloon pump was inserted percutaneously prior to angioplasty.  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  115 mL contrast utilized.  Echocardiogram 09/16/2020:  1. Left ventricular ejection fraction, by estimation, is 20 to 25%. Left ventricular ejection fraction by PLAX is 35 %. The left ventricle has severely decreased function. The left ventricle demonstrates regional wall motion abnormalities (see scoring diagram/findings for description). Left ventricular diastolic function could not be evaluated. There is akinesis of the left ventricular, entire anterolateral wall, anterior wall and apical segment. There is akinesis of the left ventricular, entire apical segment.  2. Right ventricular systolic function was not well visualized. The right ventricular size is normal.  3. The mitral valve is normal in structure. No evidence of mitral valve regurgitation. No evidence of mitral stenosis.  4. The aortic valve is normal in structure. Aortic valve regurgitation is not visualized. No aortic stenosis is present.  5. There is mild dilatation of the aortic root, measuring 39 mm.  6. The inferior vena cava is dilated in size with <50% respiratory variability, suggesting right atrial pressure of 15 mmHg.   Comparison(s): Compared to 09/11/2020, LVEF is improved marginally from 10 to 15% to the present 25% to 30%.  EKG:   EKG 09/10/2020: Normal sinus rhythm at rate of 83 bpm, normal axis, poor R wave progression, cannot exclude anteroseptal infarct old.  Nonspecific ST-T abnormality.  Compared to 09/15/2020, minimal anterolateral ST elevation not present.  08/24/2020: 1557 hrs.: Ventricular fibrillation. 09/01/2020 at 1550 hrs.: Anteroseptal and high lateral STEMI.   Telemetry: Sinus rhythm with runs of nonsustained ventricular tachycardia 6-8 beats  Scheduled Meds:  aspirin  81 mg Per Tube Daily   atorvastatin  80 mg Per Tube Daily   chlorhexidine gluconate (MEDLINE KIT)  15 mL Mouth Rinse BID   Chlorhexidine Gluconate Cloth  6  each Topical Q2000   docusate  100 mg Per Tube BID   enoxaparin (LOVENOX) injection  30 mg Subcutaneous Q24H   feeding supplement (PROSource TF)  45 mL Per Tube BID   fentaNYL (SUBLIMAZE) injection  25 mcg Intravenous Once   folic acid  1 mg Per Tube Daily   free water  200 mL Per Tube Q4H   gabapentin  100 mg Per Tube Q8H   insulin aspart  0-15 Units Subcutaneous Q4H   mouth rinse  15 mL Mouth Rinse 10 times per day   multivitamin with minerals  1 tablet Per Tube Daily   pantoprazole sodium  40 mg Per Tube Daily   polyethylene glycol  17 g Per Tube Daily   QUEtiapine  25 mg Per Tube BID   sodium chloride flush  10-40 mL Intracatheter Q12H   sodium chloride flush  3 mL Intravenous Q12H   thiamine injection  100 mg Intravenous Daily   ticagrelor  90 mg Per Tube BID   Continuous Infusions:  sodium chloride 10 mL/hr at 09/20/20 1100   cefTRIAXone (ROCEPHIN)  IV Stopped (09/20/20 1043)   dexmedetomidine (PRECEDEX) IV infusion 0.5 mcg/kg/hr (09/20/20 1100)   feeding supplement (VITAL 1.5 CAL) 1,000 mL (09/20/20 0400)   furosemide (LASIX) 200 mg in dextrose 5% 100 mL (31m/mL) infusion 8 mg/hr (09/20/20 1112)   PRN Meds:.acetaminophen (TYLENOL) oral liquid 160 mg/5 mL, albuterol, bisacodyl, fentaNYL (SUBLIMAZE) injection, midazolam, nitroGLYCERIN, ondansetron (ZOFRAN) IV, sodium chloride flush, sodium chloride flush  Assessment/Plan:   1.  Cardiogenic shock.  2.  Heart failure with reduced ejection fraction,  Ischemic cardiomyopathy 3.  Acute renal failure secondary to ATN from cardiogenic shock.  4. 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   Recommendation:   Patient is still volume overloaded and requiring ventilatory support, off of inotrope.  Discussed with pulmonary critical care, at this point as he was mildly responding to oral commands, we will try to diurese him aggressively to see whether we can extubate him when he is more euvolemic so as to give him  a chance of survival.  He is presently DNR, we will not escalate his therapy.  Patient's family is also on board regarding terminal wean if situation were to change.  No plans on tracheostomy, continue conservative therapy for  now.  Discussed with pulmonary critical care, agree with the plan on IV diuresis for now to mobilize fluid as much as possible prior to extubation.  Unable to use any guideline directed medical therapy in view of continued cardiogenic shock and respiratory distress and pulmonary edema.  Mental status still remains precarious.  45 minutes of critical care time   Adrian Prows, MD, [facility] 09/20/2020, 11:24 AM Office: [phone] Fax: [phone] Pager: [phone]

## 2020-09-20 NOTE — Progress Notes (Signed)
NAME:  Jesse Lowe, MRN:  [id number], DOB:  06/03/1950, LOS: 14 ADMISSION DATE:  09/13/2020, CONSULTATION DATE:  6/20 REFERRING MD:  Jacinto Halim, CHIEF COMPLAINT:  delirium and respiratory distress    History of Present Illness:  70 yr old male admitted with anterior wall STEMI .went to cath lab emergently. Found to have lesion in LAD and high grade stenosis of the RCA and Circ. Underwent successful stent of LAD. And IAB placement. He was weaned off pressors. IABP dc'd am 6/20. Progressively more confused over course of day (had received Ambien that evening) 6/20 w/ increased WOB and agitation. PCCM consulted that afternoon for agitated delirium.   Pertinent  Medical History  HTN and HL Had not seen MD in 10-15 yrs.  Significant Hospital Events: Including procedures, antibiotic start and stop dates in addition to other pertinent events   6/19 admitted w/ STEMI. While waiting had VF arrest req'd ACLS w/ defib, amio epi; estimated 5 minutes to ROSC. Was awake and alert after ROSC obtained. Went to cath lab emergently. Found to have lesion in LAD and high grade stenosis of the RCA and Circ. Underwent successful stent of LAD. And IAB placement 6/20 off IABP. Pressors off. Progressive agitation and WOB. Intubated PCCM asked to consult 6/24 reintubated for shock, milrinone restarted Echo 09/07/2020 > Left ventricular ejection fraction, by estimation, is 20 to 25%. The left ventricle has severely decreased function. Left ventricular diastolic parameters were normal. There is akinesis of the left ventricular, entire anterior wall, anterolateral wall and apical Segment, mildly elevated pulmonary artery systolic pressure. The estimated right ventricular systolic pressure is 33.9 mmHg. Left atrial size was mildly dilated. 6/28 weaning sedation. Bronched with copious secretions, some hemoptysis seemed like from suction trauma 6/29 incr sedation. Having high RR with desats. Worse rhonchi, new wheeze. Adding  aggressive lasix. Adding versed gtt and gaba  6/30 versed changed to prop. Back on NE 7/1 weaning sedation and NE  7/2 milrinone stopped 7/3 SBT interrupted due to respiratory distress. Started on Furosemide infusion.   Interim History / Subjective:  Remains off milrinone. Following commands on Precedex. Severe respiratory distress on SBT.  Objective   Blood pressure 119/73, pulse 87, temperature 99.5 F (37.5 C), resp. rate (!) 26, height 5\' 9"  (1.753 m), weight 79.7 kg, SpO2 91 %. CVP:  [6 mmHg-18 mmHg] 12 mmHg  Vent Mode: PRVC FiO2 (%):  [40 %] 40 % Set Rate:  [22 bmp] 22 bmp Vt Set:  [490 mL] 490 mL PEEP:  [8 cmH20] 8 cmH20 Pressure Support:  [10 cmH20] 10 cmH20 Plateau Pressure:  [18 cmH20-26 cmH20] 20 cmH20   Intake/Output Summary (Last 24 hours) at 09/20/2020 11/21/2020 Last data filed at 09/20/2020 0900 Gross per 24 hour  Intake 2940.28 ml  Output 2145 ml  Net 795.28 ml    Filed Weights   09/18/20 0600 09/19/20 0418 09/20/20 0419  Weight: 80.8 kg 82.7 kg 79.7 kg    Examination: General: Critically and chronically ill appearing M, appears  stated age, intubated sedated NAD  Neuro: will track to voice and follows commands weakly.  HEENT: NGT and ETT in place.  Cardiovascular: JVP elevated . HS normal, no edemal Lungs: Moderate respiratory distress on SBT with wheezes and rhonchi diffusely. 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  Acute hypoxic  respiratory failure due to H Flu PNA and Pulm edema - improving  VF arrest due to STEMI  STEMI, s/p LAD stent Acute systolic HFrEF Cardiogenic shock + vasodilatory shock related to medications  Hx CAD involving RCA LCX  AKI - Constipation  Hypernatremia due to decreased free water access.   Plan:  -Remains off  milrinone. -Failed SBT due to volume overload: Pink frothy sputum, markedly elevated BNP.  Will start furosemide infusion. -Attempt extubation once thoroughly diuresed. -Keep on dexmedetomidine for comfort. -No reintubation once extubated.  No tracheostomy per discussions with family.  Best Practice (right click and "Reselect all SmartList Selections" daily)   Diet/type: EN  Pain/Anxiety/Delirium protocol Yes and RASS goal: 0 to -1 VAP protocol (if indicated): Yes DVT prophylaxis: LMWH GI prophylaxis: PPI Glucose control:  SSI Central venous access:  Yes, and it is still needed Arterial line:  N/A Foley:  N/A and Yes, and it is still needed Mobility:  bed rest  PT consulted: N/A Code Status:  full code Last date of multidisciplinary goals of care discussion has 2 brothers in [place] and [place]. A coworker is working on Health and safety inspector for family in [place] (Coworker Steward Drone can be reached on [phone]) In ccm conversation w Dr. Jacinto Halim 6/29, dr Jacinto Halim indicated pt previously has expressed being estranged from family friends and not having a person he would have/want to make medical decisions.  Dr. Jacinto Halim informed me that he spoke with the patient's brother who has requested a transition to comfort care if we are unable to extubate the patient successfully. Disposition: remains critically ill, will stay in intensive care   CRITICAL CARE Performed by: Lynnell Catalan   Total critical care time: 40 minutes  Critical care time was exclusive of separately billable procedures and treating other patients. Critical care was necessary to treat or prevent imminent or life-threatening deterioration.  Critical care was time spent personally by me on the following activities: development of treatment plan with patient and/or surrogate as well as nursing, discussions with consultants, evaluation of patient's response to treatment, examination of patient, obtaining history from patient or surrogate,  ordering and performing treatments and interventions, ordering and review of laboratory studies, ordering and review of radiographic studies, pulse oximetry and re-evaluation of patient's condition.  Lynnell Catalan, MD [facility] ICU Physician [facility]  Pager: [phone] Or Epic Secure Chat After hours: [phone].  09/20/2020, 9:37 AM

## 2020-09-20 NOTE — Progress Notes (Signed)
eLink Physician-Brief Progress Note Patient Name: Jesse Lowe DOB: November 30, 1950 MRN: [id number]   Date of Service  09/20/2020  HPI/Events of Note  Multiple issues: 1. Patient admitted 6/19 and never screened for COVID per Nursing. Asymptomatic. 2. Hives.  eICU Interventions  Plan: Nasal swab for 6 hour TAT PCR D/C CHG wipes and see if hives clinically improved.      Intervention Category Major Interventions: Other:  Chyenne Sobczak Dennard Nip 09/20/2020, 10:04 PM

## 2020-09-21 LAB — GLUCOSE, CAPILLARY
Glucose-Capillary: 121 mg/dL — ABNORMAL HIGH (ref 70–99)
Glucose-Capillary: 131 mg/dL — ABNORMAL HIGH (ref 70–99)
Glucose-Capillary: 144 mg/dL — ABNORMAL HIGH (ref 70–99)
Glucose-Capillary: 160 mg/dL — ABNORMAL HIGH (ref 70–99)
Glucose-Capillary: 160 mg/dL — ABNORMAL HIGH (ref 70–99)
Glucose-Capillary: 219 mg/dL — ABNORMAL HIGH (ref 70–99)

## 2020-09-21 LAB — CBC
HCT: 35.8 % — ABNORMAL LOW (ref 39.0–52.0)
Hemoglobin: 11.2 g/dL — ABNORMAL LOW (ref 13.0–17.0)
MCH: 30.1 pg (ref 26.0–34.0)
MCHC: 31.3 g/dL (ref 30.0–36.0)
MCV: 96.2 fL (ref 80.0–100.0)
Platelets: 455 10*3/uL — ABNORMAL HIGH (ref 150–400)
RBC: 3.72 MIL/uL — ABNORMAL LOW (ref 4.22–5.81)
RDW: 14.6 % (ref 11.5–15.5)
WBC: 13.4 10*3/uL — ABNORMAL HIGH (ref 4.0–10.5)
nRBC: 0.2 % (ref 0.0–0.2)

## 2020-09-21 LAB — BASIC METABOLIC PANEL
Anion gap: 10 (ref 5–15)
BUN: 150 mg/dL — ABNORMAL HIGH (ref 8–23)
CO2: 28 mmol/L (ref 22–32)
Calcium: 8.1 mg/dL — ABNORMAL LOW (ref 8.9–10.3)
Chloride: 114 mmol/L — ABNORMAL HIGH (ref 98–111)
Creatinine, Ser: 2.61 mg/dL — ABNORMAL HIGH (ref 0.61–1.24)
GFR, Estimated: 26 mL/min — ABNORMAL LOW (ref >60)
Glucose, Bld: 150 mg/dL — ABNORMAL HIGH (ref 70–99)
Potassium: 3.2 mmol/L — ABNORMAL LOW (ref 3.5–5.1)
Sodium: 152 mmol/L — ABNORMAL HIGH (ref 135–145)

## 2020-09-21 LAB — SARS CORONAVIRUS 2 (TAT 6-24 HRS): SARS Coronavirus 2: NEGATIVE

## 2020-09-21 LAB — COOXEMETRY PANEL
Carboxyhemoglobin: 0.9 % (ref 0.5–1.5)
Methemoglobin: 0.9 % (ref 0.0–1.5)
O2 Saturation: 75.3 %
Total hemoglobin: 10.2 g/dL — ABNORMAL LOW (ref 12.0–16.0)

## 2020-09-21 MED ORDER — DEXAMETHASONE SODIUM PHOSPHATE 10 MG/ML IJ SOLN
8.0000 mg | Freq: Once | INTRAMUSCULAR | Status: AC
  Administered 2020-09-21: 8 mg via INTRAVENOUS
  Filled 2020-09-21: qty 0.8

## 2020-09-21 MED ORDER — POTASSIUM CHLORIDE 10 MEQ/50ML IV SOLN
10.0000 meq | INTRAVENOUS | Status: DC
  Administered 2020-09-21 (×2): 10 meq via INTRAVENOUS
  Filled 2020-09-21 (×3): qty 50

## 2020-09-21 MED ORDER — DIPHENHYDRAMINE HCL 50 MG/ML IJ SOLN
25.0000 mg | Freq: Four times a day (QID) | INTRAMUSCULAR | Status: DC | PRN
  Administered 2020-09-21: 25 mg via INTRAVENOUS
  Filled 2020-09-21: qty 1

## 2020-09-21 MED ORDER — POTASSIUM CHLORIDE 20 MEQ PO PACK
40.0000 meq | PACK | Freq: Two times a day (BID) | ORAL | Status: DC
  Administered 2020-09-21 – 2020-09-22 (×2): 40 meq
  Filled 2020-09-21 (×2): qty 2

## 2020-09-21 MED ORDER — POTASSIUM CHLORIDE 20 MEQ PO PACK
40.0000 meq | PACK | Freq: Two times a day (BID) | ORAL | Status: DC
  Administered 2020-09-21: 40 meq via ORAL
  Filled 2020-09-21: qty 2

## 2020-09-21 MED ORDER — NUTRISOURCE FIBER PO PACK
1.0000 | PACK | Freq: Two times a day (BID) | ORAL | Status: DC
  Administered 2020-09-21 – 2020-09-22 (×3): 1
  Filled 2020-09-21 (×4): qty 1

## 2020-09-21 MED ORDER — FREE WATER
300.0000 mL | Status: DC
  Administered 2020-09-21 – 2020-09-22 (×4): 300 mL

## 2020-09-21 NOTE — Progress Notes (Signed)
NAME:  Jesse Lowe, MRN:  [id number], DOB:  1950/09/29, LOS: 15 ADMISSION DATE:  September 08, 2020, CONSULTATION DATE:  6/20 REFERRING MD:  Jacinto Halim, CHIEF COMPLAINT:  delirium and respiratory distress    History of Present Illness:  70 yr old male admitted with anterior wall STEMI .went to cath lab emergently. Found to have lesion in LAD and high grade stenosis of the RCA and Circ. Underwent successful stent of LAD. And IAB placement. He was weaned off pressors. IABP dc'd am 6/20. Progressively more confused over course of day (had received Ambien that evening) 6/20 w/ increased WOB and agitation. PCCM consulted that afternoon for agitated delirium.   Pertinent  Medical History  HTN and HL Had not seen MD in 10-15 yrs.  Significant Hospital Events: Including procedures, antibiotic start and stop dates in addition to other pertinent events   6/19 admitted w/ STEMI. While waiting had VF arrest req'd ACLS w/ defib, amio epi; estimated 5 minutes to ROSC. Was awake and alert after ROSC obtained. Went to cath lab emergently. Found to have lesion in LAD and high grade stenosis of the RCA and Circ. Underwent successful stent of LAD. And IAB placement 6/20 off IABP. Pressors off. Progressive agitation and WOB. Intubated PCCM asked to consult 6/24 reintubated for shock, milrinone restarted Echo 09/07/2020 > Left ventricular ejection fraction, by estimation, is 20 to 25%. The left ventricle has severely decreased function. Left ventricular diastolic parameters were normal. There is akinesis of the left ventricular, entire anterior wall, anterolateral wall and apical Segment, mildly elevated pulmonary artery systolic pressure. The estimated right ventricular systolic pressure is 33.9 mmHg. Left atrial size was mildly dilated. 6/28 weaning sedation. Bronched with copious secretions, some hemoptysis seemed like from suction trauma 6/29 incr sedation. Having high RR with desats. Worse rhonchi, new wheeze. Adding  aggressive lasix. Adding versed gtt and gaba  6/30 versed changed to prop. Back on NE 7/1 weaning sedation and NE  7/2 milrinone stopped 7/3 SBT interrupted due to respiratory distress. Started on Furosemide infusion.   Interim History / Subjective:  Remains off milrinone. Follows commands off precedex but then becomes distressed. Tolerated SBT this am for several hours.  New rash on chest and legs Objective   Blood pressure 101/64, pulse 78, temperature (!) 100.4 F (38 C), resp. rate (!) 22, height 5\' 9"  (1.753 m), weight 79.8 kg, SpO2 98 %. CVP:  [6 mmHg-10 mmHg] 7 mmHg  Vent Mode: PSV FiO2 (%):  [40 %] 40 % Set Rate:  [22 bmp] 22 bmp Vt Set:  [490 mL] 490 mL PEEP:  [5 cmH20-8 cmH20] 5 cmH20 Pressure Support:  [10 cmH20] 10 cmH20 Plateau Pressure:  [18 cmH20-22 cmH20] 21 cmH20   Intake/Output Summary (Last 24 hours) at 09/21/2020 1422 Last data filed at 09/21/2020 1400 Gross per 24 hour  Intake 1467.33 ml  Output 3870 ml  Net -2402.67 ml    Filed Weights   09/19/20 0418 09/20/20 0419 09/21/20 0500  Weight: 82.7 kg 79.7 kg 79.8 kg    Examination: General: Critically and chronically ill appearing M, appears  stated age, intubated sedated NAD  Neuro: will open eyes  and follows commands weakly.  HEENT: NGT and ETT in place.  Cardiovascular: JVP not elevated . HS normal, no edemal Lungs: Moderate respiratory distress on SBT with wheezes and rhonchi diffusely. 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  Acute hypoxic respiratory failure due to H Flu PNA and Pulm edema - improving  VF arrest due to STEMI  STEMI, s/p LAD stent Acute systolic HFrEF Cardiogenic shock + vasodilatory shock related to medications  Hx CAD involving RCA LCX  AKI - improving with  diuresis Constipation  Hypernatremia due to decreased free water access.  New rash consistent with drug reaction.   Plan:  -Remains off milrinone. -Appears more adequately diuresed  -Extubate once more awake. Patient has decent cough and we may now be getting into a vicious cycle of sedation and agitation from tube intolerance.  -Keep on dexmedetomidine for comfort. -No reintubation once extubated.  No tracheostomy per discussions with family. - Stop CHG. Continue furosemide for now and stop if rash continues to worsen. Avoid benadryl as will cause oversedation.  - have also stopped seroquel and gabapentin  Best Practice (right click and "Reselect all SmartList Selections" daily)   Diet/type: EN  Pain/Anxiety/Delirium protocol Yes and RASS goal: 0 to -1 VAP protocol (if indicated): Yes DVT prophylaxis: LMWH GI prophylaxis: PPI Glucose control:  SSI Central venous access:  Yes, and it is still needed Arterial line:  N/A Foley:  N/A and Yes, and it is still needed Mobility:  bed rest  PT consulted: N/A Code Status:  full code Last date of multidisciplinary goals of care discussion has 2 brothers in [place] and [place]. A coworker is working on Health and safety inspector for family in [place] ([employer]er Steward Drone can be reached on [phone]) In ccm conversation w Dr. Jacinto Halim 6/29, dr Jacinto Halim indicated pt previously has expressed being estranged from family friends and not having a person he would have/want to make medical decisions.  Dr. Jacinto Halim informed me that he spoke with the patient's brother who has requested a transition to comfort care if we are unable to extubate the patient successfully. Disposition: remains critically ill, will stay in intensive care   CRITICAL CARE Performed by: Lynnell Catalan   Total critical care time: 40 minutes  Critical care time was exclusive of separately billable procedures and treating other patients. Critical care was necessary to treat or prevent imminent  or life-threatening deterioration.  Critical care was time spent personally by me on the following activities: development of treatment plan with patient and/or surrogate as well as nursing, discussions with consultants, evaluation of patient's response to treatment, examination of patient, obtaining history from patient or surrogate, ordering and performing treatments and interventions, ordering and review of laboratory studies, ordering and review of radiographic studies, pulse oximetry and re-evaluation of patient's condition.  Lynnell Catalan, MD [facility] ICU Physician [facility] [place] [facility]  Pager: [phone] Or Epic Secure Chat After hours: [phone].  09/21/2020, 2:22 PM

## 2020-09-21 NOTE — Progress Notes (Signed)
eLink Physician-Brief Progress Note Patient Name: Jesse Lowe DOB: 07-02-1950 MRN: [id number]   Date of Service  09/21/2020  HPI/Events of Note  Hypokalemia - K+ = 3.2 and Creatinine = 2.61. Patient is on a Lasix IV infusion.  eICU Interventions  Will replace K+.     Intervention Category Major Interventions: Electrolyte abnormality - evaluation and management  [name] 09/21/2020, 6:37 AM

## 2020-09-21 NOTE — Progress Notes (Signed)
Subjective:   Patient remains intubated, was in mild respiratory distress this morning since Precedex was discontinued.  Appears to follow some commands as per nursing notes earlier in the morning but this afternoon does not appear to follow any commands.  Has developed diffuse rash.  Intake/Output from previous day:  I/O last 3 completed shifts: In: 3850.3 [I.V.:714.8; Other:45; NG/GT:2980; IV Piggyback:110.5] Out: 5075 [FFMBW:4665; LDJTT:0177] Total I/O In: 228.6 [I.V.:39.1; IV Piggyback:189.5] Out: 1425 [Urine:1425]  Blood pressure 119/72, pulse 100, temperature 99.86 F (37.7 C), resp. rate (!) 24, height _0  (1.753 m), weight 79.8 kg, SpO2 96 %.  Vitals with BMI 09/21/2020 09/21/2020 09/21/2020  Height - - -  Weight - - -  BMI - - -  Systolic 939 030 092  Diastolic 72 62 64  Pulse 330 93 78     Physical Exam Vitals reviewed.  Constitutional:      General: He is in acute distress (Mild tachypnea and respiratory distress).     Appearance: He is ill-appearing. He is not diaphoretic.     Interventions: He is sedated and intubated.  HENT:     Head: Normocephalic and atraumatic.  Neck:     Vascular: No carotid bruit.     Comments: Could not evaluate JVD. Cardiovascular:     Rate and Rhythm: Regular rhythm. Tachycardia present.     Pulses: Normal pulses and intact distal pulses.     Heart sounds: S1 normal and S2 normal. No murmur heard.   No gallop.  Pulmonary:     Effort: No respiratory distress. He is intubated.     Breath sounds: Normal air entry. Rales (Right basal crackles, faint left basal crackles) present. No wheezing or rhonchi.  Abdominal:     General: Bowel sounds are normal. There is no distension.  Musculoskeletal:     Right lower leg: No edema.     Left lower leg: No edema.  Skin:    General: Skin is warm.     Capillary Refill: Capillary refill takes less than 2 seconds.     Findings: Rash (Diffuse generalized rash) present.  Neurological:     Mental  Status: He is disoriented.  5.  Lab Results:  BNP (last 3 results) Recent Labs    09/18/20 1125 09/19/20 0408 09/20/20 0322  BNP 2,719.0* 3,486.3* >4,500.0*    ProBNP (last 3 results) No results for input(s): PROBNP in the last 8760 hours. BMP Latest Ref Rng & Units 09/21/2020 09/20/2020 09/19/2020  Glucose 70 - 99 mg/dL 150(H) 160(H) 166(H)  BUN 8 - 23 mg/dL 150(H) 137(H) 115(H)  Creatinine 0.61 - 1.24 mg/dL 2.61(H) 3.00(H) 2.74(H)  Sodium 135 - 145 mmol/L 152(H) 152(H) 155(H)  Potassium 3.5 - 5.1 mmol/L 3.2(L) 4.1 3.9  Chloride 98 - 111 mmol/L 114(H) 112(H) 118(H)  CO2 22 - 32 mmol/L _1 Calcium 8.9 - 10.3 mg/dL 8.1(L) 8.5(L) 8.2(L)   Hepatic Function Latest Ref Rng & Units 09/20/2020 09/19/2020 09/19/2020  Total Protein 6.5 - 8.1 g/dL - - -  Albumin 3.5 - 5.0 g/dL 2.1(L) 2.1(L) 2.1(L)  AST 15 - 41 U/L - - -  ALT 0 - 44 U/L - - -  Alk Phosphatase 38 - 126 U/L - - -  Total Bilirubin 0.3 - 1.2 mg/dL - - -  Bilirubin, Direct 0.0 - 0.2 mg/dL - - -   CBC Latest Ref Rng & Units 09/21/2020 09/20/2020 09/19/2020  WBC 4.0 - 10.5 K/uL 13.4(H) 11.5(H) 12.8(H)  Hemoglobin 13.0 -  17.0 g/dL 11.2(L) 9.9(L) 10.0(L)  Hematocrit 39.0 - 52.0 % 35.8(L) 33.1(L) 31.2(L)  Platelets 150 - 400 K/uL 455(H) 423(H) 411(H)   Lipid Panel     Component Value Date/Time   CHOL 182 09/09/2020 1551   TRIG 166 (H) 09/19/2020 0408   HDL 31 (L) 09/08/2020 1551   CHOLHDL 5.9 09/07/2020 1551   VLDL 45 (H) 09/07/2020 1551   LDLCALC 106 (H) 08/31/2020 1551    HEMOGLOBIN A1C Lab Results  Component Value Date   HGBA1C 5.3 09/07/2020   MPG 105 09/07/2020   TSH Recent Labs    08/19/2020 1859  TSH 2.235    BNP (last 3 results) Recent Labs    09/18/20 1125 09/19/20 0408 09/20/20 0322  BNP 2,719.0* 3,486.3* >4,500.0*   Imaging:  PORTABLE CHEST 1 VIEW 09/18/2020:   COMPARISON:  09/17/2020 and prior studies FINDINGS: An endotracheal tube with tip 4 cm above the carina, RIGHT PICC line with tip  overlying the SUPERIOR cavoatrial junction and enteric tube entering the stomach with tip off the field of view again noted.   Mild pulmonary vascular congestion and mild bibasilar atelectasis again noted.   There is no evidence of pneumothorax.Portable chest x-ray 09/09/2020:  Central lines and tubes in stable position.  Cardiomegaly again noted.  Bilateral pulmonary infiltrates/edema again noted.  Findings suggestive of CHF with bilateral pleural effusion.  DG Chest X-ray 09/07/2020:  Cardiac shadow is within normal limits. Prior coronary stenting is seen. Intra-aortic balloon pump is noted just below the aortic knob. Lungs are well aerated bilaterally with mild interstitial edema.  IMPRESSION: Mild interstitial edema. Balloon pump in satisfactory position.  Cardiac Studies: Left heart catheterization 09/04/2020: LV 56/15, EDP 21 mmHg.  Aorta 75/44, mean 57 mmHg.  No pressure gradient across the aortic valve.  Markedly elevated EDP. LM: Large vessel, bifurcates into circumflex and LAD. LAD: Has ulcerated subtotally occluded proximal LAD stenosis that extends to the mid segment.  There is TIMI I flow also in the D1 and D2.  Moderate-sized D1 and small sized D2.  Mild disease in the mid to distal LAD. CX: Moderate sized vessel, gives origin to a large OM1, OM 2 is very small, there is a lesion after OM 2 at 80% focal lesion.  OM 3 is moderate sized with secondary branches. RCA: Dominant.  Proximal segment 90% stenosis, tandem 30 to 40% stenosis in the midsegment followed by a high-grade 90% stenosis.  Mild disease in the distal right coronary.   Intervention: Successful 2 overlapping stent implantation with 3.0 x 24 mm followed by 3.0 x 16 mm Synergy XD stents, stenosis reduced from 99% to 0%, TIMI I improved to TIMI-3 flow.   Recommendation: Patient will need relook at the LAD stent in view of severe spasm and cardiogenic shock and need for pressor support in the cardiac catheterization lab.   Patient was started on Levophed due to low blood pressure and intra-aortic balloon pump was inserted percutaneously prior to angioplasty.  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  115 mL contrast utilized.  Echocardiogram 09/16/2020:  1. Left ventricular ejection fraction, by estimation, is 20 to 25%. Left ventricular ejection fraction by PLAX is 35 %. The left ventricle has severely decreased function. The left ventricle demonstrates regional wall motion abnormalities (see scoring diagram/findings for description). Left ventricular diastolic function could not be evaluated. There is akinesis of the  left ventricular, entire anterolateral wall, anterior wall and apical segment. There is akinesis of the left ventricular, entire apical segment.  2. Right ventricular systolic function was not well visualized. The right ventricular size is normal.  3. The mitral valve is normal in structure. No evidence of mitral valve regurgitation. No evidence of mitral stenosis.  4. The aortic valve is normal in structure. Aortic valve regurgitation is not visualized. No aortic stenosis is present.  5. There is mild dilatation of the aortic root, measuring 39 mm.  6. The inferior vena cava is dilated in size with <50% respiratory variability, suggesting right atrial pressure of 15 mmHg.   Comparison(s): Compared to 09/11/2020, LVEF is improved marginally from 10 to 15% to the present 25% to 30%.  EKG:   EKG 09/10/2020: Normal sinus rhythm at rate of 83 bpm, normal axis, poor R wave progression, cannot exclude anteroseptal infarct old.  Nonspecific ST-T abnormality.  Compared to 09/15/2020, minimal anterolateral ST elevation not present.  08/28/2020: 1557 hrs.: Ventricular fibrillation. 09/14/2020 at 1550 hrs.: Anteroseptal and high lateral STEMI.   Telemetry: Sinus rhythm with runs of nonsustained  ventricular tachycardia 6-8 beats  Scheduled Meds:  aspirin  81 mg Per Tube Daily   atorvastatin  80 mg Per Tube Daily   chlorhexidine gluconate (MEDLINE KIT)  15 mL Mouth Rinse BID   docusate  100 mg Per Tube BID   enoxaparin (LOVENOX) injection  30 mg Subcutaneous Q24H   feeding supplement (PROSource TF)  45 mL Per Tube BID   fentaNYL (SUBLIMAZE) injection  25 mcg Intravenous Once   fiber  1 packet Per Tube BID   folic acid  1 mg Per Tube Daily   free water  300 mL Per Tube Q4H   insulin aspart  0-15 Units Subcutaneous Q4H   mouth rinse  15 mL Mouth Rinse 10 times per day   multivitamin with minerals  1 tablet Per Tube Daily   pantoprazole sodium  40 mg Per Tube Daily   polyethylene glycol  17 g Per Tube Daily   potassium chloride  40 mEq Per Tube BID   sodium chloride flush  10-40 mL Intracatheter Q12H   sodium chloride flush  3 mL Intravenous Q12H   thiamine injection  100 mg Intravenous Daily   ticagrelor  90 mg Per Tube BID   Continuous Infusions:  sodium chloride Stopped (09/21/20 0647)   cefTRIAXone (ROCEPHIN)  IV Stopped (09/21/20 1102)   dexmedetomidine (PRECEDEX) IV infusion Stopped (09/21/20 1351)   feeding supplement (VITAL 1.5 CAL) Stopped (09/21/20 1114)   furosemide (LASIX) 200 mg in dextrose 5% 100 mL ($Remov'2mg'AxDgjr$ /mL) infusion 4 mg/hr (09/21/20 1400)   PRN Meds:.acetaminophen (TYLENOL) oral liquid 160 mg/5 mL, albuterol, bisacodyl, diphenhydrAMINE, fentaNYL (SUBLIMAZE) injection, Gerhardt's butt cream, nitroGLYCERIN, ondansetron (ZOFRAN) IV, sodium chloride flush, sodium chloride flush  Assessment/Plan:   1.  Cardiogenic shock.  2.  Heart failure with reduced ejection fraction,  Ischemic cardiomyopathy 3.  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  4.  Respiratory failure with hypoxemia  Recommendation:   Has had excellent diuresis over the past 48 hours with IV furosemide.  Patient has now developed significant rash, etiology not known.   Vitals are remaining stable.  Renal function appears to be stable.  Mental status still remains precarious.  He has not been responding in spite of being off of sedatives this afternoon.  I discussed with his brother Wille Glaser, advised him that his brother has not been  doing well and we plan to aggressively diurese him to give him the best chance of recovery and then extubate him tomorrow.  There will be no reintubation and if patient does go into respiratory distress, he will make comfort care and palliative care.  His brother is in agreement with the plan, he plans on being here on Thursday but is aware that patient may not survive after the extubation.  I discussed the plans with the nurse Myriam Jacobson as well.  Touch base with PCCM Dr. Doyne Keel.  He is now off of Precedex and also all inotropes.  Respiratory and cardiac situation continues to be extremely guarded with poor outcomes.  I spent 40 minutes in review of his charts, making complex decisions, discussions of end-of-life issues with his estranged brother over the telephone and discussions with staff nurse.  40 minutes of critical care time   Adrian Prows, MD, [facility] 09/21/2020, 5:01 PM Office: [phone] Fax: [phone] Pager: [phone]

## 2020-09-22 DIAGNOSIS — N170 Acute kidney failure with tubular necrosis: Secondary | ICD-10-CM

## 2020-09-22 LAB — BASIC METABOLIC PANEL
Anion gap: 11 (ref 5–15)
BUN: 139 mg/dL — ABNORMAL HIGH (ref 8–23)
CO2: 30 mmol/L (ref 22–32)
Calcium: 8.2 mg/dL — ABNORMAL LOW (ref 8.9–10.3)
Chloride: 118 mmol/L — ABNORMAL HIGH (ref 98–111)
Creatinine, Ser: 2.21 mg/dL — ABNORMAL HIGH (ref 0.61–1.24)
GFR, Estimated: 32 mL/min — ABNORMAL LOW (ref >60)
Glucose, Bld: 205 mg/dL — ABNORMAL HIGH (ref 70–99)
Potassium: 4 mmol/L (ref 3.5–5.1)
Sodium: 159 mmol/L — ABNORMAL HIGH (ref 135–145)

## 2020-09-22 LAB — COOXEMETRY PANEL
Carboxyhemoglobin: 0.9 % (ref 0.5–1.5)
Methemoglobin: 1 % (ref 0.0–1.5)
O2 Saturation: 84.2 %
Total hemoglobin: 12.7 g/dL (ref 12.0–16.0)

## 2020-09-22 LAB — CBC
HCT: 38.8 % — ABNORMAL LOW (ref 39.0–52.0)
Hemoglobin: 12 g/dL — ABNORMAL LOW (ref 13.0–17.0)
MCH: 29.9 pg (ref 26.0–34.0)
MCHC: 30.9 g/dL (ref 30.0–36.0)
MCV: 96.5 fL (ref 80.0–100.0)
Platelets: 495 10*3/uL — ABNORMAL HIGH (ref 150–400)
RBC: 4.02 MIL/uL — ABNORMAL LOW (ref 4.22–5.81)
RDW: 14.7 % (ref 11.5–15.5)
WBC: 18.2 10*3/uL — ABNORMAL HIGH (ref 4.0–10.5)
nRBC: 0.3 % — ABNORMAL HIGH (ref 0.0–0.2)

## 2020-09-22 LAB — MAGNESIUM: Magnesium: 4 mg/dL — ABNORMAL HIGH (ref 1.7–2.4)

## 2020-09-22 LAB — GLUCOSE, CAPILLARY: Glucose-Capillary: 159 mg/dL — ABNORMAL HIGH (ref 70–99)

## 2020-09-22 MED ORDER — DIPHENHYDRAMINE HCL 50 MG/ML IJ SOLN: 25.0000 mg | INTRAMUSCULAR | Status: DC | PRN

## 2020-09-22 MED ORDER — MORPHINE BOLUS VIA INFUSION
5.0000 mg | INTRAVENOUS | Status: DC | PRN
  Filled 2020-09-22: qty 5

## 2020-09-22 MED ORDER — GLYCOPYRROLATE 1 MG PO TABS
1.0000 mg | ORAL_TABLET | ORAL | Status: DC | PRN
  Filled 2020-09-22: qty 1

## 2020-09-22 MED ORDER — MORPHINE 100MG IN NS 100ML (1MG/ML) PREMIX INFUSION
0.0000 mg/h | INTRAVENOUS | Status: DC
  Administered 2020-09-22: 5 mg/h via INTRAVENOUS
  Administered 2020-09-23: 15 mg/h via INTRAVENOUS
  Administered 2020-09-23: 10 mg/h via INTRAVENOUS
  Filled 2020-09-22 (×4): qty 100

## 2020-09-22 MED ORDER — ACETAMINOPHEN 650 MG RE SUPP: 650.0000 mg | Freq: Four times a day (QID) | RECTAL | Status: DC | PRN

## 2020-09-22 MED ORDER — POLYVINYL ALCOHOL 1.4 % OP SOLN
1.0000 [drp] | Freq: Four times a day (QID) | OPHTHALMIC | Status: DC | PRN
  Filled 2020-09-22: qty 15

## 2020-09-22 MED ORDER — GLYCOPYRROLATE 0.2 MG/ML IJ SOLN
0.2000 mg | INTRAMUSCULAR | Status: DC | PRN
  Administered 2020-09-22 – 2020-09-23 (×3): 0.2 mg via INTRAVENOUS
  Filled 2020-09-22 (×3): qty 1

## 2020-09-22 MED ORDER — MORPHINE SULFATE (PF) 2 MG/ML IV SOLN: 2.0000 mg | INTRAVENOUS | Status: DC | PRN

## 2020-09-22 MED ORDER — DEXTROSE 5 % IV SOLN: INTRAVENOUS | Status: DC

## 2020-09-22 MED ORDER — ACETAMINOPHEN 325 MG PO TABS: 650.0000 mg | ORAL_TABLET | Freq: Four times a day (QID) | ORAL | Status: DC | PRN

## 2020-09-22 MED ORDER — GLYCOPYRROLATE 0.2 MG/ML IJ SOLN: 0.2000 mg | INTRAMUSCULAR | Status: DC | PRN

## 2020-09-22 NOTE — Progress Notes (Signed)
NAME:  Jesse Lowe, MRN:  [id number], DOB:  1951/03/19, LOS: 16 ADMISSION DATE:  October 02, 2020, CONSULTATION DATE:  6/20 REFERRING MD:  Jacinto Halim, CHIEF COMPLAINT:  delirium and respiratory distress    History of Present Illness:  70 yr old male admitted with anterior wall STEMI .went to cath lab emergently. Found to have lesion in LAD and high grade stenosis of the RCA and Circ. Underwent successful stent of LAD. And IAB placement. He was weaned off pressors. IABP dc'd am 6/20. Progressively more confused over course of day (had received Ambien that evening) 6/20 w/ increased WOB and agitation. PCCM consulted that afternoon for agitated delirium.   Pertinent  Medical History  HTN and HL Had not seen MD in 10-15 yrs.  Significant Hospital Events: Including procedures, antibiotic start and stop dates in addition to other pertinent events   6/19 admitted w/ STEMI. While waiting had VF arrest req'd ACLS w/ defib, amio epi; estimated 5 minutes to ROSC. Was awake and alert after ROSC obtained. Went to cath lab emergently. Found to have lesion in LAD and high grade stenosis of the RCA and Circ. Underwent successful stent of LAD. And IAB placement 6/20 off IABP. Pressors off. Progressive agitation and WOB. Intubated PCCM asked to consult 6/24 reintubated for shock, milrinone restarted Echo 09/07/2020 > Left ventricular ejection fraction, by estimation, is 20 to 25%. The left ventricle has severely decreased function. Left ventricular diastolic parameters were normal. There is akinesis of the left ventricular, entire anterior wall, anterolateral wall and apical Segment, mildly elevated pulmonary artery systolic pressure. The estimated right ventricular systolic pressure is 33.9 mmHg. Left atrial size was mildly dilated. 6/28 weaning sedation. Bronched with copious secretions, some hemoptysis seemed like from suction trauma 6/29 incr sedation. Having high RR with desats. Worse rhonchi, new wheeze. Adding  aggressive lasix. Adding versed gtt and gaba  6/30 versed changed to prop. Back on NE 7/1 weaning sedation and NE  7/2 milrinone stopped 7/3 SBT interrupted due to respiratory distress. Started on Furosemide infusion.   Interim History / Subjective:  Off milrinone and lasix drip. Off precedex this morning while on PS 10/5: weakly wiggles toes to command; cough gag reflex in tact with small/moderate thick secretions; unable to stick tongue out and lift neck off bed. Failed SBT due to tachypnea; placed back on original PRVC settings to rest Rash on chest and legs improving after stopping lasix and CHG wipes  Objective   Blood pressure 121/77, pulse 94, temperature (!) 100.58 F (38.1 C), resp. rate (!) 27, height 5\' 9"  (1.753 m), weight 79.9 kg, SpO2 96 %. CVP:  [6 mmHg-10 mmHg] 8 mmHg  Vent Mode: PSV FiO2 (%):  [40 %] 40 % Set Rate:  [22 bmp] 22 bmp Vt Set:  [490 mL] 490 mL PEEP:  [5 cmH20-8 cmH20] 5 cmH20 Pressure Support:  [10 cmH20] 10 cmH20 Plateau Pressure:  [19 cmH20-22 cmH20] 21 cmH20   Intake/Output Summary (Last 24 hours) at 09/22/2020 0851 Last data filed at 09/22/2020 0700 Gross per 24 hour  Intake 1919.38 ml  Output 2915 ml  Net -995.62 ml    Filed Weights   09/20/20 0419 09/21/20 0500 09/22/20 0500  Weight: 79.7 kg 79.8 kg 79.9 kg    Examination: General:  Critically ill intubated patient HEENT: MM pink/moist; ETT in place Neuro:  weakly wiggles toes and open eyes to command; cough gag reflex in tact; does not squeeze fingers to command CV: s1s2, no m/r/g PULM:  dim clear bs  bilaterally; on vent PRVC; failed SBT due to tachypnea GI: soft, bsx4 active  Extremities: warm/dry, no edema  Skin: no rash appreciated on legs; pink maculopapular rash on chest improving from yesterday  Labs: Na 159 (from 152) Creatinine 2.21 (2.61); BUN 139 (from 150) K 4, Mag 4 WBC 18.2, Hgb 12  Resolved Problem List: -Cardiogenic shock + vasodilatory shock related to medications    Assessment & Plan:   Acute metabolic encephalopathy due critical illness and 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  P: -Continue to wean off precedex as tolerated. -limit sedating meds  Acute hypoxic respiratory failure due to H Flu PNA and Pulm edema - improving  P: -Continue PRVC -Failed SBT today due to tachypnea -Continue ceftriaxone -Vap prevention  VF arrest due to STEMI  STEMI, s/p LAD stent Acute systolic HFrEF Hx CAD involving RCA LCX  P: -Cards following -Off milrinone and lasix drip  AKI  P: -Trend BMP/UO -Avoid nephrotoxic drugs; renal dose meds  Hypernatremia due to decreased free water access.  P: -trend BMP -consider increasing free water  New rash consistent with drug reaction.  P: -Lasix and CHG stopped yesterday and rash improving -Benadryl given yesterday  Hyperglycemia P: -ssi and cbg monitoring -consider increasing ssi if glucose remains elevated  Constipation  P: -continue bowel regimen  GOC P: -Will speak with cardiology and family to decide whether to one way extubation today or if family wants to wait a few more days. -Per previous family discussion: patient is DNR. No reintubation once extubated and no tracheostomy  Best Practice (right click and "Reselect all SmartList Selections" daily)   Diet/type: EN  Pain/Anxiety/Delirium protocol Yes and RASS goal: 0 to -1 VAP protocol (if indicated): Yes DVT prophylaxis: LMWH GI prophylaxis: PPI Glucose control:  SSI Central venous access:  Yes, and it is still needed Arterial line:  N/A Foley:  N/A and Yes, and it is still needed Mobility:  bed rest  PT consulted: N/A Code Status:  full code Last date of multidisciplinary goals of care discussion has 2 brothers in [place] and [place]. A coworker is working on Health and safety inspector for family in [place] (Coworker Steward Drone can be reached on [phone]) In ccm conversation w Dr. Jacinto Halim 6/29, dr Jacinto Halim indicated pt previously has  expressed being estranged from family friends and not having a person he would have/want to make medical decisions.  Dr. Jacinto Halim informed me that he spoke with the patient's brother who has requested a transition to comfort care if we are unable to extubate the patient successfully. Disposition: remains critically ill, will stay in intensive care   CRITICAL CARE Performed by: Lidia Collum   Total critical care time: 35 minutes  Critical care time was exclusive of separately billable procedures and treating other patients. Critical care was necessary to treat or prevent imminent or life-threatening deterioration.  Critical care was time spent personally by me on the following activities: development of treatment plan with patient and/or surrogate as well as nursing, discussions with consultants, evaluation of patient's response to treatment, examination of patient, obtaining history from patient or surrogate, ordering and performing treatments and interventions, ordering and review of laboratory studies, ordering and review of radiographic studies, pulse oximetry and re-evaluation of patient's condition.  JD Anselm Lis  Pulmonary & Critical Care 09/22/2020, 8:51 AM  Please see [email or web address] for pager details.  From 7A-7P if no response, please call [phone]. After hours, please call ELink [phone].   09/22/2020, 8:51 AM

## 2020-09-22 NOTE — Progress Notes (Signed)
Nutrition Follow-up  DOCUMENTATION CODES:   Non-severe (moderate) malnutrition in context of chronic illness  INTERVENTION:   Tube feeding:  -Vital 1.5 @ 60 ml/hr (1440 ml) via Cortrak  -ProSource TF 45 ml BID -Free water 300 ml Q4 hours   At goal rate TF provides: 2240 kcals, 127 grams protein, 1100 ml free water (2900 ml with flushes).   NUTRITION DIAGNOSIS:   Moderate Malnutrition related to chronic illness as evidenced by mild muscle depletion, mild fat depletion.  Ongoing  GOAL:   Patient will meet greater than or equal to 90% of their needs  Addressed via TF  MONITOR:   Vent status, TF tolerance, Labs, Weight trends  REASON FOR ASSESSMENT:   Consult Enteral/tube feeding initiation and management  ASSESSMENT:   70 yo admitted with chest pain, Code STEMI activated. While waiting i, pt had VF arrest, taking to cath lab and underwent stent to LAD and IABP, intubated. PMH includes HNT, HLD; pt had not seen MD in 10-15 years  6/19 Admitted with STEMI, VF arrest, Cath Lab with stent to LAD and IABP 6/20 IABP removed 6/22 Self extubated 6/24 re-intubated 2/2 cardiogenic shock 6/29 post pyloric Cortrak placed  Pt discussed during ICU rounds and with RN.   Off lasix drip. Unable to tolerate SBT. Weaning precedex as tolerated. Hypernatremia persist. Free water added per CCM. Tolerating tube feeding at goal. Nutrisource added for loose bowel movements. GOC discussions are ongoing. Continue current TF regimen.   Admission weight: 90.4 kg  Current weight: 79.9 kg   UOP: 2915 ml x 24 hrs   Drips: precedex Medications: colace, nutrisource fiber BID, folic acid, SS novolog, miralax, 40 mEq Kcl BID, thiamine  Labs: Na 159 (H) Mg 4.0 (H) Cr 2.21- trending down  Diet Order:   Diet Order             Diet NPO time specified  Diet effective now                  EDUCATION NEEDS:  Not appropriate for education at this time  Skin:  Skin Assessment: Reviewed RN  Assessment  Last BM:  7/5  Height:  Ht Readings from Last 1 Encounters:  08/26/2020 5\' 9"  (1.753 m)   Weight:  Wt Readings from Last 1 Encounters:  09/22/20 79.9 kg   BMI:  Body mass index is 26.01 kg/m.  Estimated Nutritional Needs:   Kcal:  2100-2300 kcal  Protein:  115-130 grams  Fluid:  >/= 2 L/day  11/23/20 MS, RD, LDN, CNSC Clinical Nutrition Pager listed in AMION

## 2020-09-22 NOTE — Procedures (Signed)
Extubation Procedure Note  Patient Details:   Name: Jesse Lowe DOB: 25-Jun-1950 MRN: [id number]   Airway Documentation:    Vent end date: 09/22/20 Vent end time: 1812   Evaluation  O2 sats: stable throughout Complications: No apparent complications Patient did tolerate procedure well. Bilateral Breath Sounds: Diminished   No  Shelden Raborn 09/22/2020, 6:13 PM

## 2020-09-22 NOTE — Progress Notes (Addendum)
Subjective:   Patient remains intubated, rash has subsided after lasix gtt discontinued yesterday.  No overnight issues.   Intake/Output from previous day:  I/O last 3 completed shifts: In: 2560.3 [I.V.:421.3; NG/GT:1939; IV [id number]:915] Out: 0569 [Urine:4510; Stool:100] Total I/O In: 9.1 [I.V.:9.1] Out: 190 [Urine:190]  Blood pressure 121/77, pulse 94, temperature (!) 100.58 F (38.1 C), resp. rate (!) 27, height 5' 9"  (1.753 m), weight 79.9 kg, SpO2 96 %.  Vitals with BMI 09/22/2020 09/22/2020 09/22/2020  Height - - -  Weight - - 176 lbs 2 oz  BMI - - 26  Systolic 794 801 655  Diastolic 77 76 75  Pulse 94 96 95     Physical Exam Vitals reviewed.  Constitutional:      General: He is in acute distress (Mild tachypnea and respiratory distress).     Appearance: He is ill-appearing. He is not diaphoretic.     Interventions: He is sedated and intubated.  HENT:     Head: Normocephalic and atraumatic.  Neck:     Vascular: No carotid bruit.     Comments: Could not evaluate JVD. Cardiovascular:     Rate and Rhythm: Regular rhythm. Tachycardia present.     Pulses: Normal pulses and intact distal pulses.     Heart sounds: S1 normal and S2 normal. No murmur heard.   No gallop.  Pulmonary:     Effort: No respiratory distress. He is intubated.     Breath sounds: Normal air entry. Rales (Right basal crackles, faint left basal crackles) present. No wheezing or rhonchi.  Abdominal:     General: Bowel sounds are normal. There is no distension.  Musculoskeletal:     Right lower leg: No edema.     Left lower leg: No edema.  Skin:    General: Skin is warm.     Capillary Refill: Capillary refill takes less than 2 seconds.     Findings: No rash.  Neurological:     Mental Status: He is disoriented.     Comments: Opens eyes, but otherwise does not follow any commands. Does not verbalize.     Lab Results:  BNP (last 3 results) Recent Labs    09/18/20 1125 09/19/20 0408  09/20/20 0322  BNP 2,719.0* 3,486.3* >4,500.0*    ProBNP (last 3 results) No results for input(s): PROBNP in the last 8760 hours. BMP Latest Ref Rng & Units 09/21/2020 09/20/2020 09/19/2020  Glucose 70 - 99 mg/dL 150(H) 160(H) 166(H)  BUN 8 - 23 mg/dL 150(H) 137(H) 115(H)  Creatinine 0.61 - 1.24 mg/dL 2.61(H) 3.00(H) 2.74(H)  Sodium 135 - 145 mmol/L 152(H) 152(H) 155(H)  Potassium 3.5 - 5.1 mmol/L 3.2(L) 4.1 3.9  Chloride 98 - 111 mmol/L 114(H) 112(H) 118(H)  CO2 22 - 32 mmol/L 28 26 27   Calcium 8.9 - 10.3 mg/dL 8.1(L) 8.5(L) 8.2(L)   Hepatic Function Latest Ref Rng & Units 09/20/2020 09/19/2020 09/19/2020  Total Protein 6.5 - 8.1 g/dL - - -  Albumin 3.5 - 5.0 g/dL 2.1(L) 2.1(L) 2.1(L)  AST 15 - 41 U/L - - -  ALT 0 - 44 U/L - - -  Alk Phosphatase 38 - 126 U/L - - -  Total Bilirubin 0.3 - 1.2 mg/dL - - -  Bilirubin, Direct 0.0 - 0.2 mg/dL - - -   CBC Latest Ref Rng & Units 09/22/2020 09/21/2020 09/20/2020  WBC 4.0 - 10.5 K/uL 18.2(H) 13.4(H) 11.5(H)  Hemoglobin 13.0 - 17.0 g/dL 12.0(L) 11.2(L) 9.9(L)  Hematocrit 39.0 - 52.0 %  38.8(L) 35.8(L) 33.1(L)  Platelets 150 - 400 K/uL 495(H) 455(H) 423(H)   Lipid Panel     Component Value Date/Time   CHOL 182 09/01/2020 1551   TRIG 166 (H) 09/19/2020 0408   HDL 31 (L) 09/17/2020 1551   CHOLHDL 5.9 09/16/2020 1551   VLDL 45 (H) 08/29/2020 1551   LDLCALC 106 (H) 09/14/2020 1551    HEMOGLOBIN A1C Lab Results  Component Value Date   HGBA1C 5.3 09/07/2020   MPG 105 09/07/2020   TSH Recent Labs    09/02/2020 1859  TSH 2.235    BNP (last 3 results) Recent Labs    09/18/20 1125 09/19/20 0408 09/20/20 0322  BNP 2,719.0* 3,486.3* >4,500.0*   Imaging:  PORTABLE CHEST 1 VIEW 09/18/2020:   COMPARISON:  09/17/2020 and prior studies FINDINGS: An endotracheal tube with tip 4 cm above the carina, RIGHT PICC line with tip overlying the SUPERIOR cavoatrial junction and enteric tube entering the stomach with tip off the field of view again noted.    Mild pulmonary vascular congestion and mild bibasilar atelectasis again noted.   There is no evidence of pneumothorax.Portable chest x-ray 09/09/2020:  Central lines and tubes in stable position.  Cardiomegaly again noted.  Bilateral pulmonary infiltrates/edema again noted.  Findings suggestive of CHF with bilateral pleural effusion.  DG Chest X-ray 08/27/2020:  Cardiac shadow is within normal limits. Prior coronary stenting is seen. Intra-aortic balloon pump is noted just below the aortic knob. Lungs are well aerated bilaterally with mild interstitial edema.  IMPRESSION: Mild interstitial edema. Balloon pump in satisfactory position.  Cardiac Studies: Left heart catheterization 08/30/2020: LV 56/15, EDP 21 mmHg.  Aorta 75/44, mean 57 mmHg.  No pressure gradient across the aortic valve.  Markedly elevated EDP. LM: Large vessel, bifurcates into circumflex and LAD. LAD: Has ulcerated subtotally occluded proximal LAD stenosis that extends to the mid segment.  There is TIMI I flow also in the D1 and D2.  Moderate-sized D1 and small sized D2.  Mild disease in the mid to distal LAD. CX: Moderate sized vessel, gives origin to a large OM1, OM 2 is very small, there is a lesion after OM 2 at 80% focal lesion.  OM 3 is moderate sized with secondary branches. RCA: Dominant.  Proximal segment 90% stenosis, tandem 30 to 40% stenosis in the midsegment followed by a high-grade 90% stenosis.  Mild disease in the distal right coronary.   Intervention: Successful 2 overlapping stent implantation with 3.0 x 24 mm followed by 3.0 x 16 mm Synergy XD stents, stenosis reduced from 99% to 0%, TIMI I improved to TIMI-3 flow.   Recommendation: Patient will need relook at the LAD stent in view of severe spasm and cardiogenic shock and need for pressor support in the cardiac catheterization lab.  Patient was started on Levophed due to low blood pressure and intra-aortic balloon pump was inserted percutaneously prior to  angioplasty.  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  115 mL contrast utilized.  Echocardiogram 09/16/2020:  1. Left ventricular ejection fraction, by estimation, is 20 to 25%. Left ventricular ejection fraction by PLAX is 35 %. The left ventricle has severely decreased function. The left ventricle demonstrates regional wall motion abnormalities (see scoring diagram/findings for description). Left ventricular diastolic function could not be evaluated. There is akinesis of the left ventricular, entire anterolateral wall, anterior wall and apical segment. There  is akinesis of the left ventricular, entire apical segment.  2. Right ventricular systolic function was not well visualized. The right ventricular size is normal.  3. The mitral valve is normal in structure. No evidence of mitral valve regurgitation. No evidence of mitral stenosis.  4. The aortic valve is normal in structure. Aortic valve regurgitation is not visualized. No aortic stenosis is present.  5. There is mild dilatation of the aortic root, measuring 39 mm.  6. The inferior vena cava is dilated in size with <50% respiratory variability, suggesting right atrial pressure of 15 mmHg.   Comparison(s): Compared to 09/11/2020, LVEF is improved marginally from 10 to 15% to the present 25% to 30%.  EKG:   EKG 09/10/2020: Normal sinus rhythm at rate of 83 bpm, normal axis, poor R wave progression, cannot exclude anteroseptal infarct old.  Nonspecific ST-T abnormality.  Compared to 09/15/2020, minimal anterolateral ST elevation not present.  09/07/2020: 1557 hrs.: Ventricular fibrillation. 09/15/2020 at 1550 hrs.: Anteroseptal and high lateral STEMI.   Telemetry: Sinus rhythm with runs of nonsustained ventricular tachycardia 6-8 beats  Scheduled Meds:  aspirin  81 mg Per Tube Daily   atorvastatin  80 mg Per Tube Daily    chlorhexidine gluconate (MEDLINE KIT)  15 mL Mouth Rinse BID   docusate  100 mg Per Tube BID   enoxaparin (LOVENOX) injection  30 mg Subcutaneous Q24H   feeding supplement (PROSource TF)  45 mL Per Tube BID   fentaNYL (SUBLIMAZE) injection  25 mcg Intravenous Once   fiber  1 packet Per Tube BID   folic acid  1 mg Per Tube Daily   free water  300 mL Per Tube Q4H   insulin aspart  0-15 Units Subcutaneous Q4H   mouth rinse  15 mL Mouth Rinse 10 times per day   multivitamin with minerals  1 tablet Per Tube Daily   pantoprazole sodium  40 mg Per Tube Daily   polyethylene glycol  17 g Per Tube Daily   potassium chloride  40 mEq Per Tube BID   sodium chloride flush  10-40 mL Intracatheter Q12H   sodium chloride flush  3 mL Intravenous Q12H   thiamine injection  100 mg Intravenous Daily   ticagrelor  90 mg Per Tube BID   Continuous Infusions:  sodium chloride 10 mL/hr at 09/22/20 0914   cefTRIAXone (ROCEPHIN)  IV 2 g (09/22/20 0916)   dexmedetomidine (PRECEDEX) IV infusion 0.2 mcg/kg/hr (09/22/20 0900)   feeding supplement (VITAL 1.5 CAL) 1,000 mL (09/22/20 0206)   furosemide (LASIX) 200 mg in dextrose 5% 100 mL (91m/mL) infusion Stopped (09/21/20 1616)   PRN Meds:.acetaminophen (TYLENOL) oral liquid 160 mg/5 mL, albuterol, bisacodyl, diphenhydrAMINE, fentaNYL (SUBLIMAZE) injection, Gerhardt's butt cream, nitroGLYCERIN, ondansetron (ZOFRAN) IV, sodium chloride flush, sodium chloride flush  Assessment/Plan:   1.  Cardiogenic shock.  2.  Heart failure with reduced ejection fraction,  Ischemic cardiomyopathy 3.  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  4.  Respiratory failure with hypoxemia 5.  Leukocytosis  Recommendation:   Patient remains in altered mental status with no response to me, except opening his eyes on command, no head motion or leg motion, still in mild respiratory distress off of Precedex.  Vital signs includes tachycardia, tachypnea but no  obvious heart failure signs.  Lung sounds are decreased at the bases and faint crackles but otherwise physical examination unremarkable, there is no JVD, no leg edema.  Rash has subsided since discontinuing furosemide.  Do not have today's labs, continues to have leukocytosis, low-grade temperature, presently on antibiotics.  No obvious clinical signs for any infection either.  From cardiac standpoint, not much to offer as he is unresponsive and does not have any family members, not a candidate for any advanced circulatory assist device placement and given his mental status, do not think tracheostomy would be appropriate.  I have had several contact discussions with his estranged brother as well who also does not want his brother to be on life support and wants him to be on comfort measures if he does not do well.  I will discussed with pulmonary critical care today, if all in agreement, we may have to extubate and see how he does.  I will obtain a BMP today for documentation to see if his renal function is stable or deteriorating.  40 Minutes of critical care time with regards to coordination of care, making decisions regarding end of life situations and management of metabolic abnormality.    Adrian Prows, MD, [facility] 09/22/2020, 10:52 AM Office: [phone] Fax: [phone] Pager: [phone]

## 2020-09-23 MED ORDER — ACETAMINOPHEN 160 MG/5ML PO SOLN
650.0000 mg | Freq: Four times a day (QID) | ORAL | Status: DC | PRN
  Administered 2020-09-23 (×2): 650 mg
  Filled 2020-09-23 (×2): qty 20.3

## 2020-09-24 LAB — GLUCOSE, CAPILLARY
Glucose-Capillary: 178 mg/dL — ABNORMAL HIGH (ref 70–99)
Glucose-Capillary: 205 mg/dL — ABNORMAL HIGH (ref 70–99)

## 2020-10-19 NOTE — Progress Notes (Signed)
Nutrition Brief Note  Chart reviewed. Pt now transitioning to comfort care.  No further nutrition interventions planned at this time.  Please re-consult as needed.   Mckynlie Vanderslice MS, RD, LDN, CNSC Clinical Nutrition Pager listed in AMION   

## 2020-10-19 NOTE — Discharge Summary (Signed)
Physician Discharge Summary  Patient ID: Jesse Lowe MRN: [id number] DOB/AGE: 1951-01-29 70 y.o.  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  Acute ST elevation myocardial infarction (STEMI) due to occlusion of left anterior descending (LAD) coronary artery (HCC) 2.  Cardiogenic shock (HCC) 3.  Ischemic cardiomyopathy with severe LV systolic dysfunction 4.  Acute metabolic encephalopathy and hypoxemic brain injury from cardiogenic shock and brief cardiac arrest. 5.  Acute renal failure (ARF) (HCC) due to acute tubular necrosis from cardiogenic shock 6.  Acute respiratory failure with hypoxia [facility])  Hospital Course: Patient presented to the emergency room on 08/29/2020 with acute onset chest pain 1 hour prior to the admission to the emergency room.  He has not seen a physician in many years.  He was having intense 10/10 chest pain in the emergency room, then developed V. fib cardiac arrest needing resuscitation with ACLS protocol.  Approximately after 5 minutes of CPR, he returned to spontaneous respiration.  He underwent emergent cardiac catheterization and was found to have multivessel coronary artery disease with culprit being LAD.  He was also in cardiogenic shock and had intra-aortic balloon pump placed as well with stabilization of hemodynamics.  The following morning although patient's hemodynamic improved, he remained fairly distant and sluggish and felt that it was probably related to acute hypoxemic brain injury that he would recover.  Intra-aortic balloon pump was withdrawn.  The following day patient became more dyspneic, agitated needing intubation.  He was on the ventilator support along with inotropic support for prolonged  period time, pulmonary critical care was also consulted.  He also developed acute renal failure, malnutrition and continued encephalopathy felt to be hypoxemic brain injury, also metabolic encephalopathy overall related to cardiogenic shock.  In spite of aggressive measures to revive, patient continues to remain very much obtunded and did not follow any commands of significance.  He was also not felt to be a candidate for circulatory mechanical assist device placement in view of altered mental status, having no family members for support.  Eventually able to locate Geordie Nooney, his estranged brother who lives in [place], and after discussions, it was felt that proceeding with palliative care and comfort care would be appropriate if he does not recover.  I also had a telephone consultation with ethics committee on the curbside who felt that in the absence of any surrogates, attending physicians could make the decision.  With extensive discussion between pulmonary critical care and myself, in view of continued cardiogenic shock, pulmonary edema, renal failure, continued altered mental status, it was felt that his chances of meaningful recovery was extremely low especially with regard to quality of care and as his brother had also not wished long-term life support including tracheostomy, ventilatory support, the cath was withdrawn on 09/22/2020 at 8:30 PM.  Patient expired on 2020/09/25 at 1343 hrs.  Consults: Pulmonary critical care, nutritional care, pharmacy.  Please see my progress note for complete details of the investigations performed and hospitalization.    Yates Decamp, MD, [facility] 09/25/2020, 8:31 PM Office: [phone] Fax: [phone] Pager: [phone]

## 2020-10-19 NOTE — Progress Notes (Signed)
  Patient transitioned to full comfort care 7/5.  Remains on morphine gtt, currently at 15mg /hr, per CCM comfort care protocol.   Currently remains unresponsive with cheyne stokes breathing pattern, however HR and O2 sats (70-80's) have remained stable overnight.    Discussed with Dr. , will transfer patient to palliative floor.  Discussed with Melody, with PMT.  PMT consulted and will see/ manage palliation/ symptom management for end of life.    PCCM will sign off and be available as needed.       Jacinto Halim, ACNP [facility] 10/02/2020, 10:17 AM

## 2020-10-19 DEATH — deceased

## 2022-11-26 IMAGING — DX DG CHEST 1V PORT
1 series · 1 of 1 positions shown · non-contrast
Comparison: Chest radiograph dated 09/07/2020.

CLINICAL DATA: 69-year-old male with shortness of breath.

EXAM:
PORTABLE CHEST 1 VIEW

[chest]
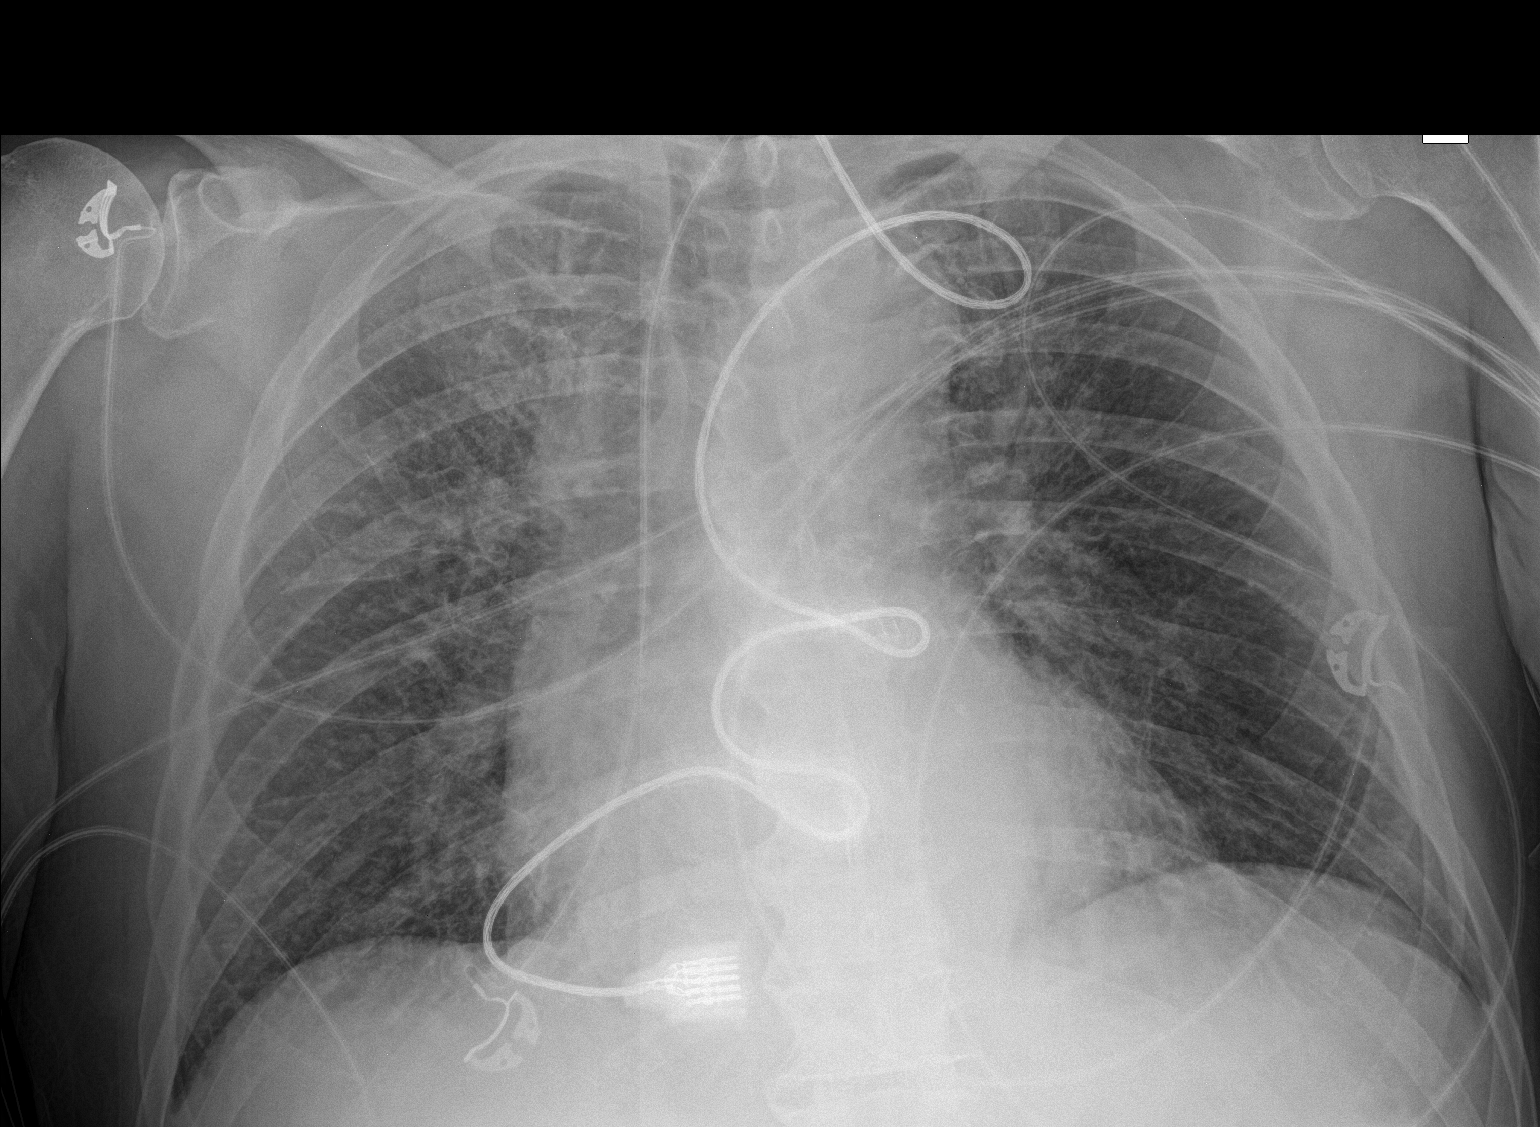

[1 of 1 positions shown; findings below may reference images not displayed]

FINDINGS: Interval removal of the previously seen intra-aortic balloon pump.
There is cardiomegaly with mild vascular congestion and edema,
progressed since the prior radiograph. Atypical pneumonia is not
excluded clinical correlation is recommended. No focal
consolidation, pleural effusion, or pneumothorax. Degenerative
changes of the spine. No acute osseous pathology.
IMPRESSION: Cardiomegaly with mild vascular congestion and edema. Atypical
pneumonia is not excluded. No focal consolidation.

## 2022-12-06 IMAGING — DX DG CHEST 1V PORT
1 series · 1 of 1 positions shown · non-contrast
Comparison: 09/16/2020.

CLINICAL DATA: Intubation.  Code STEMI.

EXAM:
PORTABLE CHEST 1 VIEW

[chest]
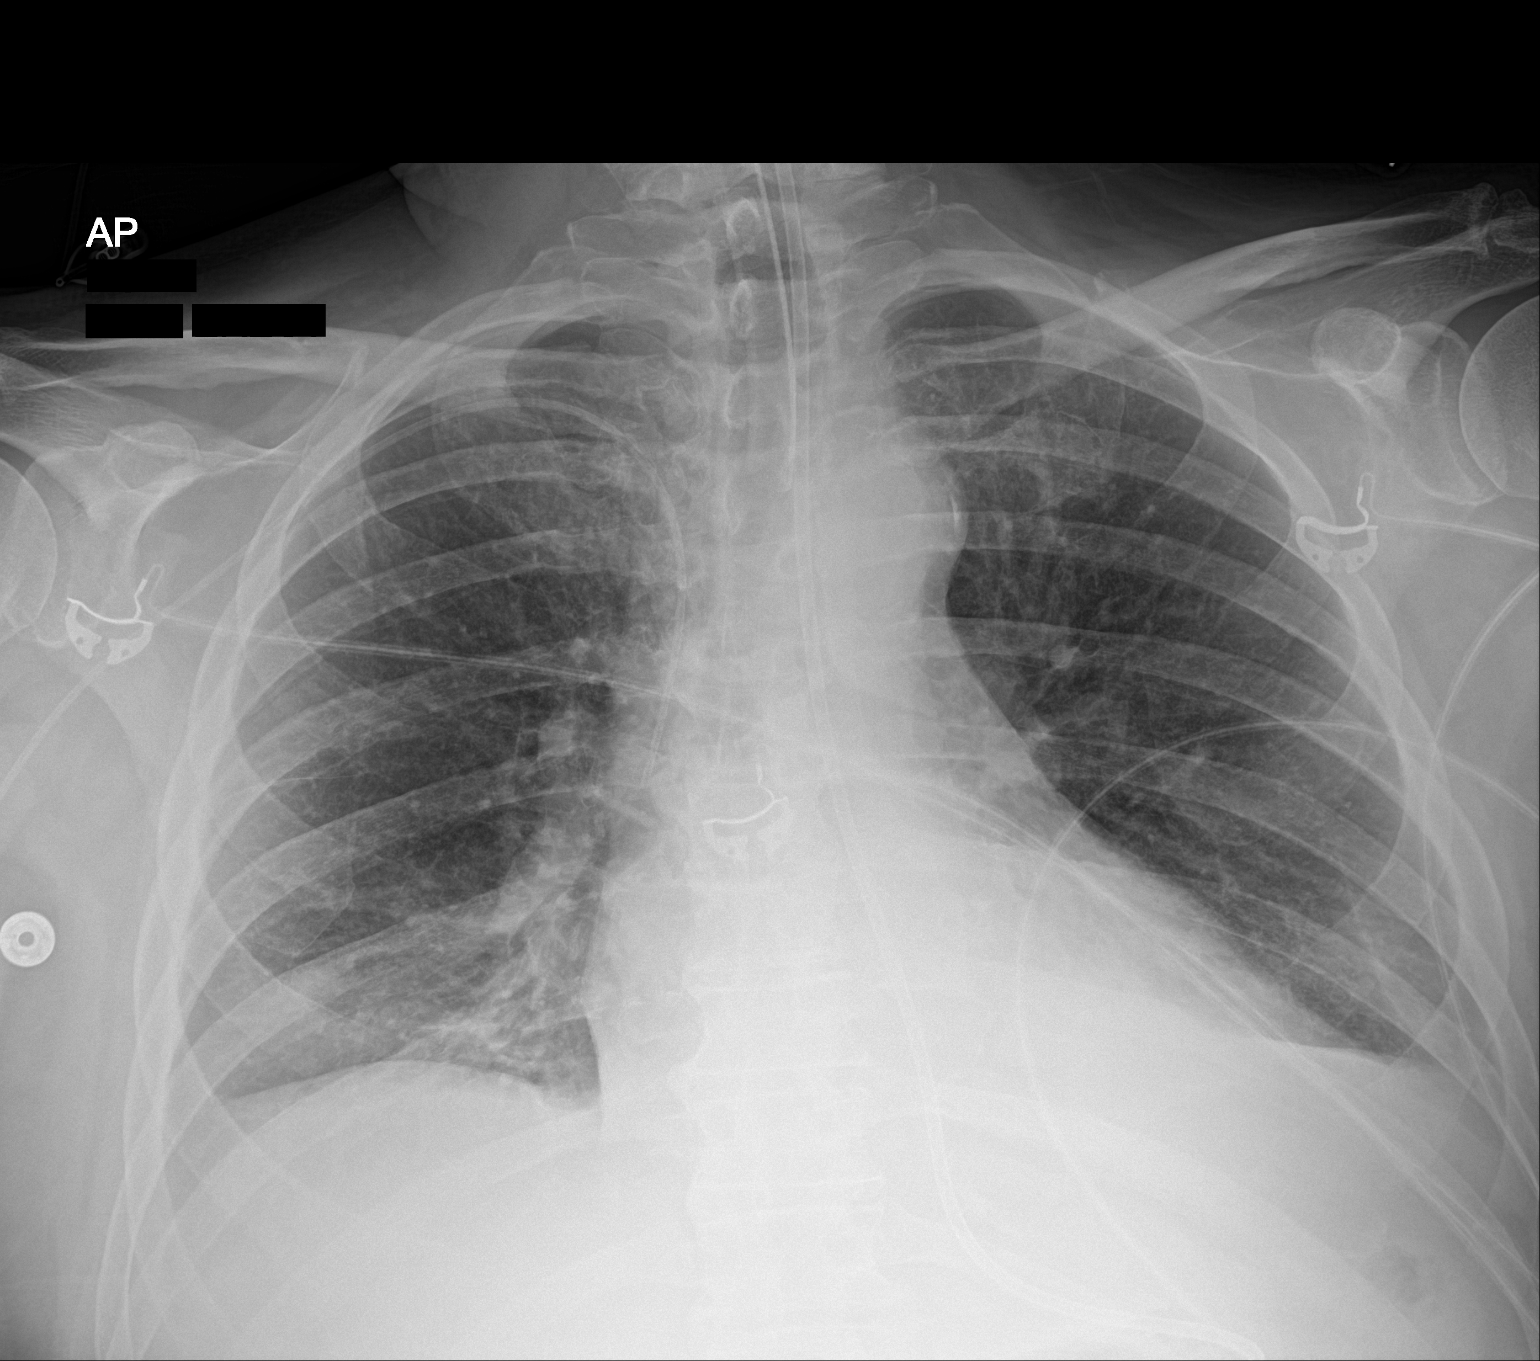

[1 of 1 positions shown; findings below may reference images not displayed]

FINDINGS: Interim removal of left IJ line. Interim placement of right PICC
line, tip over superior vena cava. Interim placement of feeding
tube, tip below left hemidiaphragm. Endotracheal tube in stable
position. Cardiomegaly. Interim near complete resolution of
bilateral pulmonary infiltrates/edema. Persistent bibasilar
atelectasis. Tiny bilateral pleural effusions cannot be excluded. No
pneumothorax.
IMPRESSION: 1. Interim removal of left IJ line. Interim placement of right PICC
line, tip at the SVC. Interim placement of feeding tube, tip below
left hemidiaphragm. Endotracheal tube in stable position.

2. Interim near complete resolution of bilateral pulmonary
infiltrates/edema. Persistent bibasilar atelectasis. Tiny bilateral
pleural effusions cannot be excluded.
